# Patient Record
Sex: Female | Born: 1955 | Race: Black or African American | Hispanic: No | Smoking: Former smoker
Health system: Southern US, Community
[De-identification: ages and names within clinical notes are randomized; demographics above are authoritative.]

## PROBLEM LIST (undated history)

## (undated) DIAGNOSIS — I739 Peripheral vascular disease, unspecified: Secondary | ICD-10-CM

## (undated) DIAGNOSIS — E785 Hyperlipidemia, unspecified: Secondary | ICD-10-CM

## (undated) DIAGNOSIS — I471 Supraventricular tachycardia, unspecified: Secondary | ICD-10-CM

## (undated) DIAGNOSIS — I499 Cardiac arrhythmia, unspecified: Secondary | ICD-10-CM

## (undated) DIAGNOSIS — I6523 Occlusion and stenosis of bilateral carotid arteries: Secondary | ICD-10-CM

## (undated) DIAGNOSIS — I70239 Atherosclerosis of native arteries of right leg with ulceration of unspecified site: Secondary | ICD-10-CM

## (undated) DIAGNOSIS — N12 Tubulo-interstitial nephritis, not specified as acute or chronic: Secondary | ICD-10-CM

## (undated) DIAGNOSIS — I998 Other disorder of circulatory system: Secondary | ICD-10-CM

## (undated) DIAGNOSIS — R55 Syncope and collapse: Secondary | ICD-10-CM

## (undated) DIAGNOSIS — I70269 Atherosclerosis of native arteries of extremities with gangrene, unspecified extremity: Secondary | ICD-10-CM

## (undated) DIAGNOSIS — R002 Palpitations: Secondary | ICD-10-CM

## (undated) DIAGNOSIS — I10 Essential (primary) hypertension: Secondary | ICD-10-CM

## (undated) HISTORY — PX: ABDOMINAL HYSTERECTOMY: SHX81

## (undated) HISTORY — PX: PARTIAL HYSTERECTOMY: SHX80

---

## 2005-07-21 ENCOUNTER — Emergency Department: Payer: Self-pay | Admitting: General Practice

## 2016-03-07 DIAGNOSIS — R0789 Other chest pain: Secondary | ICD-10-CM | POA: Insufficient documentation

## 2016-03-07 DIAGNOSIS — I10 Essential (primary) hypertension: Secondary | ICD-10-CM | POA: Insufficient documentation

## 2016-03-07 DIAGNOSIS — R55 Syncope and collapse: Secondary | ICD-10-CM | POA: Insufficient documentation

## 2016-05-30 ENCOUNTER — Other Ambulatory Visit: Payer: Self-pay | Admitting: Family Medicine

## 2016-05-30 DIAGNOSIS — Z1231 Encounter for screening mammogram for malignant neoplasm of breast: Secondary | ICD-10-CM

## 2017-01-09 ENCOUNTER — Encounter: Payer: Self-pay | Admitting: Emergency Medicine

## 2017-01-09 ENCOUNTER — Emergency Department
Admission: EM | Admit: 2017-01-09 | Discharge: 2017-01-09 | Disposition: A | Discharge status: Home / Self Care | Payer: Self-pay | Attending: Emergency Medicine | Admitting: Emergency Medicine

## 2017-01-09 ENCOUNTER — Emergency Department: Payer: Self-pay

## 2017-01-09 DIAGNOSIS — I10 Essential (primary) hypertension: Secondary | ICD-10-CM | POA: Insufficient documentation

## 2017-01-09 DIAGNOSIS — Y939 Activity, unspecified: Secondary | ICD-10-CM | POA: Insufficient documentation

## 2017-01-09 DIAGNOSIS — Y999 Unspecified external cause status: Secondary | ICD-10-CM | POA: Insufficient documentation

## 2017-01-09 DIAGNOSIS — S62307A Unspecified fracture of fifth metacarpal bone, left hand, initial encounter for closed fracture: Secondary | ICD-10-CM | POA: Insufficient documentation

## 2017-01-09 DIAGNOSIS — W51XXXA Accidental striking against or bumped into by another person, initial encounter: Secondary | ICD-10-CM | POA: Insufficient documentation

## 2017-01-09 DIAGNOSIS — F17210 Nicotine dependence, cigarettes, uncomplicated: Secondary | ICD-10-CM | POA: Insufficient documentation

## 2017-01-09 DIAGNOSIS — Y929 Unspecified place or not applicable: Secondary | ICD-10-CM | POA: Insufficient documentation

## 2017-01-09 HISTORY — DX: Essential (primary) hypertension: I10

## 2017-01-09 HISTORY — DX: Syncope and collapse: R55

## 2017-01-09 MED ORDER — IBUPROFEN 600 MG PO TABS: 600.0000 mg | ORAL_TABLET | Freq: Four times a day (QID) | ORAL | 0 refills | Status: DC | PRN
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
## 2017-02-15 DIAGNOSIS — S62338A Displaced fracture of neck of other metacarpal bone, initial encounter for closed fracture: Secondary | ICD-10-CM | POA: Insufficient documentation

## 2018-07-03 IMAGING — CR DG HAND COMPLETE 3+V*L*
1 series · 3 of 3 positions shown · non-contrast
Comparison: None.

CLINICAL DATA: Left hand pain after injury 2 days ago.

EXAM:
LEFT HAND - COMPLETE 3+ VIEW

[Series 1: x hand pa left · 0.14mm/px · 3 of 3 slices shown]
[im 1/3]
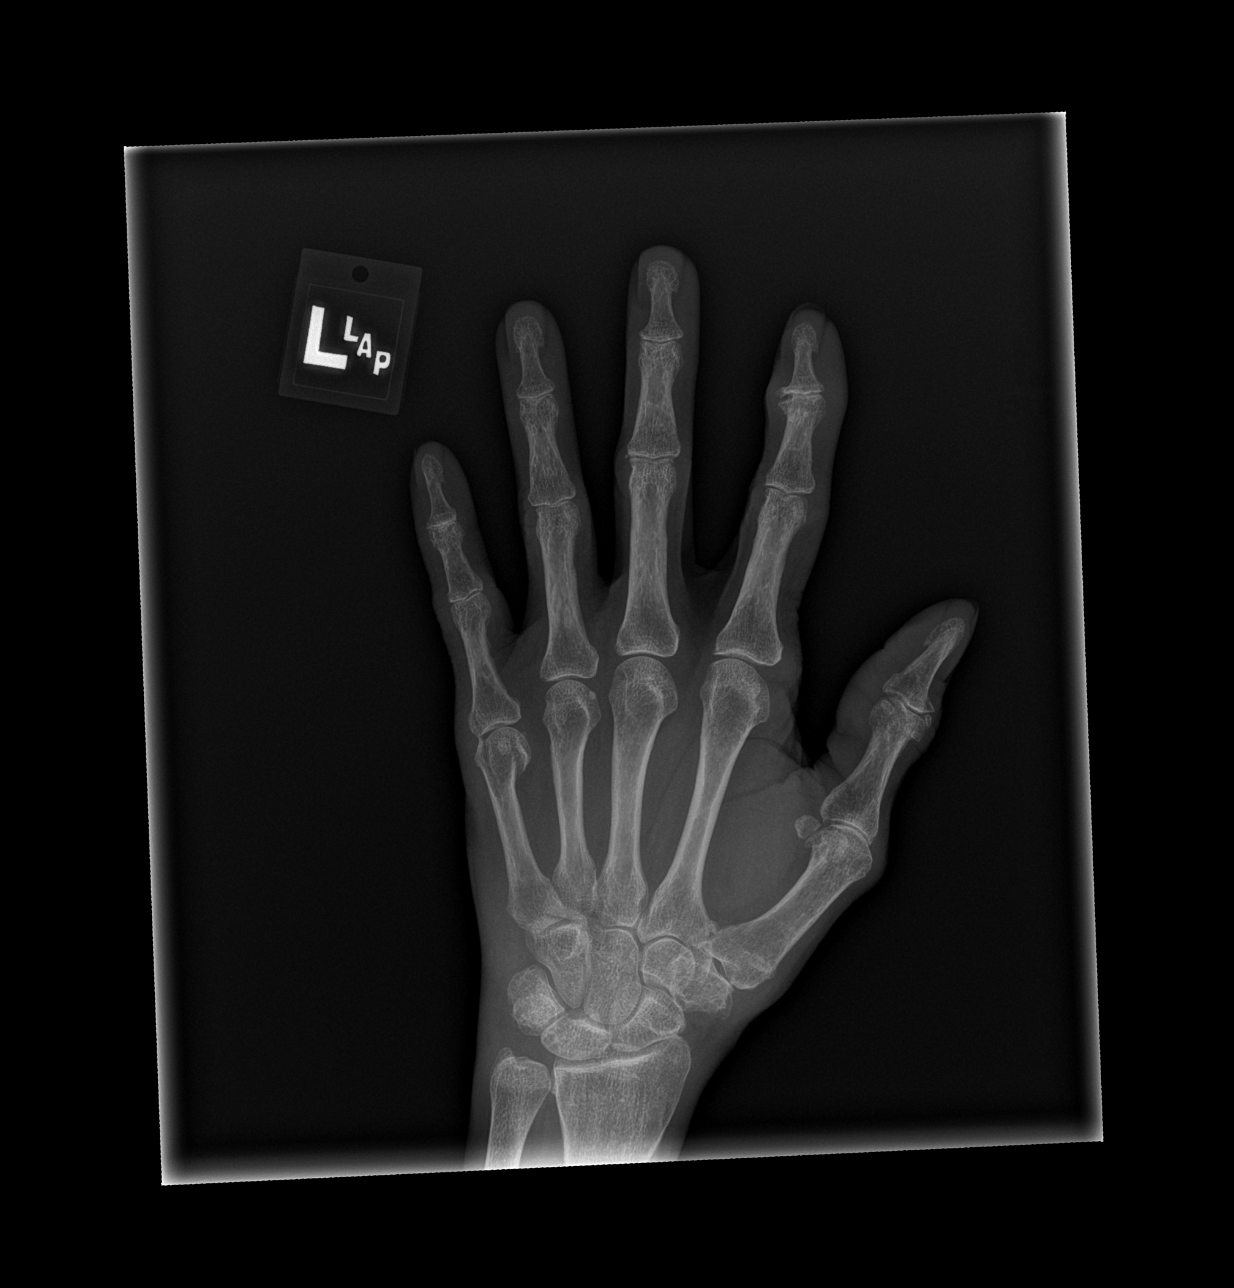
[im 2/3]
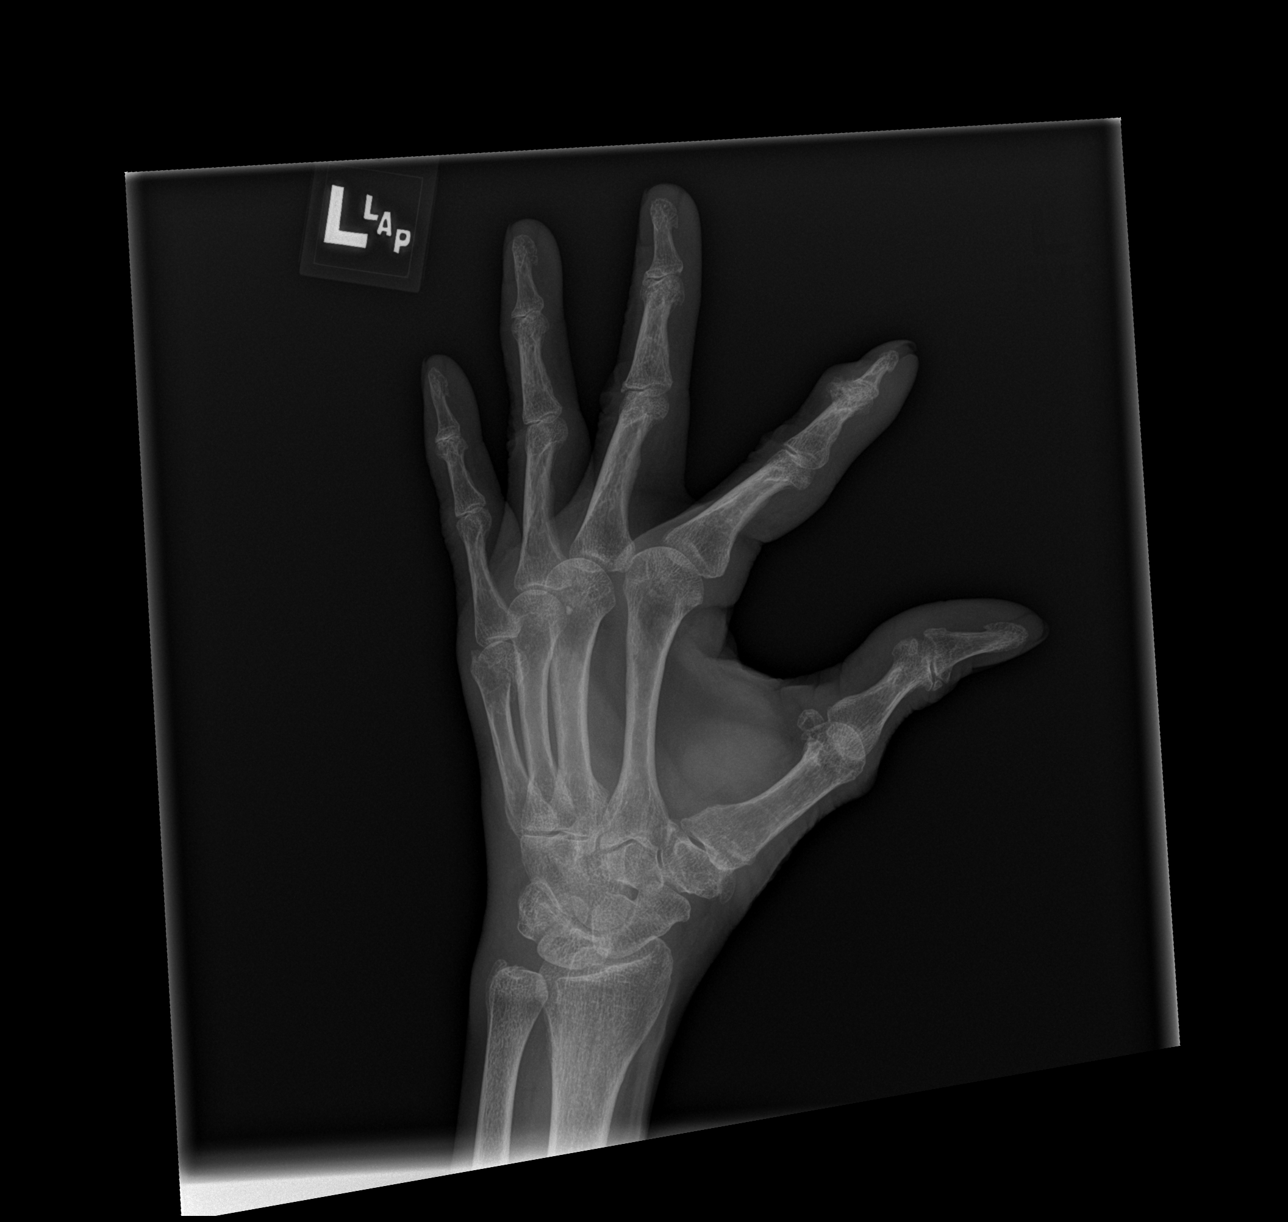
[im 3/3]
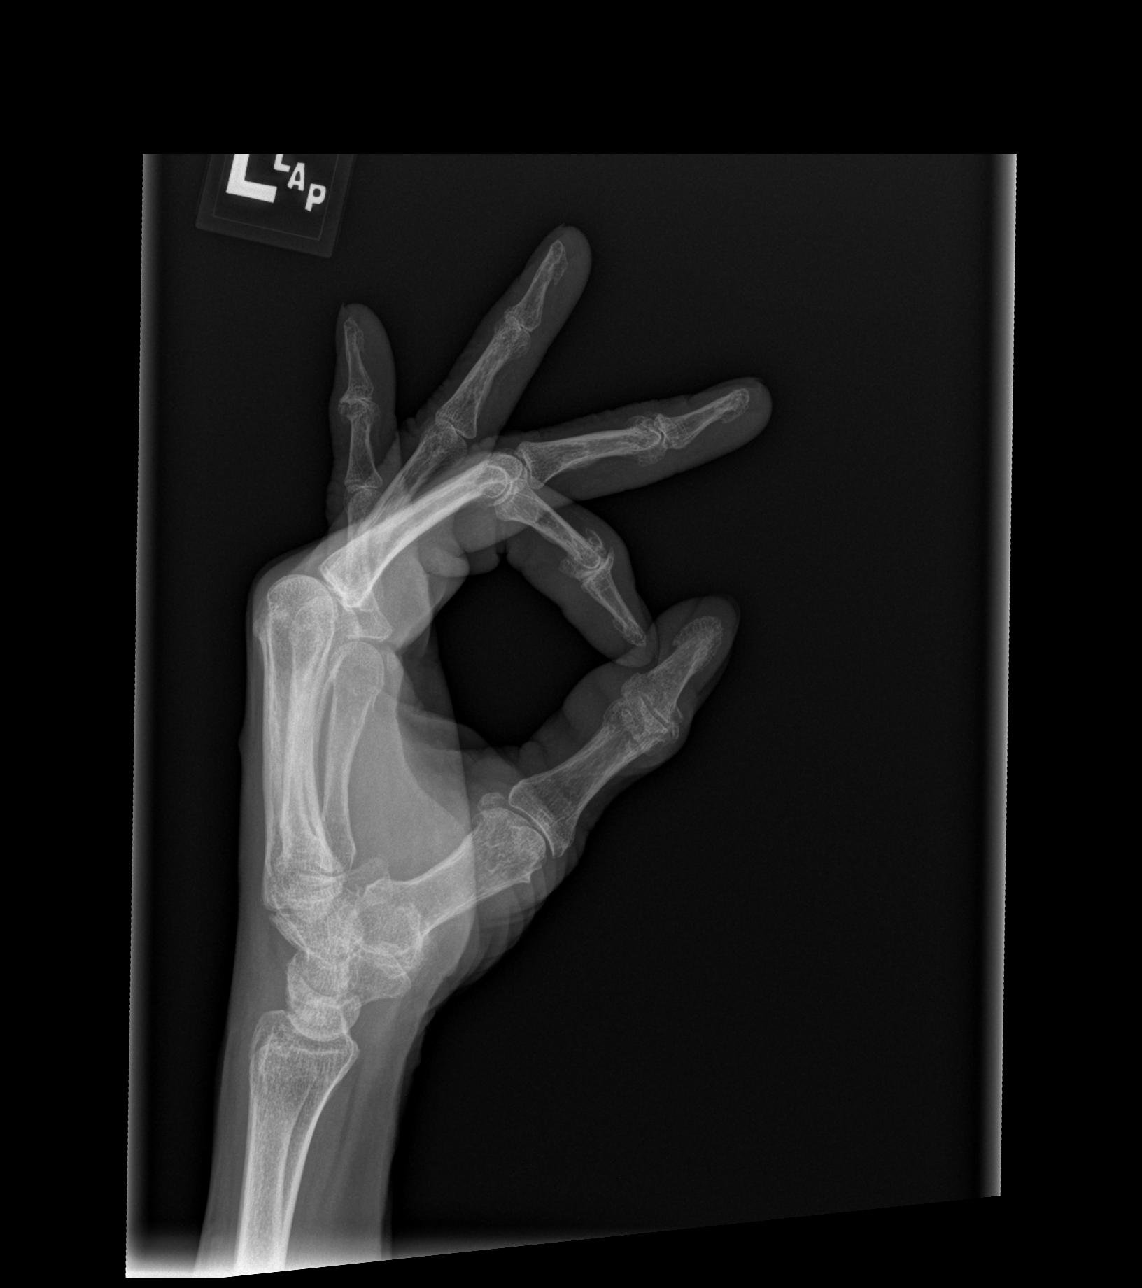

[3 of 3 positions shown; findings below may reference images not displayed]

FINDINGS: Moderately displaced fracture is seen involving the distal portion
of the fifth metacarpal. Narrowing and osteophyte formation is seen
involving the distal interphalangeal joint of the index finger
suggesting osteoarthritis. No soft tissue abnormality is noted.
IMPRESSION: Moderately displaced distal fifth metacarpal fracture.

## 2019-09-05 ENCOUNTER — Other Ambulatory Visit: Payer: Self-pay

## 2019-09-05 ENCOUNTER — Emergency Department
Admission: EM | Admit: 2019-09-05 | Discharge: 2019-09-05 | Disposition: A | Discharge status: Home / Self Care | Payer: Medicare Other | Attending: Emergency Medicine | Admitting: Emergency Medicine

## 2019-09-05 DIAGNOSIS — I10 Essential (primary) hypertension: Secondary | ICD-10-CM | POA: Insufficient documentation

## 2019-09-05 DIAGNOSIS — R55 Syncope and collapse: Secondary | ICD-10-CM | POA: Insufficient documentation

## 2019-09-05 DIAGNOSIS — Z79899 Other long term (current) drug therapy: Secondary | ICD-10-CM | POA: Diagnosis not present

## 2019-09-05 DIAGNOSIS — F17210 Nicotine dependence, cigarettes, uncomplicated: Secondary | ICD-10-CM | POA: Insufficient documentation

## 2019-09-05 LAB — BASIC METABOLIC PANEL
Anion gap: 8 (ref 5–15)
BUN: 10 mg/dL (ref 8–23)
CO2: 27 mmol/L (ref 22–32)
Calcium: 8.8 mg/dL — ABNORMAL LOW (ref 8.9–10.3)
Chloride: 103 mmol/L (ref 98–111)
Creatinine, Ser: 0.52 mg/dL (ref 0.44–1.00)
GFR calc Af Amer: >60 mL/min (ref >60)
GFR calc non Af Amer: >60 mL/min (ref >60)
Glucose, Bld: 156 mg/dL — ABNORMAL HIGH (ref 70–99)
Potassium: 3.2 mmol/L — ABNORMAL LOW (ref 3.5–5.1)
Sodium: 138 mmol/L (ref 135–145)

## 2019-09-05 LAB — CBC
HCT: 42 % (ref 36.0–46.0)
Hemoglobin: 14 g/dL (ref 12.0–15.0)
MCH: 33.4 pg (ref 26.0–34.0)
MCHC: 33.3 g/dL (ref 30.0–36.0)
MCV: 100.2 fL — ABNORMAL HIGH (ref 80.0–100.0)
Platelets: 275 10*3/uL (ref 150–400)
RBC: 4.19 MIL/uL (ref 3.87–5.11)
RDW: 14.5 % (ref 11.5–15.5)
WBC: 6.5 10*3/uL (ref 4.0–10.5)
nRBC: 0 % (ref 0.0–0.2)

## 2019-09-05 MED ORDER — POTASSIUM CHLORIDE 20 MEQ/15ML (10%) PO SOLN
40.0000 meq | Freq: Once | ORAL | Status: AC
  Administered 2019-09-05: 08:00:00 40 meq via ORAL
  Filled 2019-09-05: qty 30

## 2019-09-05 MED ORDER — SODIUM CHLORIDE 0.9 % IV BOLUS
500.0000 mL | Freq: Once | INTRAVENOUS | Status: AC
  Administered 2019-09-05: 500 mL via INTRAVENOUS

## 2019-09-05 NOTE — Discharge Instructions (Addendum)

## 2019-09-05 NOTE — ED Provider Notes (Signed)
[facility] Emergency Department Provider Note   ____________________________________________   First MD Initiated Contact with Patient 09/05/19 [phone]     (approximate)  I have reviewed the triage vital signs and the nursing notes.   HISTORY  Chief Complaint Loss of Consciousness    HPI Yvonne [name] is a 64 y.o. female at about 1 AM, she got up from her chair to use the restroom as she was in the restroom she got lightheaded and passed out briefly.  She reports she did not injure her head.  She  did suffer a small abrasion to her right knee.  She came in today as this is happened a few times in the past and she reports she is like to figure out what might be causing it  She has had a few episodes over the distant past where she is passed out just the same.  She woke up on the floor oriented today.  She reports that she drank about 2-1/2 beers in her chair, then got up to use the bathroom and became lightheaded and passed out  There is no chest pain no recent illness no fevers no chills.  She is seeing her doctor who is ordered an ultrasound that she had done yesterday due to some thickening of lymph nodes or other on the left side of her neck.  No headaches.  No injuries except abrasion on the right knee.  Past Medical History:  Diagnosis Date  . Hypertension   . Syncope     There are no problems to display for this patient.   No past surgical history on file.  Prior to Admission medications   Medication Sig Start Date End Date Taking? Authorizing Provider  hydrochlorothiazide (HYDRODIURIL) 25 MG tablet Take 25 mg by mouth daily.    [provider]  ibuprofen (ADVIL,MOTRIN) 600 MG tablet Take 1 tablet (600 mg total) by mouth every 6 (six) hours as needed. 01/09/17   Enid Derry, PA-C    Allergies Patient has no known allergies.  No family history on file.  Social History Social History   Tobacco Use  . Smoking status: Current  Every Day Smoker    Packs/day: 1.00    Types: Cigarettes  Substance Use Topics  . Alcohol use: Not on file  . Drug use: Not on file  Does use alcohol occasionally, about 2-1/2 beers last night before bed  Review of Systems Constitutional: No fever/chills Eyes: No visual changes. ENT: No sore throat.  No neck pain except over the left side of her neck where she has an area of that her doctors been checking out and they did an ultrasound of it yesterday Cardiovascular: Denies chest pain. Respiratory: Denies shortness of breath. Gastrointestinal: No abdominal pain.   Genitourinary: Negative for dysuria. Musculoskeletal: Negative for back pain. Skin: Negative for rash. Neurological: Negative for headaches, areas of focal weakness or numbness.  She does however occasionally get left-sided headaches that her doctor is aware of not currently having any.    ____________________________________________   PHYSICAL EXAM:  VITAL SIGNS: ED Triage Vitals  Enc Vitals Group     BP 09/05/19 0650 (!) 157/79     Pulse Rate 09/05/19 0650 64     Resp 09/05/19 0650 18     Temp 09/05/19 0650 98.4 F (36.9 C)     Temp Source 09/05/19 0650 Oral     SpO2 09/05/19 0650 100 %     Weight 09/05/19 0652 113 lb (51.3  kg)     Height 09/05/19 0652 5\' 3"  (1.6 m)     Head Circumference --      Peak Flow --      Pain Score 09/05/19 0652 2     Pain Loc --      Pain Edu? --      Excl. in [place]? --     Constitutional: Alert and oriented. Well appearing and in no acute distress. Eyes: Conjunctivae are normal. Head: Atraumatic.  She does have a slightly firm nodular region over the left lateral neck question if it could be a lymph node, but nonetheless the patient reports has been there for some time and her doctor is evaluating it and actually had an ultrasound of the area yesterday for which she supposed to get the results this coming week.  No midline cervical tenderness Nose: No  congestion/rhinnorhea. Mouth/Throat: Mucous membranes are slightly dry. Neck: No stridor.  Cardiovascular: Normal rate, regular rhythm. Grossly normal heart sounds.  Good peripheral circulation. Respiratory: Normal respiratory effort.  No retractions. Lungs CTAB. Gastrointestinal: Soft and nontender. No distention. Musculoskeletal: No lower extremity tenderness nor edema. Neurologic:  Normal speech and language. No gross focal neurologic deficits are appreciated.  Skin:  Skin is warm, dry and intact. No rash noted. Psychiatric: Mood and affect are normal. Speech and behavior are normal.  ____________________________________________   LABS (all labs ordered are listed, but only abnormal results are displayed)  Labs Reviewed  BASIC METABOLIC PANEL - Abnormal; Notable for the following components:      Result Value   Potassium 3.2 (*)    Glucose, Bld 156 (*)    Calcium 8.8 (*)    All other components within normal limits  CBC - Abnormal; Notable for the following components:   MCV 100.2 (*)    All other components within normal limits  URINALYSIS, COMPLETE (UACMP) WITH MICROSCOPIC   ____________________________________________  EKG  Reviewed interprinted by me at 7 AM Heart rate 65 QRs 99 QTc 470 Normal sinus rhythm, possible mild LVH.  No evidence of acute ischemia. ____________________________________________  RADIOLOGY  No results found.   ____________________________________________   PROCEDURES  Procedure(s) performed: 3-lead  .1-3 Lead EKG Interpretation Performed by: Delman Kitten, MD Authorized by: Delman Kitten, MD     Interpretation: normal     ECG rate:  70   ECG rate assessment: normal     Rhythm: sinus rhythm     Ectopy: none     Conduction: normal      Critical Care performed: No  ____________________________________________   INITIAL IMPRESSION / ASSESSMENT AND PLAN / ED COURSE  Pertinent labs & imaging results that were available during  my care of the patient were reviewed by me and considered in my medical decision making (see chart for details).   Patient returns for evaluation after passing out.  Patient's is in the context of getting up to urinate after drinking 2 or 3 beers last night.  I suspect based on the clinical history she gives that likely some element of vasovagal or dehydration is involved.  She does report she is had a few other syncopal episodes always occurring after drinking a couple of beers.  She does not drink daily though  She denies any associated chest pain.  There was no head injury.  No numbness no weakness no associated neurologic symptoms.  Very reassuring clinical examination at this time.  No evidence of ACS by clinical exam or history and I do not believe  a troponin would be beneficial given lack of any cardiac symptoms    ----------------------------------------- 9:54 AM on 09/05/2019 -----------------------------------------  Patient ambulatory without distress.  Feels well no ongoing lightheadedness.  No symptoms at this time.  Return precautions and treatment recommendations and follow-up discussed with the patient who is agreeable with the plan.  Patient agreeable to follow-up with her primary and also my recommendation to follow-up with cardiology for further evaluation including possible heart monitoring ____________________________________________   FINAL CLINICAL IMPRESSION(S) / ED DIAGNOSES  Final diagnoses:  Syncope and collapse        Note:  This document was prepared using Dragon voice recognition software and may include unintentional dictation errors       Sharyn Creamer, MD 09/05/19 [phone]

## 2019-09-05 NOTE — ED Triage Notes (Signed)
Patient reports passed out this morning.  Reports history of this but has never been told what causes it.

## 2019-10-05 DIAGNOSIS — I10 Essential (primary) hypertension: Secondary | ICD-10-CM | POA: Insufficient documentation

## 2019-11-12 DIAGNOSIS — R002 Palpitations: Secondary | ICD-10-CM | POA: Insufficient documentation

## 2019-11-12 DIAGNOSIS — I471 Supraventricular tachycardia: Secondary | ICD-10-CM | POA: Insufficient documentation

## 2019-11-12 DIAGNOSIS — I6523 Occlusion and stenosis of bilateral carotid arteries: Secondary | ICD-10-CM | POA: Insufficient documentation

## 2019-12-02 ENCOUNTER — Encounter: Payer: Self-pay | Admitting: Emergency Medicine

## 2019-12-02 ENCOUNTER — Emergency Department
Admission: EM | Admit: 2019-12-02 | Discharge: 2019-12-02 | Disposition: A | Discharge status: Home / Self Care | Payer: Medicare Other | Attending: Emergency Medicine | Admitting: Emergency Medicine

## 2019-12-02 ENCOUNTER — Emergency Department: Payer: Medicare Other

## 2019-12-02 ENCOUNTER — Other Ambulatory Visit: Payer: Self-pay

## 2019-12-02 DIAGNOSIS — Y999 Unspecified external cause status: Secondary | ICD-10-CM | POA: Insufficient documentation

## 2019-12-02 DIAGNOSIS — Y929 Unspecified place or not applicable: Secondary | ICD-10-CM | POA: Diagnosis not present

## 2019-12-02 DIAGNOSIS — R55 Syncope and collapse: Secondary | ICD-10-CM | POA: Diagnosis not present

## 2019-12-02 DIAGNOSIS — I10 Essential (primary) hypertension: Secondary | ICD-10-CM | POA: Insufficient documentation

## 2019-12-02 DIAGNOSIS — Y939 Activity, unspecified: Secondary | ICD-10-CM | POA: Insufficient documentation

## 2019-12-02 DIAGNOSIS — W19XXXA Unspecified fall, initial encounter: Secondary | ICD-10-CM | POA: Diagnosis not present

## 2019-12-02 DIAGNOSIS — S0990XA Unspecified injury of head, initial encounter: Secondary | ICD-10-CM | POA: Diagnosis present

## 2019-12-02 DIAGNOSIS — S0081XA Abrasion of other part of head, initial encounter: Secondary | ICD-10-CM | POA: Diagnosis not present

## 2019-12-02 DIAGNOSIS — F17210 Nicotine dependence, cigarettes, uncomplicated: Secondary | ICD-10-CM | POA: Diagnosis not present

## 2019-12-02 LAB — BASIC METABOLIC PANEL WITH GFR
Anion gap: 10 (ref 5–15)
BUN: 14 mg/dL (ref 8–23)
CO2: 28 mmol/L (ref 22–32)
Calcium: 9 mg/dL (ref 8.9–10.3)
Chloride: 98 mmol/L (ref 98–111)
Creatinine, Ser: 0.61 mg/dL (ref 0.44–1.00)
GFR calc Af Amer: >60 mL/min
GFR calc non Af Amer: >60 mL/min
Glucose, Bld: 107 mg/dL — ABNORMAL HIGH (ref 70–99)
Potassium: 4.1 mmol/L (ref 3.5–5.1)
Sodium: 136 mmol/L (ref 135–145)

## 2019-12-02 LAB — CBC
HCT: 38.1 % (ref 36.0–46.0)
Hemoglobin: 13.1 g/dL (ref 12.0–15.0)
MCH: 34.2 pg — ABNORMAL HIGH (ref 26.0–34.0)
MCHC: 34.4 g/dL (ref 30.0–36.0)
MCV: 99.5 fL (ref 80.0–100.0)
Platelets: 266 K/uL (ref 150–400)
RBC: 3.83 MIL/uL — ABNORMAL LOW (ref 3.87–5.11)
RDW: 13.2 % (ref 11.5–15.5)
WBC: 6.1 K/uL (ref 4.0–10.5)
nRBC: 0 % (ref 0.0–0.2)

## 2019-12-02 MED ORDER — SODIUM CHLORIDE 0.9% FLUSH: 3.0000 mL | Freq: Once | INTRAVENOUS | Status: DC

## 2019-12-02 NOTE — ED Triage Notes (Signed)
C/O left sided headache since Saturday.  Patient states she had a syncopal episode Saturday and hit head.  C/O headache since fall.  Patient states she has history of syncope.  Patient is AAOx3.  Skin warm and dry.  MAE equally and strong.  Reports history of left sided headaches as well.  Speech clear.Marland Kitchen NAD

## 2019-12-02 NOTE — ED Notes (Signed)
Patient transported to CT 

## 2019-12-02 NOTE — ED Notes (Signed)
Pt states she had a syncopal episode in her gravel driveway Saturday and has abrasions to her nose and lac to inside of lower lip, states she has been having a HA since the fall. Denies any dizziness or other sx. States she has a hx of syncope and it seems to be more frequent, states she see a doctor about the episodes. Pt is a/ox4 on arrival.

## 2019-12-02 NOTE — ED Provider Notes (Signed)
[facility] Emergency Department Provider Note   ____________________________________________    I have reviewed the triage vital signs and the nursing notes.   HISTORY  Chief Complaint Headache     HPI Yvonne [name] is a 64 y.o. female who presents with complaints of headache.  Patient reports she had a syncopal episode several days ago and fell and hit the front of her head and face.  Patient reports she has been having syncopal episodes for years that seem to come out of the blue.  She has been evaluated by cardiology for this in the past with no clear etiology.  She is here for evaluation because of frontal mild headache, she did suffer some bruising and abrasions to her face and nose.  She is not on blood thinners.  No neuro deficits.  Past Medical History:  Diagnosis Date  . Hypertension   . Syncope     There are no problems to display for this patient.   History reviewed. No pertinent surgical history.  Prior to Admission medications   Medication Sig Start Date End Date Taking? Authorizing Provider  hydrochlorothiazide (HYDRODIURIL) 25 MG tablet Take 25 mg by mouth daily.    [provider]  ibuprofen (ADVIL,MOTRIN) 600 MG tablet Take 1 tablet (600 mg total) by mouth every 6 (six) hours as needed. 01/09/17   Enid Derry, PA-C     Allergies Patient has no known allergies.  No family history on file.  Social History Social History   Tobacco Use  . Smoking status: Current Every Day Smoker    Packs/day: 1.00    Types: Cigarettes  . Smokeless tobacco: Never Used  Substance Use Topics  . Alcohol use: Not on file  . Drug use: Not on file    Review of Systems  Constitutional: No fever/chills Eyes: No visual changes.  ENT: No neck pain Cardiovascular: Denies chest pain. Respiratory: Denies shortness of breath. Gastrointestinal: No abdominal pain.  No nausea, no vomiting.   Genitourinary: Negative for  dysuria. Musculoskeletal: Negative for back pain. Skin: Abrasions to the face Neurological: Negative for headaches or weakness   ____________________________________________   PHYSICAL EXAM:  VITAL SIGNS: ED Triage Vitals  Enc Vitals Group     BP 12/02/19 1053 (!) 143/60     Pulse Rate 12/02/19 1053 (!) 54     Resp 12/02/19 1053 16     Temp 12/02/19 1053 98.6 F (37 C)     Temp Source 12/02/19 1053 Oral     SpO2 12/02/19 1053 98 %     Weight --      Height --      Head Circumference --      Peak Flow --      Pain Score 12/02/19 1050 4     Pain Loc --      Pain Edu? --      Excl. in GC? --     Constitutional: Alert and oriented.   Nose: Nasal bones are aligned, abrasion to the left bridge of the nose, no septal hematoma Mouth/Throat: Mucous membranes are moist.   Neck: No vertebral has palpation Cardiovascular: Normal rate, regular rhythm. Peri [place] peripheral circulation. Respiratory: Normal respiratory effort.  No retractions. Gastrointestinal: Soft and nontender. No distention.    Musculoskeletal: No lower extremity tenderness nor edema.  Warm and well perfused Neurologic:  Normal speech and language. No gross focal neurologic deficits are appreciated.  Skin:  Skin is warm, dry  Psychiatric: Mood  and affect are normal. Speech and behavior are normal.  ____________________________________________   LABS (all labs ordered are listed, but only abnormal results are displayed)  Labs Reviewed  BASIC METABOLIC PANEL - Abnormal; Notable for the following components:      Result Value   Glucose, Bld 107 (*)    All other components within normal limits  CBC - Abnormal; Notable for the following components:   RBC 3.83 (*)    MCH 34.2 (*)    All other components within normal limits  URINALYSIS, COMPLETE (UACMP) WITH MICROSCOPIC  CBG MONITORING, ED   ____________________________________________  EKG  ED ECG REPORT I, Jene Every, the attending physician,  personally viewed and interpreted this ECG.  Date: 12/02/2019  Rhythm: normal sinus rhythm QRS Axis: normal Intervals: normal ST/T Wave abnormalities: normal Narrative Interpretation: no evidence of acute ischemia  ____________________________________________  RADIOLOGY  CT head no acute abnormality ____________________________________________   PROCEDURES  Procedure(s) performed: No  Procedures   Critical Care performed: No ____________________________________________   INITIAL IMPRESSION / ASSESSMENT AND PLAN / ED COURSE  Pertinent labs & imaging results that were available during my care of the patient were reviewed by me and considered in my medical decision making (see chart for details).  Patient presents after fall from syncopal episode.  Patient reports long history of the syncopal episodes, encouraged her to have reevaluation with cardiology given that she notes they seem to be becoming more frequent.  Here for continued mild headache status post fall.  CT scanning is reassuring.  No additional symptoms of concussion.  Recommend symptomatic care, outpatient follow-up as needed    ____________________________________________   FINAL CLINICAL IMPRESSION(S) / ED DIAGNOSES  Final diagnoses:  Injury of head, initial encounter  Facial abrasion, initial encounter  Syncope, unspecified syncope type        Note:  This document was prepared using Dragon voice recognition software and may include unintentional dictation errors.   Jene Every, MD 12/02/19 [phone]

## 2020-09-20 DIAGNOSIS — E782 Mixed hyperlipidemia: Secondary | ICD-10-CM | POA: Insufficient documentation

## 2020-10-31 ENCOUNTER — Other Ambulatory Visit: Payer: Self-pay | Admitting: Family Medicine

## 2020-10-31 DIAGNOSIS — Z1231 Encounter for screening mammogram for malignant neoplasm of breast: Secondary | ICD-10-CM

## 2021-03-20 ENCOUNTER — Other Ambulatory Visit: Payer: Self-pay

## 2021-03-20 ENCOUNTER — Observation Stay
Admission: EM | Admit: 2021-03-20 | Discharge: 2021-03-21 | Disposition: A | Discharge status: Home / Self Care | Payer: Medicare Other | Attending: Internal Medicine | Admitting: Internal Medicine

## 2021-03-20 ENCOUNTER — Emergency Department: Payer: Medicare Other

## 2021-03-20 DIAGNOSIS — R1011 Right upper quadrant pain: Secondary | ICD-10-CM | POA: Diagnosis present

## 2021-03-20 DIAGNOSIS — F17210 Nicotine dependence, cigarettes, uncomplicated: Secondary | ICD-10-CM | POA: Diagnosis not present

## 2021-03-20 DIAGNOSIS — R0602 Shortness of breath: Secondary | ICD-10-CM | POA: Diagnosis not present

## 2021-03-20 DIAGNOSIS — N12 Tubulo-interstitial nephritis, not specified as acute or chronic: Secondary | ICD-10-CM

## 2021-03-20 DIAGNOSIS — Z79899 Other long term (current) drug therapy: Secondary | ICD-10-CM | POA: Insufficient documentation

## 2021-03-20 DIAGNOSIS — R6520 Severe sepsis without septic shock: Secondary | ICD-10-CM | POA: Insufficient documentation

## 2021-03-20 DIAGNOSIS — I10 Essential (primary) hypertension: Secondary | ICD-10-CM | POA: Insufficient documentation

## 2021-03-20 DIAGNOSIS — Z20822 Contact with and (suspected) exposure to covid-19: Secondary | ICD-10-CM | POA: Diagnosis not present

## 2021-03-20 DIAGNOSIS — N10 Acute tubulo-interstitial nephritis: Principal | ICD-10-CM | POA: Insufficient documentation

## 2021-03-20 DIAGNOSIS — A419 Sepsis, unspecified organism: Secondary | ICD-10-CM | POA: Insufficient documentation

## 2021-03-20 LAB — COMPREHENSIVE METABOLIC PANEL
ALT: 23 U/L (ref 0–44)
AST: 24 U/L (ref 15–41)
Albumin: 3.7 g/dL (ref 3.5–5.0)
Alkaline Phosphatase: 83 U/L (ref 38–126)
Anion gap: 12 (ref 5–15)
BUN: 8 mg/dL (ref 8–23)
CO2: 27 mmol/L (ref 22–32)
Calcium: 8.8 mg/dL — ABNORMAL LOW (ref 8.9–10.3)
Chloride: 98 mmol/L (ref 98–111)
Creatinine, Ser: 0.63 mg/dL (ref 0.44–1.00)
GFR, Estimated: >60 mL/min (ref >60)
Glucose, Bld: 116 mg/dL — ABNORMAL HIGH (ref 70–99)
Potassium: 3.3 mmol/L — ABNORMAL LOW (ref 3.5–5.1)
Sodium: 137 mmol/L (ref 135–145)
Total Bilirubin: 1 mg/dL (ref 0.3–1.2)
Total Protein: 7.8 g/dL (ref 6.5–8.1)

## 2021-03-20 LAB — CBC WITH DIFFERENTIAL/PLATELET
Abs Immature Granulocytes: 0.1 10*3/uL — ABNORMAL HIGH (ref 0.00–0.07)
Basophils Absolute: 0 10*3/uL (ref 0.0–0.1)
Basophils Relative: 0 %
Eosinophils Absolute: 0 10*3/uL (ref 0.0–0.5)
Eosinophils Relative: 0 %
HCT: 39 % (ref 36.0–46.0)
Hemoglobin: 13.6 g/dL (ref 12.0–15.0)
Immature Granulocytes: 1 %
Lymphocytes Relative: 11 %
Lymphs Abs: 1.6 10*3/uL (ref 0.7–4.0)
MCH: 35.2 pg — ABNORMAL HIGH (ref 26.0–34.0)
MCHC: 34.9 g/dL (ref 30.0–36.0)
MCV: 101 fL — ABNORMAL HIGH (ref 80.0–100.0)
Monocytes Absolute: 1.5 10*3/uL — ABNORMAL HIGH (ref 0.1–1.0)
Monocytes Relative: 10 %
Neutro Abs: 11.8 10*3/uL — ABNORMAL HIGH (ref 1.7–7.7)
Neutrophils Relative %: 78 %
Platelets: 197 10*3/uL (ref 150–400)
RBC: 3.86 MIL/uL — ABNORMAL LOW (ref 3.87–5.11)
RDW: 14.5 % (ref 11.5–15.5)
WBC: 15.1 10*3/uL — ABNORMAL HIGH (ref 4.0–10.5)
nRBC: 0 % (ref 0.0–0.2)

## 2021-03-20 LAB — URINALYSIS, ROUTINE W REFLEX MICROSCOPIC
Bilirubin Urine: NEGATIVE
Glucose, UA: NEGATIVE mg/dL
Ketones, ur: NEGATIVE mg/dL
Nitrite: NEGATIVE
Protein, ur: 100 mg/dL — AB
RBC / HPF: >50 RBC/hpf — ABNORMAL HIGH (ref 0–5)
Specific Gravity, Urine: 1.017 (ref 1.005–1.030)
WBC, UA: >50 WBC/hpf — ABNORMAL HIGH (ref 0–5)
pH: 7 (ref 5.0–8.0)

## 2021-03-20 LAB — LACTIC ACID, PLASMA
Lactic Acid, Venous: 2 mmol/L (ref 0.5–1.9)
Lactic Acid, Venous: 2.3 mmol/L (ref 0.5–1.9)
Lactic Acid, Venous: 2.5 mmol/L (ref 0.5–1.9)
Lactic Acid, Venous: 1.6 mmol/L (ref 0.5–1.9)

## 2021-03-20 LAB — RESP PANEL BY RT-PCR (FLU A&B, COVID) ARPGX2
Influenza A by PCR: NEGATIVE
Influenza B by PCR: NEGATIVE
SARS Coronavirus 2 by RT PCR: NEGATIVE

## 2021-03-20 MED ORDER — HYDRALAZINE HCL 20 MG/ML IJ SOLN: 10.0000 mg | INTRAMUSCULAR | Status: DC | PRN

## 2021-03-20 MED ORDER — MORPHINE SULFATE (PF) 2 MG/ML IV SOLN
2.0000 mg | INTRAVENOUS | Status: DC | PRN
  Administered 2021-03-20 – 2021-03-21 (×2): 2 mg via INTRAVENOUS
  Filled 2021-03-20 (×2): qty 1

## 2021-03-20 MED ORDER — LACTATED RINGERS IV SOLN: INTRAVENOUS | Status: DC

## 2021-03-20 MED ORDER — POTASSIUM CHLORIDE CRYS ER 20 MEQ PO TBCR
40.0000 meq | EXTENDED_RELEASE_TABLET | Freq: Once | ORAL | Status: AC
  Administered 2021-03-20: 40 meq via ORAL
  Filled 2021-03-20: qty 2

## 2021-03-20 MED ORDER — LACTATED RINGERS IV BOLUS
1000.0000 mL | Freq: Once | INTRAVENOUS | Status: AC
  Administered 2021-03-20: 1000 mL via INTRAVENOUS

## 2021-03-20 MED ORDER — OXYCODONE HCL 5 MG PO TABS
5.0000 mg | ORAL_TABLET | ORAL | Status: DC | PRN
  Administered 2021-03-20 – 2021-03-21 (×2): 5 mg via ORAL
  Filled 2021-03-20 (×2): qty 1

## 2021-03-20 MED ORDER — ACETAMINOPHEN 325 MG PO TABS
650.0000 mg | ORAL_TABLET | Freq: Once | ORAL | Status: AC
  Administered 2021-03-20: 650 mg via ORAL
  Filled 2021-03-20: qty 2

## 2021-03-20 MED ORDER — LACTATED RINGERS IV BOLUS (SEPSIS): 1000.0000 mL | Freq: Once | INTRAVENOUS | Status: DC

## 2021-03-20 MED ORDER — HYDROCHLOROTHIAZIDE 25 MG PO TABS: 25.0000 mg | ORAL_TABLET | Freq: Every day | ORAL | Status: DC

## 2021-03-20 MED ORDER — ENOXAPARIN SODIUM 40 MG/0.4ML IJ SOSY
40.0000 mg | PREFILLED_SYRINGE | INTRAMUSCULAR | Status: DC
  Administered 2021-03-20: 40 mg via SUBCUTANEOUS
  Filled 2021-03-20: qty 0.4

## 2021-03-20 MED ORDER — TRAZODONE HCL 50 MG PO TABS: 25.0000 mg | ORAL_TABLET | Freq: Every evening | ORAL | Status: DC | PRN

## 2021-03-20 MED ORDER — SODIUM CHLORIDE 0.9 % IV SOLN
2.0000 g | INTRAVENOUS | Status: DC
  Administered 2021-03-20: 2 g via INTRAVENOUS
  Filled 2021-03-20: qty 20

## 2021-03-20 MED ORDER — ACETAMINOPHEN 325 MG RE SUPP: 650.0000 mg | Freq: Four times a day (QID) | RECTAL | Status: DC | PRN

## 2021-03-20 MED ORDER — SODIUM CHLORIDE 0.9 % IV BOLUS
1000.0000 mL | Freq: Once | INTRAVENOUS | Status: AC
  Administered 2021-03-20: 1000 mL via INTRAVENOUS

## 2021-03-20 MED ORDER — ONDANSETRON HCL 4 MG PO TABS: 4.0000 mg | ORAL_TABLET | Freq: Four times a day (QID) | ORAL | Status: DC | PRN

## 2021-03-20 MED ORDER — ONDANSETRON HCL 4 MG/2ML IJ SOLN
4.0000 mg | Freq: Four times a day (QID) | INTRAMUSCULAR | Status: DC | PRN
  Administered 2021-03-21: 4 mg via INTRAVENOUS
  Filled 2021-03-20: qty 2

## 2021-03-20 MED ORDER — ACETAMINOPHEN 325 MG PO TABS: 650.0000 mg | ORAL_TABLET | Freq: Four times a day (QID) | ORAL | Status: DC | PRN

## 2021-03-20 MED ORDER — SENNOSIDES-DOCUSATE SODIUM 8.6-50 MG PO TABS: 1.0000 | ORAL_TABLET | Freq: Every evening | ORAL | Status: DC | PRN

## 2021-03-20 MED ORDER — SODIUM CHLORIDE 0.9 % IV SOLN: 2.0000 g | INTRAVENOUS | Status: DC

## 2021-03-20 NOTE — H&P (Signed)
History and Physical    Yvonne [name] KDX:[id number] DOB: 03-28-1956 DOA: 03/20/2021  PCP: Ellene Route  Patient coming from: Home  I have personally briefly reviewed patient's old medical records in [place]  Chief Complaint: Dysuria, right upper quadrant pain, shortness of breath, fever  HPI: Yvonne [name] is a 65 y.o. female with medical history significant of hypertension who presents for evaluation of 3-day history of dysuria, right upper quadrant pain, shortness of breath, fever.  Symptoms of been progressive since that time.  No aggravating or alleviating factors at home.  On presentation patient was noted to be febrile T-max 101.4, hypertensive, otherwise hemodynamically stable.  Mentating clearly.  Laboratory investigation significant for urinalysis consistent with infection.  Follow-up renal stone study significant for asymmetric right-sided fat stranding.  Clinical presentation consistent with acute pyelonephritis.  ED Course: On presentation patient was fluid resuscitated with 2 L crystalloid per sepsis bundle.  CT results as above.  Patient started on maintenance fluids and IV Rocephin by ED provider.  Hospitalist contacted for admission.  Review of Systems: As per HPI otherwise 14 point review of systems negative.    Past Medical History:  Diagnosis Date   Hypertension    Syncope     No past surgical history on file.   reports that she has been smoking cigarettes. She has been smoking an average of 1 pack per day. She has never used smokeless tobacco. No history on file for alcohol use and drug use.  No Known Allergies  No family history on file. No family history of pyelonephritis or urinary tract infection.  Prior to Admission medications   Medication Sig Start Date End Date Taking? Authorizing Provider  hydrochlorothiazide (HYDRODIURIL) 25 MG tablet Take 25 mg by mouth daily.    [provider]  ibuprofen (ADVIL,MOTRIN) 600 MG tablet Take 1  tablet (600 mg total) by mouth every 6 (six) hours as needed. 01/09/17   Laban Emperor, PA-C    Physical Exam: Vitals:   03/20/21 1438 03/20/21 1439  BP:  (!) 153/83  Pulse:  84  Resp:  20  Temp:  (!) 101.4 F (38.6 C)  TempSrc:  Oral  SpO2:  95%  Weight: 48.5 kg   Height: 5' 3" (1.6 m)      Vitals:   03/20/21 1438 03/20/21 1439  BP:  (!) 153/83  Pulse:  84  Resp:  20  Temp:  (!) 101.4 F (38.6 C)  TempSrc:  Oral  SpO2:  95%  Weight: 48.5 kg   Height: 5' 3" (1.6 m)    Constitutional: NAD, calm, comfortable Eyes: PERRL, lids and conjunctivae normal ENMT: Mucous membranes are moist. Normal dentition.  Respiratory: Lungs clear.  Normal work of breathing.  Room air Cardiovascular: Tachycardic, regular rhythm, no murmurs, no pedal edema Abdomen: Soft, nondistended, mild RUQ tenderness, positive bowel sounds Musculoskeletal: No clubbing or cyanosis.  No obvious joint deformity.  Normal muscle tone Skin: no rashes, lesions, ulcers. No induration Neurologic: Cranial nerves grossly intact.  Sensation intact.  Strength 5/5 Psychiatric: Normal judgment and insight. Alert and oriented x 3. Normal mood.    Labs on Admission: I have personally reviewed following labs and imaging studies  CBC: Recent Labs  Lab 03/20/21 1428  WBC 15.1*  NEUTROABS 11.8*  HGB 13.6  HCT 39.0  MCV 101.0*  PLT 976   Basic Metabolic Panel: Recent Labs  Lab 03/20/21 1428  NA 137  K 3.3*  CL 98  CO2 27  GLUCOSE 116*  BUN 8  CREATININE 0.63  CALCIUM 8.8*   GFR: Estimated Creatinine Clearance: 52.2 mL/min (by C-G formula based on SCr of 0.63 mg/dL). Liver Function Tests: Recent Labs  Lab 03/20/21 1428  AST 24  ALT 23  ALKPHOS 83  BILITOT 1.0  PROT 7.8  ALBUMIN 3.7   No results for input(s): LIPASE, AMYLASE in the last 168 hours. No results for input(s): AMMONIA in the last 168 hours. Coagulation Profile: No results for input(s): INR, PROTIME in the last 168 hours. Cardiac  Enzymes: No results for input(s): CKTOTAL, CKMB, CKMBINDEX, TROPONINI in the last 168 hours. BNP (last 3 results) No results for input(s): PROBNP in the last 8760 hours. HbA1C: No results for input(s): HGBA1C in the last 72 hours. CBG: No results for input(s): GLUCAP in the last 168 hours. Lipid Profile: No results for input(s): CHOL, HDL, LDLCALC, TRIG, CHOLHDL, LDLDIRECT in the last 72 hours. Thyroid Function Tests: No results for input(s): TSH, T4TOTAL, FREET4, T3FREE, THYROIDAB in the last 72 hours. Anemia Panel: No results for input(s): VITAMINB12, FOLATE, FERRITIN, TIBC, IRON, RETICCTPCT in the last 72 hours. Urine analysis:    Component Value Date/Time   COLORURINE YELLOW (A) 03/20/2021 1428   APPEARANCEUR CLOUDY (A) 03/20/2021 1428   LABSPEC 1.017 03/20/2021 1428   PHURINE 7.0 03/20/2021 1428   GLUCOSEU NEGATIVE 03/20/2021 1428   HGBUR MODERATE (A) 03/20/2021 1428   BILIRUBINUR NEGATIVE 03/20/2021 [place] 03/20/2021 1428   PROTEINUR 100 (A) 03/20/2021 1428   NITRITE NEGATIVE 03/20/2021 1428   LEUKOCYTESUR LARGE (A) 03/20/2021 1428    Radiological Exams on Admission: DG Chest 2 View  Result Date: 03/20/2021 CLINICAL DATA:  Shortness of breath EXAM: CHEST - 2 VIEW COMPARISON:  None. FINDINGS: The heart size and mediastinal contours are within normal limits. Both lungs are clear. Disc degenerative disease of the thoracic spine. IMPRESSION: No acute abnormality of the lungs. Electronically Signed   By: Delanna Ahmadi M.D.   On: 03/20/2021 15:08   CT Renal Stone Study  Result Date: 03/20/2021 CLINICAL DATA:  Flank pain EXAM: CT ABDOMEN AND PELVIS WITHOUT CONTRAST TECHNIQUE: Multidetector CT imaging of the abdomen and pelvis was performed following the standard protocol without IV contrast. COMPARISON:  None. FINDINGS: Lower chest: No acute abnormality. Hepatobiliary: No focal liver abnormality is seen. No gallstones, gallbladder wall thickening, or biliary  dilatation. Pancreas: Unremarkable. No pancreatic ductal dilatation or surrounding inflammatory changes. Spleen: Normal in size without focal abnormality. Adrenals/Urinary Tract: Bilateral adrenal glands unremarkable. Punctate nonobstructing stone of the left kidney. Mild right greater than left hydronephrosis and hydroureter with asymmetric fat stranding about the right kidney. Ill-defined peripherally calcified low-attenuation areas of the interpolar right kidney. Mildly thick-walled urinary bladder. Stomach/Bowel: Stomach is within normal limits. Appendix appears normal. Diverticulosis. No evidence of bowel wall thickening, distention, or inflammatory changes. Vascular/Lymphatic: Aortic atherosclerosis. No enlarged abdominal or pelvic lymph nodes. Reproductive: Status post hysterectomy. No adnexal masses. Other: No abdominal wall hernia or abnormality. No abdominopelvic ascites. Musculoskeletal: Mild grade 1 anterolisthesis of L4 and L5, likely degenerative. No acute osseous abnormality. IMPRESSION: Asymmetric fat stranding about the right kidney with mild right greater than left hydronephrosis and hydroureter. No obstructing stone visualized. Findings are concerning for pyelonephritis. Ill-defined peripherally calcified low-attenuation area of the interpolar right kidney, possibly a simple renal cyst or calyceal diverticulum, although superinfection cannot be excluded. Contrast-enhanced renal protocol CT or MRI could be performed for further evaluation. Mildly thick-walled urinary bladder, findings can be seen in the  setting of cystitis. Aortic Atherosclerosis (ICD10-I70.0). Electronically Signed   By: Yetta Glassman M.D.   On: 03/20/2021 16:44    EKG: Ordered but not yet completed at time of this note  Assessment/Plan Active Problems:   Pyelonephritis  Acute right-sided pyelonephritis Severe sepsis secondary to above Sepsis criteria met with leukocytosis, fever Severe sepsis criteria met with  elevated lactic acid CT imaging consistent with right-sided pyelonephritis Imaging negative for stones Plan: Placed in observation Rocephin 2 g every 24 hours IV fluid resuscitation Follow blood and urine cultures As needed pain control As needed antiemetics Monitor vitals and fever curve  Essential hypertension Resume home hydrochlorothiazide As needed IV hydralazine  Hypokalemia Potassium 3.3 on admission Replace and follow electrolytes daily  DVT prophylaxis: SQ Lovenox Code Status: Full Family Communication: Relative at bedside 10/24 Disposition Plan: Anticipate return to previous home environment Consults called: None Admission status: Observation, MedSurg   Sidney Ace MD [facility]   If 7PM-7AM, please contact night-coverage   03/20/2021, 5:20 PM

## 2021-03-20 NOTE — Consult Note (Signed)
CODE SEPSIS - PHARMACY COMMUNICATION  **Broad Spectrum Antibiotics should be administered within 1 hour of Sepsis diagnosis**  Time Code Sepsis Called/Page Received: 1504  Antibiotics Ordered: 1504  Time of 1st antibiotic administration: 1515      Sharen Hones ,PharmD, BCPS Clinical Pharmacist  03/20/2021  3:19 PM

## 2021-03-20 NOTE — Sepsis Progress Note (Signed)
Secure chat with bedside nurse regarding need for third lactic acid per sepsis protocol.

## 2021-03-20 NOTE — Sepsis Progress Note (Signed)
Sepsis protocol is being followed by [facility]. 

## 2021-03-20 NOTE — ED Provider Notes (Signed)
[facility] Emergency Department Provider Note    Event Date/Time   First MD Initiated Contact with Patient 03/20/21 1450     (approximate)  I have reviewed the triage vital signs and the nursing notes.   HISTORY  Chief Complaint Abdominal Pain, Flank Pain, Fever, Dysuria, and Shortness of Breath    HPI Yvonne [name] is a 65 y.o. female below listed past medical history presents to the ER for evaluation of generalized malaise chills some dysuria and right-sided abdominal pain for the past 2 days.  Through that she developed a bladder infection.  Denies any history of kidney stones.  Has had some nausea but no vomiting.  Denies any chest pain cough or congestion.  States that she was having chills and rigors this morning.  No recent antibiotics.  No recent urological procedures.  Past Medical History:  Diagnosis Date   Hypertension    Syncope    No family history on file. No past surgical history on file. Patient Active Problem List   Diagnosis Date Noted   Pyelonephritis 03/20/2021       Prior to Admission medications   Medication Sig Start Date End Date Taking? Authorizing Provider  hydrochlorothiazide (HYDRODIURIL) 25 MG tablet Take 25 mg by mouth daily.    [provider]  ibuprofen (ADVIL,MOTRIN) 600 MG tablet Take 1 tablet (600 mg total) by mouth every 6 (six) hours as needed. 01/09/17   Enid Derry, PA-C    Allergies Patient has no known allergies.    Social History Social History   Tobacco Use   Smoking status: Every Day    Packs/day: 1.00    Types: Cigarettes   Smokeless tobacco: Never    Review of Systems Patient denies headaches, rhinorrhea, blurry vision, numbness, shortness of breath, chest pain, edema, cough, abdominal pain, nausea, vomiting, diarrhea, dysuria, fevers, rashes or hallucinations unless otherwise stated above in HPI. ____________________________________________   PHYSICAL EXAM:  VITAL  SIGNS: Vitals:   03/20/21 1439  BP: (!) 153/83  Pulse: 84  Resp: 20  Temp: (!) 101.4 F (38.6 C)  SpO2: 95%    Constitutional: Alert and oriented.  Eyes: Conjunctivae are normal.  Head: Atraumatic. Nose: No congestion/rhinnorhea. Mouth/Throat: Mucous membranes are moist.   Neck: No stridor. Painless ROM.  Cardiovascular: Normal rate, regular rhythm. Grossly normal heart sounds.  Good peripheral circulation. Respiratory: Normal respiratory effort.  No retractions. Lungs CTAB. Gastrointestinal: Soft and nontender. No distention. No abdominal bruits. No CVA tenderness. Genitourinary:  Musculoskeletal: No lower extremity tenderness nor edema.  No joint effusions. Neurologic:  Normal speech and language. No gross focal neurologic deficits are appreciated. No facial droop Skin:  Skin is warm, dry and intact. No rash noted. Psychiatric: Mood and affect are normal. Speech and behavior are normal.  ____________________________________________   LABS (all labs ordered are listed, but only abnormal results are displayed)  Results for orders placed or performed during the hospital encounter of 03/20/21 (from the past 24 hour(s))  Lactic acid, plasma     Status: Abnormal   Collection Time: 03/20/21  2:28 PM  Result Value Ref Range   Lactic Acid, Venous 2.0 (HH) 0.5 - 1.9 mmol/L  Comprehensive metabolic panel     Status: Abnormal   Collection Time: 03/20/21  2:28 PM  Result Value Ref Range   Sodium 137 135 - 145 mmol/L   Potassium 3.3 (L) 3.5 - 5.1 mmol/L   Chloride 98 98 - 111 mmol/L   CO2 27 22 -  32 mmol/L   Glucose, Bld 116 (H) 70 - 99 mg/dL   BUN 8 8 - 23 mg/dL   Creatinine, Ser 6.75 0.44 - 1.00 mg/dL   Calcium 8.8 (L) 8.9 - 10.3 mg/dL   Total Protein 7.8 6.5 - 8.1 g/dL   Albumin 3.7 3.5 - 5.0 g/dL   AST 24 15 - 41 U/L   ALT 23 0 - 44 U/L   Alkaline Phosphatase 83 38 - 126 U/L   Total Bilirubin 1.0 0.3 - 1.2 mg/dL   GFR, Estimated >91 >63 mL/min   Anion gap 12 5 - 15  CBC  with Differential     Status: Abnormal   Collection Time: 03/20/21  2:28 PM  Result Value Ref Range   WBC 15.1 (H) 4.0 - 10.5 K/uL   RBC 3.86 (L) 3.87 - 5.11 MIL/uL   Hemoglobin 13.6 12.0 - 15.0 g/dL   HCT 84.6 65.9 - 93.5 %   MCV 101.0 (H) 80.0 - 100.0 fL   MCH 35.2 (H) 26.0 - 34.0 pg   MCHC 34.9 30.0 - 36.0 g/dL   RDW 70.1 77.9 - 39.0 %   Platelets 197 150 - 400 K/uL   nRBC 0.0 0.0 - 0.2 %   Neutrophils Relative % 78 %   Neutro Abs 11.8 (H) 1.7 - 7.7 K/uL   Lymphocytes Relative 11 %   Lymphs Abs 1.6 0.7 - 4.0 K/uL   Monocytes Relative 10 %   Monocytes Absolute 1.5 (H) 0.1 - 1.0 K/uL   Eosinophils Relative 0 %   Eosinophils Absolute 0.0 0.0 - 0.5 K/uL   Basophils Relative 0 %   Basophils Absolute 0.0 0.0 - 0.1 K/uL   Immature Granulocytes 1 %   Abs Immature Granulocytes 0.10 (H) 0.00 - 0.07 K/uL  Urinalysis, Routine w reflex microscopic     Status: Abnormal   Collection Time: 03/20/21  2:28 PM  Result Value Ref Range   Color, Urine YELLOW (A) YELLOW   APPearance CLOUDY (A) CLEAR   Specific Gravity, Urine 1.017 1.005 - 1.030   pH 7.0 5.0 - 8.0   Glucose, UA NEGATIVE NEGATIVE mg/dL   Hgb urine dipstick MODERATE (A) NEGATIVE   Bilirubin Urine NEGATIVE NEGATIVE   Ketones, ur NEGATIVE NEGATIVE mg/dL   Protein, ur 300 (A) NEGATIVE mg/dL   Nitrite NEGATIVE NEGATIVE   Leukocytes,Ua LARGE (A) NEGATIVE   RBC / HPF >50 (H) 0 - 5 RBC/hpf   WBC, UA >50 (H) 0 - 5 WBC/hpf   Bacteria, UA MANY (A) NONE SEEN   Squamous Epithelial / LPF 0-5 0 - 5   WBC Clumps PRESENT    Mucus PRESENT   Lactic acid, plasma     Status: Abnormal   Collection Time: 03/20/21  4:07 PM  Result Value Ref Range   Lactic Acid, Venous 2.3 (HH) 0.5 - 1.9 mmol/L   ____________________________________________ ____________________________________________  RADIOLOGY  I personally reviewed all radiographic images ordered to evaluate for the above acute complaints and reviewed radiology reports and findings.   These findings were personally discussed with the patient.  Please see medical record for radiology report.  ____________________________________________   PROCEDURES  Procedure(s) performed:  Procedures    Critical Care performed: no ____________________________________________   INITIAL IMPRESSION / ASSESSMENT AND PLAN / ED COURSE  Pertinent labs & imaging results that were available during my care of the patient were reviewed by me and considered in my medical decision making (see chart for details).   DDX: sepisis,  pyelo, stone, cystitis, colitis, biliary pathology  Yvonne [name] is a 65 y.o. who presents to the ED with symptoms as described above.  Patient presenting with sepsis therefore blood cultures as well as IV fluids initiated will order IV Rocephin after urinalysis does appear consistent with infection suspect pyelonephritis based on her exam.  Clinical Course as of 03/20/21 1736  Mon Mar 20, 2021  1706 This patient does have mildly rising lactate and given her findings consistent with pyelonephritis given her age and risk factors will discuss with hospitalist for admission for IV antibiotics and additional IV hydration resuscitation.  Patient agreeable to plan. [PR]    Clinical Course User Index [PR] Willy Eddy, MD    The patient was evaluated in Emergency Department today for the symptoms described in the history of present illness. He/she was evaluated in the context of the global COVID-19 pandemic, which necessitated consideration that the patient might be at risk for infection with the SARS-CoV-2 virus that causes COVID-19. Institutional protocols and algorithms that pertain to the evaluation of patients at risk for COVID-19 are in a state of rapid change based on information released by regulatory bodies including the CDC and federal and state organizations. These policies and algorithms were followed during the patient's care in the ED.  As part of my  medical decision making, I reviewed the following data within the electronic MEDICAL RECORD NUMBER Nursing notes reviewed and incorporated, Labs reviewed, notes from prior ED visits and [place] Controlled Substance Database   ____________________________________________   FINAL CLINICAL IMPRESSION(S) / ED DIAGNOSES  Final diagnoses:  Pyelonephritis      NEW MEDICATIONS STARTED DURING THIS VISIT:  New Prescriptions   No medications on file     Note:  This document was prepared using Dragon voice recognition software and may include unintentional dictation errors.    Willy Eddy, MD 03/20/21 1736

## 2021-03-20 NOTE — ED Triage Notes (Signed)
Pt states that her symptoms started on Friday with dysuria, pt state she is now having ruq pain, sob, fever and feeling worse

## 2021-03-21 DIAGNOSIS — N12 Tubulo-interstitial nephritis, not specified as acute or chronic: Secondary | ICD-10-CM | POA: Diagnosis not present

## 2021-03-21 LAB — BASIC METABOLIC PANEL
Anion gap: 10 (ref 5–15)
BUN: <5 mg/dL — ABNORMAL LOW (ref 8–23)
CO2: 25 mmol/L (ref 22–32)
Calcium: 9.2 mg/dL (ref 8.9–10.3)
Chloride: 102 mmol/L (ref 98–111)
Creatinine, Ser: 0.49 mg/dL (ref 0.44–1.00)
GFR, Estimated: >60 mL/min (ref >60)
Glucose, Bld: 113 mg/dL — ABNORMAL HIGH (ref 70–99)
Potassium: 3.9 mmol/L (ref 3.5–5.1)
Sodium: 137 mmol/L (ref 135–145)

## 2021-03-21 LAB — CBC
HCT: 38.7 % (ref 36.0–46.0)
Hemoglobin: 12.9 g/dL (ref 12.0–15.0)
MCH: 33.6 pg (ref 26.0–34.0)
MCHC: 33.3 g/dL (ref 30.0–36.0)
MCV: 100.8 fL — ABNORMAL HIGH (ref 80.0–100.0)
Platelets: 189 10*3/uL (ref 150–400)
RBC: 3.84 MIL/uL — ABNORMAL LOW (ref 3.87–5.11)
RDW: 14.5 % (ref 11.5–15.5)
WBC: 12.4 10*3/uL — ABNORMAL HIGH (ref 4.0–10.5)
nRBC: 0 % (ref 0.0–0.2)

## 2021-03-21 LAB — HIV ANTIBODY (ROUTINE TESTING W REFLEX): HIV Screen 4th Generation wRfx: NONREACTIVE

## 2021-03-21 MED ORDER — OXYCODONE HCL 5 MG PO TABS: 5.0000 mg | ORAL_TABLET | Freq: Four times a day (QID) | ORAL | 0 refills | Status: DC | PRN

## 2021-03-21 MED ORDER — CIPROFLOXACIN HCL 500 MG PO TABS: 500.0000 mg | ORAL_TABLET | Freq: Two times a day (BID) | ORAL | 0 refills | Status: AC

## 2021-03-21 MED ORDER — ONDANSETRON HCL 4 MG PO TABS: 4.0000 mg | ORAL_TABLET | Freq: Every day | ORAL | 0 refills | Status: DC | PRN

## 2021-03-21 NOTE — Discharge Summary (Signed)
Physician Discharge Summary  Yvonne [name] TJQ:[id number] DOB: 05-06-1956 DOA: 03/20/2021  PCP: Ellene Route  Admit date: 03/20/2021 Discharge date: 03/21/2021  Admitted From: Home Disposition: Home  Recommendations for Outpatient Follow-up:  Follow up with PCP in 1-2 weeks   Home Health: No Equipment/Devices: None  Discharge Condition: Stable CODE STATUS: Full Diet recommendation: Regular  Brief/Interim Summary:  Yvonne [name] is a 65 y.o. female with medical history significant of hypertension who presents for evaluation of 3-day history of dysuria, right upper quadrant pain, shortness of breath, fever.  Symptoms of been progressive since that time.  No aggravating or alleviating factors at home.  On presentation patient was noted to be febrile T-max 101.4, hypertensive, otherwise hemodynamically stable.  Mentating clearly.  Laboratory investigation significant for urinalysis consistent with infection.  Follow-up renal stone study significant for asymmetric right-sided fat stranding.  Clinical presentation consistent with acute pyelonephritis.  Was monitored in hospital overnight on IV fluids and IV antibiotics.  Lactic acid cleared.  Leukocytosis improving.  Patient remained afebrile.  Symptoms improved in the following morning.  Urinating without dysuria.  Hemodynamically stable.  Blood cultures no growth to date at this time.  Urine culture unfortunately was not sent from the initial sample.  Urine add-on culture was ordered but patient stable for discharge home.  Will treat pyelonephritis empirically with ciprofloxacin.  Small amount of oxycodone and Zofran also prescribed at time of discharge.  Patient will be discharged home.  She is in stable condition.  Clear return to ED instructions been provided.    Discharge Diagnoses:  Active Problems:   Pyelonephritis  Acute right-sided pyelonephritis Severe sepsis secondary to above Sepsis criteria met with leukocytosis,  fever Severe sepsis criteria met with elevated lactic acid CT imaging consistent with right-sided pyelonephritis Imaging negative for stones Symptoms improved with IV fluids and IV antibiotics Plan: Patient stable for discharge home.  DC IV Rocephin.  Start ciprofloxacin 400 mg p.o. twice daily.  Complete course prescribed on discharge.  Also prescribe small amount of Zofran and oxycodone for symptomatic relief.  Patient educated to ensure adequate p.o. hydration.  Follow-up PCP 1 to 2 weeks   Essential hypertension Can resume home medications   Discharge Instructions  Discharge Instructions     Diet - low sodium heart healthy   Complete by: As directed    Increase activity slowly   Complete by: As directed       Allergies as of 03/21/2021   No Known Allergies      Medication List     TAKE these medications    amLODipine 10 MG tablet Commonly known as: NORVASC Take 10 mg by mouth daily.   aspirin EC 81 MG tablet Take 81 mg by mouth daily. Swallow whole.   atorvastatin 10 MG tablet Commonly known as: LIPITOR Take 10 mg by mouth daily.   cholecalciferol 25 MCG (1000 UNIT) tablet Commonly known as: VITAMIN D3 Take 1,000 Units by mouth daily.   ciprofloxacin 500 MG tablet Commonly known as: Cipro Take 1 tablet (500 mg total) by mouth 2 (two) times daily for 8 days.   metoprolol tartrate 25 MG tablet Commonly known as: LOPRESSOR Take 12.5 mg by mouth 2 (two) times daily.   ondansetron 4 MG tablet Commonly known as: Zofran Take 1 tablet (4 mg total) by mouth daily as needed for nausea or vomiting.   oxyCODONE 5 MG immediate release tablet Commonly known as: Oxy IR/ROXICODONE Take 1 tablet (5 mg total) by mouth every 6 (six)  hours as needed for moderate pain.        Follow-up Information     [facility], [facility]. Schedule an appointment as soon as possible for a visit in 1 week(s).   Contact information: [place]  [phone]                No Known Allergies  Consultations: None  Procedures/Studies: DG Chest 2 View  Result Date: 03/20/2021 CLINICAL DATA:  Shortness of breath EXAM: CHEST - 2 VIEW COMPARISON:  None. FINDINGS: The heart size and mediastinal contours are within normal limits. Both lungs are clear. Disc degenerative disease of the thoracic spine. IMPRESSION: No acute abnormality of the lungs. Electronically Signed   By: Delanna Ahmadi M.D.   On: 03/20/2021 15:08   CT Renal Stone Study  Result Date: 03/20/2021 CLINICAL DATA:  Flank pain EXAM: CT ABDOMEN AND PELVIS WITHOUT CONTRAST TECHNIQUE: Multidetector CT imaging of the abdomen and pelvis was performed following the standard protocol without IV contrast. COMPARISON:  None. FINDINGS: Lower chest: No acute abnormality. Hepatobiliary: No focal liver abnormality is seen. No gallstones, gallbladder wall thickening, or biliary dilatation. Pancreas: Unremarkable. No pancreatic ductal dilatation or surrounding inflammatory changes. Spleen: Normal in size without focal abnormality. Adrenals/Urinary Tract: Bilateral adrenal glands unremarkable. Punctate nonobstructing stone of the left kidney. Mild right greater than left hydronephrosis and hydroureter with asymmetric fat stranding about the right kidney. Ill-defined peripherally calcified low-attenuation areas of the interpolar right kidney. Mildly thick-walled urinary bladder. Stomach/Bowel: Stomach is within normal limits. Appendix appears normal. Diverticulosis. No evidence of bowel wall thickening, distention, or inflammatory changes. Vascular/Lymphatic: Aortic atherosclerosis. No enlarged abdominal or pelvic lymph nodes. Reproductive: Status post hysterectomy. No adnexal masses. Other: No abdominal wall hernia or abnormality. No abdominopelvic ascites. Musculoskeletal: Mild grade 1 anterolisthesis of L4 and L5, likely degenerative. No acute osseous abnormality. IMPRESSION: Asymmetric  fat stranding about the right kidney with mild right greater than left hydronephrosis and hydroureter. No obstructing stone visualized. Findings are concerning for pyelonephritis. Ill-defined peripherally calcified low-attenuation area of the interpolar right kidney, possibly a simple renal cyst or calyceal diverticulum, although superinfection cannot be excluded. Contrast-enhanced renal protocol CT or MRI could be performed for further evaluation. Mildly thick-walled urinary bladder, findings can be seen in the setting of cystitis. Aortic Atherosclerosis (ICD10-I70.0). Electronically Signed   By: Yetta Glassman M.D.   On: 03/20/2021 16:44      Subjective: Seen and examined on the day of discharge.  Stable no distress.  Pain-free.  Urinating without difficulty.  No fevers.  Stable for discharge home.  Discharge Exam: Vitals:   03/21/21 0332 03/21/21 0759  BP: 137/63 131/66  Pulse: 89 74  Resp: 16 18  Temp:  98.6 F (37 C)  SpO2: 97% 96%   Vitals:   03/21/21 0008 03/21/21 0100 03/21/21 0332 03/21/21 0759  BP: (!) 170/83 134/62 137/63 131/66  Pulse: 80  89 74  Resp: 18  16 18   Temp:    98.6 F (37 C)  TempSrc:    Oral  SpO2: 98%  97% 96%  Weight:      Height:        General: Pt is alert, awake, not in acute distress Cardiovascular: RRR, S1/S2 +, no rubs, no gallops Respiratory: CTA bilaterally, no wheezing, no rhonchi Abdominal: Soft, NT, ND, bowel sounds + Extremities: no edema, no cyanosis    The results of significant diagnostics from this hospitalization (including imaging, microbiology, ancillary and laboratory)  are listed below for reference.     Microbiology: Recent Results (from the past 240 hour(s))  Blood culture (routine x 2)     Status: None (Preliminary result)   Collection Time: 03/20/21  2:28 PM   Specimen: BLOOD  Result Value Ref Range Status   Specimen Description BLOOD RIGHT ANTECUBITAL  Final   Special Requests   Final    BOTTLES DRAWN AEROBIC AND  ANAEROBIC Blood Culture adequate volume   Culture   Final    NO GROWTH < 24 HOURS Performed at [facility], [address], [place], [place] [address]    Report Status PENDING  Incomplete  Resp Panel by RT-PCR (Flu A&B, Covid) Nasopharyngeal Swab     Status: None   Collection Time: 03/20/21  4:58 PM   Specimen: Nasopharyngeal Swab; Nasopharyngeal(NP) swabs in vial transport medium  Result Value Ref Range Status   SARS Coronavirus 2 by RT PCR NEGATIVE NEGATIVE Final    Comment: (NOTE) SARS-CoV-2 target nucleic acids are NOT DETECTED.  The SARS-CoV-2 RNA is generally detectable in upper respiratory specimens during the acute phase of infection. The lowest concentration of SARS-CoV-2 viral copies this assay can detect is 138 copies/mL. A negative result does not preclude SARS-Cov-2 infection and should not be used as the sole basis for treatment or other patient management decisions. A negative result may occur with  improper specimen collection/handling, submission of specimen other than nasopharyngeal swab, presence of viral mutation(s) within the areas targeted by this assay, and inadequate number of viral copies(<138 copies/mL). A negative result must be combined with clinical observations, patient history, and epidemiological information. The expected result is Negative.  Fact Sheet for Patients:  EntrepreneurPulse.com.au  Fact Sheet for Healthcare Providers:  IncredibleEmployment.be  This test is no t yet approved or cleared by the [place] FDA and  has been authorized for detection and/or diagnosis of SARS-CoV-2 by FDA under an Emergency Use Authorization (EUA). This EUA will remain  in effect (meaning this test can be used) for the duration of the COVID-19 declaration under Section 564(b)(1) of the Act, 21 U.S.C.section 360bbb-3(b)(1), unless the authorization is terminated  or revoked sooner.       Influenza A by PCR  NEGATIVE NEGATIVE Final   Influenza B by PCR NEGATIVE NEGATIVE Final    Comment: (NOTE) The Xpert Xpress SARS-CoV-2/FLU/RSV plus assay is intended as an aid in the diagnosis of influenza from Nasopharyngeal swab specimens and should not be used as a sole basis for treatment. Nasal washings and aspirates are unacceptable for Xpert Xpress SARS-CoV-2/FLU/RSV testing.  Fact Sheet for Patients: EntrepreneurPulse.com.au  Fact Sheet for Healthcare Providers: IncredibleEmployment.be  This test is not yet approved or cleared by the [place] FDA and has been authorized for detection and/or diagnosis of SARS-CoV-2 by FDA under an Emergency Use Authorization (EUA). This EUA will remain in effect (meaning this test can be used) for the duration of the COVID-19 declaration under Section 564(b)(1) of the Act, 21 U.S.C. section 360bbb-3(b)(1), unless the authorization is terminated or revoked.  Performed at [facility], [address] [place], [place] [address]   Blood culture (routine x 2)     Status: None (Preliminary result)   Collection Time: 03/20/21  5:08 PM   Specimen: BLOOD  Result Value Ref Range Status   Specimen Description BLOOD BLOOD RIGHT ARM  Final   Special Requests   Final    BOTTLES DRAWN AEROBIC AND ANAEROBIC Blood Culture adequate volume  Culture   Final    NO GROWTH < 24 HOURS Performed at [facility], [address] [place], [place] [address]    Report Status PENDING  Incomplete     Labs: BNP (last 3 results) No results for input(s): BNP in the last 8760 hours. Basic Metabolic Panel: Recent Labs  Lab 03/20/21 1428 03/21/21 0424  NA 137 137  K 3.3* 3.9  CL 98 102  CO2 27 25  GLUCOSE 116* 113*  BUN 8 <5*  CREATININE 0.63 0.49  CALCIUM 8.8* 9.2   Liver Function Tests: Recent Labs  Lab 03/20/21 1428  AST 24  ALT 23  ALKPHOS 83  BILITOT 1.0  PROT 7.8  ALBUMIN 3.7   No results for  input(s): LIPASE, AMYLASE in the last 168 hours. No results for input(s): AMMONIA in the last 168 hours. CBC: Recent Labs  Lab 03/20/21 1428 03/21/21 0424  WBC 15.1* 12.4*  NEUTROABS 11.8*  --   HGB 13.6 12.9  HCT 39.0 38.7  MCV 101.0* 100.8*  PLT 197 189   Cardiac Enzymes: No results for input(s): CKTOTAL, CKMB, CKMBINDEX, TROPONINI in the last 168 hours. BNP: Invalid input(s): POCBNP CBG: No results for input(s): GLUCAP in the last 168 hours. D-Dimer No results for input(s): DDIMER in the last 72 hours. Hgb A1c No results for input(s): HGBA1C in the last 72 hours. Lipid Profile No results for input(s): CHOL, HDL, LDLCALC, TRIG, CHOLHDL, LDLDIRECT in the last 72 hours. Thyroid function studies No results for input(s): TSH, T4TOTAL, T3FREE, THYROIDAB in the last 72 hours.  Invalid input(s): FREET3 Anemia work up No results for input(s): VITAMINB12, FOLATE, FERRITIN, TIBC, IRON, RETICCTPCT in the last 72 hours. Urinalysis    Component Value Date/Time   COLORURINE YELLOW (A) 03/20/2021 1428   APPEARANCEUR CLOUDY (A) 03/20/2021 1428   LABSPEC 1.017 03/20/2021 1428   PHURINE 7.0 03/20/2021 1428   GLUCOSEU NEGATIVE 03/20/2021 1428   HGBUR MODERATE (A) 03/20/2021 1428   BILIRUBINUR NEGATIVE 03/20/2021 [place] 03/20/2021 1428   PROTEINUR 100 (A) 03/20/2021 1428   NITRITE NEGATIVE 03/20/2021 1428   LEUKOCYTESUR LARGE (A) 03/20/2021 1428   Sepsis Labs Invalid input(s): PROCALCITONIN,  WBC,  LACTICIDVEN Microbiology Recent Results (from the past 240 hour(s))  Blood culture (routine x 2)     Status: None (Preliminary result)   Collection Time: 03/20/21  2:28 PM   Specimen: BLOOD  Result Value Ref Range Status   Specimen Description BLOOD RIGHT ANTECUBITAL  Final   Special Requests   Final    BOTTLES DRAWN AEROBIC AND ANAEROBIC Blood Culture adequate volume   Culture   Final    NO GROWTH < 24 HOURS Performed at [facility], [address], [place], [place] [address]    Report Status PENDING  Incomplete  Resp Panel by RT-PCR (Flu A&B, Covid) Nasopharyngeal Swab     Status: None   Collection Time: 03/20/21  4:58 PM   Specimen: Nasopharyngeal Swab; Nasopharyngeal(NP) swabs in vial transport medium  Result Value Ref Range Status   SARS Coronavirus 2 by RT PCR NEGATIVE NEGATIVE Final    Comment: (NOTE) SARS-CoV-2 target nucleic acids are NOT DETECTED.  The SARS-CoV-2 RNA is generally detectable in upper respiratory specimens during the acute phase of infection. The lowest concentration of SARS-CoV-2 viral copies this assay can detect is 138 copies/mL. A negative result does not preclude SARS-Cov-2 infection and should not be used as the sole basis for treatment or other  patient management decisions. A negative result may occur with  improper specimen collection/handling, submission of specimen other than nasopharyngeal swab, presence of viral mutation(s) within the areas targeted by this assay, and inadequate number of viral copies(<138 copies/mL). A negative result must be combined with clinical observations, patient history, and epidemiological information. The expected result is Negative.  Fact Sheet for Patients:  EntrepreneurPulse.com.au  Fact Sheet for Healthcare Providers:  IncredibleEmployment.be  This test is no t yet approved or cleared by the [place] FDA and  has been authorized for detection and/or diagnosis of SARS-CoV-2 by FDA under an Emergency Use Authorization (EUA). This EUA will remain  in effect (meaning this test can be used) for the duration of the COVID-19 declaration under Section 564(b)(1) of the Act, 21 U.S.C.section 360bbb-3(b)(1), unless the authorization is terminated  or revoked sooner.       Influenza A by PCR NEGATIVE NEGATIVE Final   Influenza B by PCR NEGATIVE NEGATIVE Final    Comment: (NOTE) The Xpert Xpress SARS-CoV-2/FLU/RSV plus assay  is intended as an aid in the diagnosis of influenza from Nasopharyngeal swab specimens and should not be used as a sole basis for treatment. Nasal washings and aspirates are unacceptable for Xpert Xpress SARS-CoV-2/FLU/RSV testing.  Fact Sheet for Patients: EntrepreneurPulse.com.au  Fact Sheet for Healthcare Providers: IncredibleEmployment.be  This test is not yet approved or cleared by the [place] FDA and has been authorized for detection and/or diagnosis of SARS-CoV-2 by FDA under an Emergency Use Authorization (EUA). This EUA will remain in effect (meaning this test can be used) for the duration of the COVID-19 declaration under Section 564(b)(1) of the Act, 21 U.S.C. section 360bbb-3(b)(1), unless the authorization is terminated or revoked.  Performed at [facility], [address] [place], [place] [address]   Blood culture (routine x 2)     Status: None (Preliminary result)   Collection Time: 03/20/21  5:08 PM   Specimen: BLOOD  Result Value Ref Range Status   Specimen Description BLOOD BLOOD RIGHT ARM  Final   Special Requests   Final    BOTTLES DRAWN AEROBIC AND ANAEROBIC Blood Culture adequate volume   Culture   Final    NO GROWTH < 24 HOURS Performed at [facility], [address] [place], [place] [address]    Report Status PENDING  Incomplete     Time coordinating discharge: Over 30 minutes  SIGNED:   Sidney Ace, MD  [facility] 03/21/2021, 2:20 PM Pager   If 7PM-7AM, please contact night-coverage

## 2021-03-23 LAB — URINE CULTURE: Culture: >=100000 — AB

## 2021-03-25 LAB — CULTURE, BLOOD (ROUTINE X 2)
Culture: NO GROWTH
Culture: NO GROWTH
Special Requests: ADEQUATE
Special Requests: ADEQUATE

## 2021-04-21 ENCOUNTER — Encounter (HOSPITAL_COMMUNITY): Payer: Self-pay | Admitting: Radiology

## 2021-09-01 ENCOUNTER — Encounter (INDEPENDENT_AMBULATORY_CARE_PROVIDER_SITE_OTHER): Payer: Self-pay

## 2021-09-01 ENCOUNTER — Encounter (INDEPENDENT_AMBULATORY_CARE_PROVIDER_SITE_OTHER): Payer: Self-pay | Admitting: Nurse Practitioner

## 2021-09-13 ENCOUNTER — Other Ambulatory Visit (INDEPENDENT_AMBULATORY_CARE_PROVIDER_SITE_OTHER): Payer: Self-pay | Admitting: Nurse Practitioner

## 2021-09-13 DIAGNOSIS — R6889 Other general symptoms and signs: Secondary | ICD-10-CM

## 2021-09-13 DIAGNOSIS — I739 Peripheral vascular disease, unspecified: Secondary | ICD-10-CM

## 2021-09-15 ENCOUNTER — Encounter (INDEPENDENT_AMBULATORY_CARE_PROVIDER_SITE_OTHER): Payer: Self-pay | Admitting: Nurse Practitioner

## 2021-09-15 ENCOUNTER — Ambulatory Visit (INDEPENDENT_AMBULATORY_CARE_PROVIDER_SITE_OTHER): Payer: Medicare Other

## 2021-09-15 DIAGNOSIS — I739 Peripheral vascular disease, unspecified: Secondary | ICD-10-CM | POA: Diagnosis not present

## 2021-09-15 DIAGNOSIS — R6889 Other general symptoms and signs: Secondary | ICD-10-CM

## 2021-09-29 ENCOUNTER — Encounter (INDEPENDENT_AMBULATORY_CARE_PROVIDER_SITE_OTHER): Payer: Self-pay | Admitting: Nurse Practitioner

## 2021-09-29 ENCOUNTER — Telehealth (INDEPENDENT_AMBULATORY_CARE_PROVIDER_SITE_OTHER): Payer: Self-pay

## 2021-09-29 ENCOUNTER — Ambulatory Visit (INDEPENDENT_AMBULATORY_CARE_PROVIDER_SITE_OTHER): Payer: Medicare Other | Admitting: Nurse Practitioner

## 2021-09-29 VITALS — BP 177/78 | HR 75 | Resp 17 | Ht 63.0 in | Wt 105.0 lb

## 2021-09-29 DIAGNOSIS — I739 Peripheral vascular disease, unspecified: Secondary | ICD-10-CM | POA: Diagnosis not present

## 2021-09-29 DIAGNOSIS — I10 Essential (primary) hypertension: Secondary | ICD-10-CM

## 2021-09-29 DIAGNOSIS — E782 Mixed hyperlipidemia: Secondary | ICD-10-CM

## 2021-09-29 DIAGNOSIS — F17200 Nicotine dependence, unspecified, uncomplicated: Secondary | ICD-10-CM | POA: Diagnosis not present

## 2021-09-29 NOTE — Telephone Encounter (Signed)
Spoke with the patient and she is scheduled with Dr. Wyn Quaker for a right and left leg angio on 10/02/21 (12:00 pm) and 10/09/21 (8:00 am) at [facility]. Pre-procedure instructions were discussed and were handed to the patient. ?

## 2021-10-02 ENCOUNTER — Other Ambulatory Visit: Payer: Self-pay

## 2021-10-02 ENCOUNTER — Encounter (INDEPENDENT_AMBULATORY_CARE_PROVIDER_SITE_OTHER): Payer: Self-pay | Admitting: Nurse Practitioner

## 2021-10-02 ENCOUNTER — Encounter: Payer: Self-pay | Admitting: Vascular Surgery

## 2021-10-02 ENCOUNTER — Encounter
Admission: RE | Disposition: A | Discharge status: Home / Self Care | Payer: Self-pay | Source: Home / Self Care | Attending: Vascular Surgery

## 2021-10-02 ENCOUNTER — Ambulatory Visit
Admission: RE | Admit: 2021-10-02 | Discharge: 2021-10-02 | Disposition: A | Discharge status: Home / Self Care | Payer: Medicare Other | Attending: Vascular Surgery | Admitting: Vascular Surgery

## 2021-10-02 DIAGNOSIS — L97519 Non-pressure chronic ulcer of other part of right foot with unspecified severity: Secondary | ICD-10-CM | POA: Insufficient documentation

## 2021-10-02 DIAGNOSIS — E782 Mixed hyperlipidemia: Secondary | ICD-10-CM | POA: Diagnosis not present

## 2021-10-02 DIAGNOSIS — I70235 Atherosclerosis of native arteries of right leg with ulceration of other part of foot: Secondary | ICD-10-CM | POA: Insufficient documentation

## 2021-10-02 DIAGNOSIS — I701 Atherosclerosis of renal artery: Secondary | ICD-10-CM

## 2021-10-02 DIAGNOSIS — F17210 Nicotine dependence, cigarettes, uncomplicated: Secondary | ICD-10-CM | POA: Diagnosis not present

## 2021-10-02 DIAGNOSIS — I10 Essential (primary) hypertension: Secondary | ICD-10-CM | POA: Insufficient documentation

## 2021-10-02 DIAGNOSIS — I739 Peripheral vascular disease, unspecified: Secondary | ICD-10-CM

## 2021-10-02 HISTORY — DX: Peripheral vascular disease, unspecified: I73.9

## 2021-10-02 HISTORY — PX: LOWER EXTREMITY ANGIOGRAPHY: CATH118251

## 2021-10-02 HISTORY — DX: Hyperlipidemia, unspecified: E78.5

## 2021-10-02 LAB — CREATININE, SERUM
Creatinine, Ser: 0.5 mg/dL (ref 0.44–1.00)
GFR, Estimated: >60 mL/min (ref >60)

## 2021-10-02 LAB — BUN: BUN: 13 mg/dL (ref 8–23)

## 2021-10-02 SURGERY — LOWER EXTREMITY ANGIOGRAPHY
Anesthesia: Moderate Sedation | Site: Leg Lower | Laterality: Right

## 2021-10-02 MED ORDER — ONDANSETRON HCL 4 MG/2ML IJ SOLN: 4.0000 mg | Freq: Four times a day (QID) | INTRAMUSCULAR | Status: DC | PRN

## 2021-10-02 MED ORDER — SODIUM CHLORIDE 0.9 % IV SOLN: INTRAVENOUS | Status: DC

## 2021-10-02 MED ORDER — CLOPIDOGREL BISULFATE 75 MG PO TABS: 75.0000 mg | ORAL_TABLET | Freq: Every day | ORAL | Status: DC

## 2021-10-02 MED ORDER — HEPARIN SODIUM (PORCINE) 1000 UNIT/ML IJ SOLN
INTRAMUSCULAR | Status: DC | PRN
  Administered 2021-10-02: 4000 [IU] via INTRAVENOUS

## 2021-10-02 MED ORDER — CEFAZOLIN SODIUM-DEXTROSE 2-4 GM/100ML-% IV SOLN
INTRAVENOUS | Status: AC
  Administered 2021-10-02: 2 g via INTRAVENOUS
  Filled 2021-10-02: qty 100

## 2021-10-02 MED ORDER — METHYLPREDNISOLONE SODIUM SUCC 125 MG IJ SOLR: 125.0000 mg | Freq: Once | INTRAMUSCULAR | Status: DC | PRN

## 2021-10-02 MED ORDER — HYDROMORPHONE HCL 1 MG/ML IJ SOLN: 1.0000 mg | Freq: Once | INTRAMUSCULAR | Status: DC | PRN

## 2021-10-02 MED ORDER — SODIUM CHLORIDE 0.9 % IV SOLN
INTRAVENOUS | Status: DC
  Administered 2021-10-02: 1000 mL via INTRAVENOUS

## 2021-10-02 MED ORDER — FENTANYL CITRATE (PF) 100 MCG/2ML IJ SOLN
INTRAMUSCULAR | Status: DC | PRN
  Administered 2021-10-02: 50 ug via INTRAVENOUS
  Administered 2021-10-02: 25 ug via INTRAVENOUS

## 2021-10-02 MED ORDER — FENTANYL CITRATE PF 50 MCG/ML IJ SOSY
PREFILLED_SYRINGE | INTRAMUSCULAR | Status: AC
  Filled 2021-10-02: qty 1

## 2021-10-02 MED ORDER — MIDAZOLAM HCL 2 MG/ML PO SYRP
8.0000 mg | ORAL_SOLUTION | Freq: Once | ORAL | Status: DC | PRN
Start: 2021-10-02 — End: 2021-10-02

## 2021-10-02 MED ORDER — MIDAZOLAM HCL 2 MG/2ML IJ SOLN
INTRAMUSCULAR | Status: DC | PRN
  Administered 2021-10-02 (×2): 1 mg via INTRAVENOUS

## 2021-10-02 MED ORDER — SODIUM CHLORIDE 0.9% FLUSH: 3.0000 mL | Freq: Two times a day (BID) | INTRAVENOUS | Status: DC

## 2021-10-02 MED ORDER — LABETALOL HCL 5 MG/ML IV SOLN: 10.0000 mg | INTRAVENOUS | Status: DC | PRN

## 2021-10-02 MED ORDER — SODIUM CHLORIDE 0.9 % IV SOLN: 250.0000 mL | INTRAVENOUS | Status: DC | PRN

## 2021-10-02 MED ORDER — CLOPIDOGREL BISULFATE 75 MG PO TABS: 75.0000 mg | ORAL_TABLET | Freq: Every day | ORAL | 11 refills | Status: DC

## 2021-10-02 MED ORDER — CEFAZOLIN SODIUM-DEXTROSE 2-4 GM/100ML-% IV SOLN: 2.0000 g | INTRAVENOUS | Status: AC

## 2021-10-02 MED ORDER — SODIUM CHLORIDE 0.9% FLUSH: 3.0000 mL | INTRAVENOUS | Status: DC | PRN

## 2021-10-02 MED ORDER — FAMOTIDINE 20 MG PO TABS
40.0000 mg | ORAL_TABLET | Freq: Once | ORAL | Status: DC | PRN
Start: 2021-10-02 — End: 2021-10-02

## 2021-10-02 MED ORDER — ACETAMINOPHEN 325 MG PO TABS: 650.0000 mg | ORAL_TABLET | ORAL | Status: DC | PRN

## 2021-10-02 MED ORDER — MIDAZOLAM HCL 5 MG/5ML IJ SOLN
INTRAMUSCULAR | Status: AC
  Filled 2021-10-02: qty 5

## 2021-10-02 MED ORDER — DIPHENHYDRAMINE HCL 50 MG/ML IJ SOLN: 50.0000 mg | Freq: Once | INTRAMUSCULAR | Status: DC | PRN

## 2021-10-02 MED ORDER — HYDRALAZINE HCL 20 MG/ML IJ SOLN: 5.0000 mg | INTRAMUSCULAR | Status: DC | PRN

## 2021-10-02 MED ORDER — IODIXANOL 320 MG/ML IV SOLN
INTRAVENOUS | Status: DC | PRN
  Administered 2021-10-02: 60 mL via INTRA_ARTERIAL

## 2021-10-02 SURGICAL SUPPLY — 22 items
BALLN LUTONIX 018 4X150X130 (BALLOONS) ×2
BALLN LUTONIX 018 4X300X130 (BALLOONS) ×4
BALLN ULTRVRSE 2.5X300X150 (BALLOONS) ×2
BALLOON LUTONIX 018 4X150X130 (BALLOONS) IMPLANT
BALLOON LUTONIX 018 4X300X130 (BALLOONS) IMPLANT
BALLOON ULTRVRSE 2.5X300X150 (BALLOONS) IMPLANT
CATH ANGIO 5F PIGTAIL 65CM (CATHETERS) ×1 IMPLANT
CATH BEACON 5 .038 100 VERT TP (CATHETERS) ×1 IMPLANT
CATH TEMPO 5F RIM 65CM (CATHETERS) ×1 IMPLANT
COVER PROBE U/S 5X48 (MISCELLANEOUS) ×1 IMPLANT
DEVICE STARCLOSE SE CLOSURE (Vascular Products) ×1 IMPLANT
GLIDEWIRE ADV .035X260CM (WIRE) ×1 IMPLANT
KIT ENCORE 26 ADVANTAGE (KITS) ×1 IMPLANT
PACK ANGIOGRAPHY (CUSTOM PROCEDURE TRAY) ×2 IMPLANT
SHEATH ANL2 6FRX45 HC (SHEATH) ×1 IMPLANT
SHEATH BRITE TIP 5FRX11 (SHEATH) ×1 IMPLANT
STENT VIABAHN 5X250X120 (Permanent Stent) ×1 IMPLANT
STENT VIABAHN 5X7.5X120 (Permanent Stent) ×1 IMPLANT
SYR MEDRAD MARK 7 150ML (SYRINGE) ×1 IMPLANT
TUBING CONTRAST HIGH PRESS 72 (TUBING) ×1 IMPLANT
WIRE G V18X300CM (WIRE) ×1 IMPLANT
WIRE GUIDERIGHT .035X150 (WIRE) ×1 IMPLANT

## 2021-10-02 NOTE — Interval H&P Note (Signed)
History and Physical Interval Note: ? ?10/02/2021 ?11:54 AM ? ?Yvonne [name]  has presented today for surgery, with the diagnosis of RLE Angio   BARD    PVD.  The various methods of treatment have been discussed with the patient and family. After consideration of risks, benefits and other options for treatment, the patient has consented to  Procedure(s): ?Lower Extremity Angiography (Right) as a surgical intervention.  The patient's history has been reviewed, patient examined, no change in status, stable for surgery.  I have reviewed the patient's chart and labs.  Questions were answered to the patient's satisfaction.   ? ? ?[name] ? ? ?

## 2021-10-02 NOTE — Op Note (Signed)
[facility] ? Percutaneous Study/Intervention Procedural Note ? ? ?Date of Surgery: 10/02/2021 ? ?Surgeon(s):Kaeden Mester   ? ?Assistants:none ? ?Pre-operative Diagnosis: PAD with ulceration right lower extremity ? ?Post-operative diagnosis:  Same ? ?Procedure(s) Performed: ?            1.  Ultrasound guidance for vascular access left femoral artery ?            2.  Catheter placement into right common femoral artery from left femoral approach ?            3.  Aortogram and selective right lower extremity angiogram ?            4.  Percutaneous transluminal angioplasty of right peroneal artery and tibioperoneal trunk with 2.5 mm diameter by 30 cm length angioplasty balloon ?            5.  Percutaneous transluminal angioplasty of the right SFA and popliteal arteries with a pair of 4 mm diameter Lutonix drug-coated angioplasty balloons ? 6.  Viabahn stent placement x2 to the right SFA and most proximal popliteal artery with a 5 mm diameter by 25 cm length and 5 mm diameter by 7.5 cm length stent ?            7.  StarClose closure device left femoral artery ? ?EBL: 10 cc ? ?Contrast: 60 cc ? ?Fluoro Time: 8.6 minutes ? ?Moderate Conscious Sedation Time: approximately 51 minutes using 2 mg of Versed and 75 mcg of Fentanyl ?             ?Indications:  Patient is a 66 y.o.female with early ulcerations of the right foot with clear rest pain. The patient has noninvasive study showing markedly reduced perfusion bilaterally particularly on the right. The patient is brought in for angiography for further evaluation and potential treatment.  Due to the limb threatening nature of the situation, angiogram was performed for attempted limb salvage. The patient is aware that if the procedure fails, amputation would be expected.  The patient also understands that even with successful revascularization, amputation may still be required due to the severity of the situation.  Risks and benefits are discussed and  informed consent is obtained.  ? ?Procedure:  The patient was identified and appropriate procedural time out was performed.  The patient was then placed supine on the table and prepped and draped in the usual sterile fashion. Moderate conscious sedation was administered during a face to face encounter with the patient throughout the procedure with my supervision of the RN administering medicines and monitoring the patient's vital signs, pulse oximetry, telemetry and mental status throughout from the start of the procedure until the patient was taken to the recovery room. Ultrasound was used to evaluate the left common femoral artery.  It was patent .  A digital ultrasound image was acquired.  A Seldinger needle was used to access the left common femoral artery under direct ultrasound guidance and a permanent image was performed.  A 0.035 J wire was advanced without resistance and a 5Fr sheath was placed.  Pigtail catheter was placed into the aorta and an AP aortogram was performed. This demonstrated that the right renal artery appeared to have at least moderate stenosis, left renal artery to be patent.  The aorta was patent.  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  The left appeared to have 50 to  60% stenosis but she was scheduled to come back and have her left leg treated next week so it can be addressed at that time.  Both external iliac arteries appear to be patent. I then crossed the aortic bifurcation and advanced to the right femoral head. Selective right lower extremity angiogram was then performed. This demonstrated fairly normal common femoral artery and profunda femoris artery, but the SFA and popliteal arteries were diffusely diseased with multiple areas of significant stenosis throughout and a moderate length occlusion in the mid and distal SFA down to the proximal popliteal artery.  The below-knee popliteal artery was  fairly normal.  There was severe tibial disease with the peroneal artery being the only vessel significant runoff distally and it was diseased with multiple areas of greater than 75% stenosis.  The anterior tibial artery occluded in the midsegment did not reconstitute distally.  The posterior tibial artery occluded proximally and faintly reconstituted distally through peroneal collaterals. It was felt that it was in the patient's best interest to proceed with intervention after these images to avoid a second procedure and a larger amount of contrast and fluoroscopy based off of the findings from the initial angiogram. The patient was systemically heparinized and a 6 [place] Ansell sheath was then placed over the [employer] wire. I then used a Kumpe catheter and the advantage wire to cross the long segment SFA and popliteal lesions and confirm intraluminal flow in the mid popliteal artery.  I then exchanged for a V18 wire and cross the stenosis in the peroneal artery and tibioperoneal trunk.  I then proceeded with treatment.  A 2.5 mm diameter by 30 cm length angioplasty balloon was inflated from the mid to distal peroneal artery up to the tibioperoneal trunk and taken up to 12 atm for 1 minute.  A 4 mm diameter by 30 cm length and a 4 mm diameter by 15 cm length Lutonix drug-coated angioplasty balloon were inflated from the below-knee popliteal artery up to the common femoral artery encompassing the entire diseased segment of the SFA and above-knee popliteal artery.  These inflations were 6 to 8 atm for 1 minute.  Completion imaging showed the SFA to be fairly diffusely diseased with multiple areas of greater than 50% residual stenosis after angioplasty, but after an initial stenosis in the proximal popliteal artery the remainder of the popliteal artery appear to be patent after angioplasty and not requiring stent placement.  The peroneal artery and tibioperoneal trunk were also improved with less than 30%  residual stenosis.  I then used a 5 mm diameter by 25 cm length and a 5 mm diameter by 7.5 cm length Viabahn stent from the origin of the SFA down to Hunter's canal and postdilated this with 4 mm balloon with excellent angiographic completion result and less than 10% residual stenosis. I elected to terminate the procedure. The sheath was removed and StarClose closure device was deployed in the left femoral artery with excellent hemostatic result. The patient was taken to the recovery room in stable condition having tolerated the procedure well. ? ?Findings:   ?            Aortogram: Right renal artery appeared to have at least moderate stenosis, left renal artery to be patent.  The aorta was patent.  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  The left appeared to have 50 to 60% stenosis but she was  scheduled to come back and have her left leg treated next week so it can be addressed at that time.  Both external iliac arteries appear to be patent. ?            Right lower Extremity:  This demonstrated fairly normal common femoral artery and profunda femoris artery, but the SFA and popliteal arteries were diffusely diseased with multiple areas of significant stenosis throughout and a moderate length occlusion in the mid and distal SFA down to the proximal popliteal artery.  The below-knee popliteal artery was fairly normal.  There was severe tibial disease with the peroneal artery being the only vessel significant runoff distally and it was diseased with multiple areas of greater than 75% stenosis.  The anterior tibial artery occluded in the midsegment did not reconstitute distally.  The posterior tibial artery occluded proximally and faintly reconstituted distally through peroneal collaterals. ? ? ?Disposition: Patient was taken to the recovery room in stable condition having tolerated the procedure well. ? ?Complications:  None ? ?[name] ?10/02/2021 ?2:36 PM ? ? ?This note was created with Dragon Medical transcription system. Any errors in dictation are purely unintentional.  ?

## 2021-10-02 NOTE — H&P (View-Only) (Signed)
? ?Subjective:  ? ? Patient ID: Yvonne [name], female    DOB: 23-Feb-1956, 66 y.o.   MRN: [id number] ?Chief Complaint  ?Patient presents with  ? Establish Care  ?  Referred by Dr Gwen Pounds  ? ? ?[name] is a 66 year old female that presents today as a referral from her cardiologist, Dr. Liborio Nixon in regards to worsening claudication-like symptoms.  The patient notes that she has had claudication-like symptoms ongoing for the last year or so.  Recently over the last 6 months she has began develop rest pain like symptoms and she has not been able to get an adequate night sleep in that timeframe.  She notes that she has to get up and walk for the aching and throbbing in her legs during an active stop.  She also endorses having her leg off the side of the bed as well.  Recently the patient has had numbness and tingling of the lower extremities.  On evaluation of the lower extremities there are some darkened areas with areas concerning for beginning necrotic changes on her fifth toe as well as an area on her first toe of the right lower extremity. ? ?Today the patient has a right ABI of 0.52 with a left of 0.61 with monophasic tibial artery waveforms bilaterally.  Additional lower extremity arterial duplex show a more substantial narrowing in the right mid SFA with a 50 to 74% stenosis.  There is also contrast and velocities in the left mid SFA transition from triphasic to monophasic waveforms. ? ? ?Review of Systems  ?Skin:  Positive for wound.  ?All other systems reviewed and are negative. ? ?   ?Objective:  ? Physical Exam ?Vitals reviewed.  ?HENT:  ?   Head: Normocephalic.  ?Cardiovascular:  ?   Rate and Rhythm: Normal rate.  ?Pulmonary:  ?   Effort: Pulmonary effort is normal.  ?Skin: ?   General: Skin is warm and dry.  ?Neurological:  ?   Mental Status: She is alert and oriented to person, place, and time.  ?Psychiatric:     ?   Mood and Affect: Mood normal.     ?   Behavior: Behavior normal.     ?   Thought Content:  Thought content normal.     ?   Judgment: Judgment normal.  ? ? ?BP (!) 177/78 (BP Location: Right Arm)   Pulse 75   Resp 17   Ht 5\' 3"  (1.6 m)   Wt 105 lb (47.6 kg)   BMI 18.60 kg/m?  ? ?Past Medical History:  ?Diagnosis Date  ? Hypertension   ? Syncope   ? ? ?Social History  ? ?Socioeconomic History  ? Marital status: Divorced  ?  Spouse name: Not on file  ? Number of children: Not on file  ? Years of education: Not on file  ? Highest education level: Not on file  ?Occupational History  ? Not on file  ?Tobacco Use  ? Smoking status: Every Day  ?  Packs/day: 1.00  ?  Types: Cigarettes  ? Smokeless tobacco: Never  ?Substance and Sexual Activity  ? Alcohol use: Not on file  ? Drug use: Not on file  ? Sexual activity: Not on file  ?Other Topics Concern  ? Not on file  ?Social History Narrative  ? Not on file  ? ?Social Determinants of Health  ? ?Financial Resource Strain: Not on file  ?Food Insecurity: Not on file  ?Transportation Needs: Not on file  ?Physical Activity: Not  on file  ?Stress: Not on file  ?Social Connections: Not on file  ?Intimate Partner Violence: Not on file  ? ? ?History reviewed. No pertinent surgical history. ? ?History reviewed. No pertinent family history. ? ?No Known Allergies ? ? ?  Latest Ref Rng & Units 03/21/2021  ?  4:24 AM 03/20/2021  ?  2:28 PM 12/02/2019  ? 10:55 AM  ?CBC  ?WBC 4.0 - 10.5 K/uL 12.4   15.1   6.1    ?Hemoglobin 12.0 - 15.0 g/dL 12.9   13.6   13.1    ?Hematocrit 36.0 - 46.0 % 38.7   39.0   38.1    ?Platelets 150 - 400 K/uL 189   197   266    ? ? ? ? ?CMP  ?   ?Component Value Date/Time  ? NA 137 03/21/2021 0424  ? K 3.9 03/21/2021 0424  ? CL 102 03/21/2021 0424  ? CO2 25 03/21/2021 0424  ? GLUCOSE 113 (H) 03/21/2021 0424  ? BUN <5 (L) 03/21/2021 0424  ? CREATININE 0.49 03/21/2021 0424  ? CALCIUM 9.2 03/21/2021 0424  ? PROT 7.8 03/20/2021 1428  ? ALBUMIN 3.7 03/20/2021 1428  ? AST 24 03/20/2021 1428  ? ALT 23 03/20/2021 1428  ? ALKPHOS 83 03/20/2021 1428  ? BILITOT 1.0  03/20/2021 1428  ? GFRNONAA >60 03/21/2021 0424  ? GFRAA >60 12/02/2019 1055  ? ? ? ?VAS US ABI WITH/WO TBI ? ?Result Date: 09/18/2021 ? LOWER EXTREMITY DOPPLER STUDY Patient Name:  Yvonne [name]  Date of Exam:   09/15/2021 Medical Rec #: [id number]    Accession #:    [id number] Date of Birth: 09/26/1955   Patient Gender: F Patient Age:   65 years Exam Location:  [facility] Procedure:      VAS US ABI WITH/WO TBI Referring Phys: Jason Dew --------------------------------------------------------------------------------  Indications: Claudication, and rest pain.  Performing Technologist: Solomon Mcclary RVS  Examination Guidelines: A complete evaluation includes at minimum, Doppler waveform signals and systolic blood pressure reading at the level of bilateral brachial, anterior tibial, and posterior tibial arteries, when vessel segments are accessible. Bilateral testing is considered an integral part of a complete examination. Photoelectric Plethysmograph (PPG) waveforms and toe systolic pressure readings are included as required and additional duplex testing as needed. Limited examinations for reoccurring indications may be performed as noted.  ABI Findings: +---------+------------------+-----+----------+--------+ Right    Rt Pressure (mmHg)IndexWaveform  Comment  +---------+------------------+-----+----------+--------+ Brachial 146                                       +---------+------------------+-----+----------+--------+ ATA      63                0.42 monophasic         +---------+------------------+-----+----------+--------+ PTA      79                0.52 monophasic         +---------+------------------+-----+----------+--------+ Great Toe36                0.24 Abnormal           +---------+------------------+-----+----------+--------+ +---------+------------------+-----+----------+-------+ Left     Lt Pressure (mmHg)IndexWaveform  Comment  +---------+------------------+-----+----------+-------+ Brachial 151                                      +---------+------------------+-----+----------+-------+   ATA      92                0.61 monophasic        +---------+------------------+-----+----------+-------+ PTA      88                0.58 monophasic        +---------+------------------+-----+----------+-------+ Great Toe95                0.63 Abnormal          +---------+------------------+-----+----------+-------+  Summary: Right: Resting right ankle-brachial index indicates moderate right lower extremity arterial disease. The right toe-brachial index is abnormal. Left: Resting left ankle-brachial index indicates moderate left lower extremity arterial disease. The left toe-brachial index is normal.  *See table(s) above for measurements and observations.  Electronically signed by [name] MD on 09/18/2021 at 3:20:26 PM.    Final   ? ? ?   ?Assessment & Plan:  ? ?1. Peripheral arterial disease (HCC) ? Recommend: ? ?The patient has evidence of severe atherosclerotic changes of both lower extremities associated with ulceration and tissue loss of the right foot.  This represents a limb threatening ischemia and places the patient at the risk for right lower extremity limb loss. ? ?Patient should undergo angiography of the right lower extremity, followed by the left lower extremity with the hope for intervention for limb salvage.  The risks and benefits as well as the alternative therapies was discussed in detail with the patient.  All questions were answered.  Patient agrees to proceed with bilateral lower extremity angiography. ? ?The patient will follow up with me in the office after the procedure.   ? ?2. Benign essential HTN ?Continue antihypertensive medications as already ordered, these medications have been reviewed and there are no changes at this time.  ? ?3. Hyperlipidemia, mixed ?Continue statin as ordered and reviewed, no  changes at this time  ? ?4. Smoker ?Smoking cessation was discussed, 3-10 minutes spent on this topic specifically  ? ? ?Current Outpatient Medications on File Prior to Visit  ?Medication Sig Dispense Refill  ? amLODipine (NORVASC) 10 MG tablet Take 10 mg by mouth daily.    ? aspirin EC 81 MG t

## 2021-10-02 NOTE — H&P (View-Only) (Signed)
? ?Subjective:  ? ? Patient ID: Yvonne [name], female    DOB: 23-Feb-1956, 66 y.o.   MRN: [id number] ?Chief Complaint  ?Patient presents with  ? Establish Care  ?  Referred by Dr Gwen Pounds  ? ? ?[name] is a 66 year old female that presents today as a referral from her cardiologist, Dr. Liborio Nixon in regards to worsening claudication-like symptoms.  The patient notes that she has had claudication-like symptoms ongoing for the last year or so.  Recently over the last 6 months she has began develop rest pain like symptoms and she has not been able to get an adequate night sleep in that timeframe.  She notes that she has to get up and walk for the aching and throbbing in her legs during an active stop.  She also endorses having her leg off the side of the bed as well.  Recently the patient has had numbness and tingling of the lower extremities.  On evaluation of the lower extremities there are some darkened areas with areas concerning for beginning necrotic changes on her fifth toe as well as an area on her first toe of the right lower extremity. ? ?Today the patient has a right ABI of 0.52 with a left of 0.61 with monophasic tibial artery waveforms bilaterally.  Additional lower extremity arterial duplex show a more substantial narrowing in the right mid SFA with a 50 to 74% stenosis.  There is also contrast and velocities in the left mid SFA transition from triphasic to monophasic waveforms. ? ? ?Review of Systems  ?Skin:  Positive for wound.  ?All other systems reviewed and are negative. ? ?   ?Objective:  ? Physical Exam ?Vitals reviewed.  ?HENT:  ?   Head: Normocephalic.  ?Cardiovascular:  ?   Rate and Rhythm: Normal rate.  ?Pulmonary:  ?   Effort: Pulmonary effort is normal.  ?Skin: ?   General: Skin is warm and dry.  ?Neurological:  ?   Mental Status: She is alert and oriented to person, place, and time.  ?Psychiatric:     ?   Mood and Affect: Mood normal.     ?   Behavior: Behavior normal.     ?   Thought Content:  Thought content normal.     ?   Judgment: Judgment normal.  ? ? ?BP (!) 177/78 (BP Location: Right Arm)   Pulse 75   Resp 17   Ht 5\' 3"  (1.6 m)   Wt 105 lb (47.6 kg)   BMI 18.60 kg/m?  ? ?Past Medical History:  ?Diagnosis Date  ? Hypertension   ? Syncope   ? ? ?Social History  ? ?Socioeconomic History  ? Marital status: Divorced  ?  Spouse name: Not on file  ? Number of children: Not on file  ? Years of education: Not on file  ? Highest education level: Not on file  ?Occupational History  ? Not on file  ?Tobacco Use  ? Smoking status: Every Day  ?  Packs/day: 1.00  ?  Types: Cigarettes  ? Smokeless tobacco: Never  ?Substance and Sexual Activity  ? Alcohol use: Not on file  ? Drug use: Not on file  ? Sexual activity: Not on file  ?Other Topics Concern  ? Not on file  ?Social History Narrative  ? Not on file  ? ?Social Determinants of Health  ? ?Financial Resource Strain: Not on file  ?Food Insecurity: Not on file  ?Transportation Needs: Not on file  ?Physical Activity: Not  on file  ?Stress: Not on file  ?Social Connections: Not on file  ?Intimate Partner Violence: Not on file  ? ? ?History reviewed. No pertinent surgical history. ? ?History reviewed. No pertinent family history. ? ?No Known Allergies ? ? ?  Latest Ref Rng & Units 03/21/2021  ?  4:24 AM 03/20/2021  ?  2:28 PM 12/02/2019  ? 10:55 AM  ?CBC  ?WBC 4.0 - 10.5 K/uL 12.4   15.1   6.1    ?Hemoglobin 12.0 - 15.0 g/dL 12.9   13.6   13.1    ?Hematocrit 36.0 - 46.0 % 38.7   39.0   38.1    ?Platelets 150 - 400 K/uL 189   197   266    ? ? ? ? ?CMP  ?   ?Component Value Date/Time  ? NA 137 03/21/2021 0424  ? K 3.9 03/21/2021 0424  ? CL 102 03/21/2021 0424  ? CO2 25 03/21/2021 0424  ? GLUCOSE 113 (H) 03/21/2021 0424  ? BUN <5 (L) 03/21/2021 0424  ? CREATININE 0.49 03/21/2021 0424  ? CALCIUM 9.2 03/21/2021 0424  ? PROT 7.8 03/20/2021 1428  ? ALBUMIN 3.7 03/20/2021 1428  ? AST 24 03/20/2021 1428  ? ALT 23 03/20/2021 1428  ? ALKPHOS 83 03/20/2021 1428  ? BILITOT 1.0  03/20/2021 1428  ? GFRNONAA >60 03/21/2021 0424  ? GFRAA >60 12/02/2019 1055  ? ? ? ?VAS US ABI WITH/WO TBI ? ?Result Date: 09/18/2021 ? LOWER EXTREMITY DOPPLER STUDY Patient Name:  Yvonne [name]  Date of Exam:   09/15/2021 Medical Rec #: [id number]    Accession #:    [id number] Date of Birth: 12/25/1955   Patient Gender: F Patient Age:   65 years Exam Location:  [facility] Procedure:      VAS US ABI WITH/WO TBI Referring Phys: Jason Dew --------------------------------------------------------------------------------  Indications: Claudication, and rest pain.  Performing Technologist: Solomon Mcclary RVS  Examination Guidelines: A complete evaluation includes at minimum, Doppler waveform signals and systolic blood pressure reading at the level of bilateral brachial, anterior tibial, and posterior tibial arteries, when vessel segments are accessible. Bilateral testing is considered an integral part of a complete examination. Photoelectric Plethysmograph (PPG) waveforms and toe systolic pressure readings are included as required and additional duplex testing as needed. Limited examinations for reoccurring indications may be performed as noted.  ABI Findings: +---------+------------------+-----+----------+--------+ Right    Rt Pressure (mmHg)IndexWaveform  Comment  +---------+------------------+-----+----------+--------+ Brachial 146                                       +---------+------------------+-----+----------+--------+ ATA      63                0.42 monophasic         +---------+------------------+-----+----------+--------+ PTA      79                0.52 monophasic         +---------+------------------+-----+----------+--------+ Great Toe36                0.24 Abnormal           +---------+------------------+-----+----------+--------+ +---------+------------------+-----+----------+-------+ Left     Lt Pressure (mmHg)IndexWaveform  Comment  +---------+------------------+-----+----------+-------+ Brachial 151                                      +---------+------------------+-----+----------+-------+   ATA      92                0.61 monophasic        +---------+------------------+-----+----------+-------+ PTA      88                0.58 monophasic        +---------+------------------+-----+----------+-------+ Great Toe95                0.63 Abnormal          +---------+------------------+-----+----------+-------+  Summary: Right: Resting right ankle-brachial index indicates moderate right lower extremity arterial disease. The right toe-brachial index is abnormal. Left: Resting left ankle-brachial index indicates moderate left lower extremity arterial disease. The left toe-brachial index is normal.  *See table(s) above for measurements and observations.  Electronically signed by [name] MD on 09/18/2021 at 3:20:26 PM.    Final   ? ? ?   ?Assessment & Plan:  ? ?1. Peripheral arterial disease (HCC) ? Recommend: ? ?The patient has evidence of severe atherosclerotic changes of both lower extremities associated with ulceration and tissue loss of the right foot.  This represents a limb threatening ischemia and places the patient at the risk for right lower extremity limb loss. ? ?Patient should undergo angiography of the right lower extremity, followed by the left lower extremity with the hope for intervention for limb salvage.  The risks and benefits as well as the alternative therapies was discussed in detail with the patient.  All questions were answered.  Patient agrees to proceed with bilateral lower extremity angiography. ? ?The patient will follow up with me in the office after the procedure.   ? ?2. Benign essential HTN ?Continue antihypertensive medications as already ordered, these medications have been reviewed and there are no changes at this time.  ? ?3. Hyperlipidemia, mixed ?Continue statin as ordered and reviewed, no  changes at this time  ? ?4. Smoker ?Smoking cessation was discussed, 3-10 minutes spent on this topic specifically  ? ? ?Current Outpatient Medications on File Prior to Visit  ?Medication Sig Dispense Refill  ? amLODipine (NORVASC) 10 MG tablet Take 10 mg by mouth daily.    ? aspirin EC 81 MG t

## 2021-10-02 NOTE — Progress Notes (Signed)
? ?Subjective:  ? ? Patient ID: Yvonne [name], female    DOB: 23-Feb-1956, 66 y.o.   MRN: [id number] ?Chief Complaint  ?Patient presents with  ? Establish Care  ?  Referred by Dr Gwen Pounds  ? ? ?[name] is a 66 year old female that presents today as a referral from her cardiologist, Dr. Liborio Nixon in regards to worsening claudication-like symptoms.  The patient notes that she has had claudication-like symptoms ongoing for the last year or so.  Recently over the last 6 months she has began develop rest pain like symptoms and she has not been able to get an adequate night sleep in that timeframe.  She notes that she has to get up and walk for the aching and throbbing in her legs during an active stop.  She also endorses having her leg off the side of the bed as well.  Recently the patient has had numbness and tingling of the lower extremities.  On evaluation of the lower extremities there are some darkened areas with areas concerning for beginning necrotic changes on her fifth toe as well as an area on her first toe of the right lower extremity. ? ?Today the patient has a right ABI of 0.52 with a left of 0.61 with monophasic tibial artery waveforms bilaterally.  Additional lower extremity arterial duplex show a more substantial narrowing in the right mid SFA with a 50 to 74% stenosis.  There is also contrast and velocities in the left mid SFA transition from triphasic to monophasic waveforms. ? ? ?Review of Systems  ?Skin:  Positive for wound.  ?All other systems reviewed and are negative. ? ?   ?Objective:  ? Physical Exam ?Vitals reviewed.  ?HENT:  ?   Head: Normocephalic.  ?Cardiovascular:  ?   Rate and Rhythm: Normal rate.  ?Pulmonary:  ?   Effort: Pulmonary effort is normal.  ?Skin: ?   General: Skin is warm and dry.  ?Neurological:  ?   Mental Status: She is alert and oriented to person, place, and time.  ?Psychiatric:     ?   Mood and Affect: Mood normal.     ?   Behavior: Behavior normal.     ?   Thought Content:  Thought content normal.     ?   Judgment: Judgment normal.  ? ? ?BP (!) 177/78 (BP Location: Right Arm)   Pulse 75   Resp 17   Ht 5\' 3"  (1.6 m)   Wt 105 lb (47.6 kg)   BMI 18.60 kg/m?  ? ?Past Medical History:  ?Diagnosis Date  ? Hypertension   ? Syncope   ? ? ?Social History  ? ?Socioeconomic History  ? Marital status: Divorced  ?  Spouse name: Not on file  ? Number of children: Not on file  ? Years of education: Not on file  ? Highest education level: Not on file  ?Occupational History  ? Not on file  ?Tobacco Use  ? Smoking status: Every Day  ?  Packs/day: 1.00  ?  Types: Cigarettes  ? Smokeless tobacco: Never  ?Substance and Sexual Activity  ? Alcohol use: Not on file  ? Drug use: Not on file  ? Sexual activity: Not on file  ?Other Topics Concern  ? Not on file  ?Social History Narrative  ? Not on file  ? ?Social Determinants of Health  ? ?Financial Resource Strain: Not on file  ?Food Insecurity: Not on file  ?Transportation Needs: Not on file  ?Physical Activity: Not  on file  ?Stress: Not on file  ?Social Connections: Not on file  ?Intimate Partner Violence: Not on file  ? ? ?History reviewed. No pertinent surgical history. ? ?History reviewed. No pertinent family history. ? ?No Known Allergies ? ? ?  Latest Ref Rng & Units 03/21/2021  ?  4:24 AM 03/20/2021  ?  2:28 PM 12/02/2019  ? 10:55 AM  ?CBC  ?WBC 4.0 - 10.5 K/uL 12.4   15.1   6.1    ?Hemoglobin 12.0 - 15.0 g/dL 84.112.9   32.413.6   40.113.1    ?Hematocrit 36.0 - 46.0 % 38.7   39.0   38.1    ?Platelets 150 - 400 K/uL 189   197   266    ? ? ? ? ?CMP  ?   ?Component Value Date/Time  ? NA 137 03/21/2021 0424  ? K 3.9 03/21/2021 0424  ? CL 102 03/21/2021 0424  ? CO2 25 03/21/2021 0424  ? GLUCOSE 113 (H) 03/21/2021 0424  ? BUN <5 (L) 03/21/2021 0424  ? CREATININE 0.49 03/21/2021 0424  ? CALCIUM 9.2 03/21/2021 0424  ? PROT 7.8 03/20/2021 1428  ? ALBUMIN 3.7 03/20/2021 1428  ? AST 24 03/20/2021 1428  ? ALT 23 03/20/2021 1428  ? ALKPHOS 83 03/20/2021 1428  ? BILITOT 1.0  03/20/2021 1428  ? GFRNONAA >60 03/21/2021 0424  ? GFRAA >60 12/02/2019 1055  ? ? ? ?VAS US ABI WITH/WO TBI ? ?Result Date: 09/18/2021 ? LOWER EXTREMITY DOPPLER STUDY Patient Name:  Orvan SeenKaren [name]  Date of Exam:   09/15/2021 Medical Rec #: [id number]    Accession #:    [id number] Date of Birth: Apr 08, 1956   Patient Gender: F Patient Age:   [age] years Exam Location:  [facility] Procedure:      VAS US ABI WITH/WO TBI Referring Phys: Festus BarrenJason Dew --------------------------------------------------------------------------------  Indications: Claudication, and rest pain.  Performing Technologist: Debbe BalesSolomon Mcclary RVS  Examination Guidelines: A complete evaluation includes at minimum, Doppler waveform signals and systolic blood pressure reading at the level of bilateral brachial, anterior tibial, and posterior tibial arteries, when vessel segments are accessible. Bilateral testing is considered an integral part of a complete examination. Photoelectric Plethysmograph (PPG) waveforms and toe systolic pressure readings are included as required and additional duplex testing as needed. Limited examinations for reoccurring indications may be performed as noted.  ABI Findings: +---------+------------------+-----+----------+--------+ Right    Rt Pressure (mmHg)IndexWaveform  Comment  +---------+------------------+-----+----------+--------+ Brachial 146                                       +---------+------------------+-----+----------+--------+ ATA      63                0.42 monophasic         +---------+------------------+-----+----------+--------+ PTA      79                0.52 monophasic         +---------+------------------+-----+----------+--------+ Great Toe36                0.24 Abnormal           +---------+------------------+-----+----------+--------+ +---------+------------------+-----+----------+-------+ Left     Lt Pressure (mmHg)IndexWaveform  Comment  +---------+------------------+-----+----------+-------+ Brachial 151                                      +---------+------------------+-----+----------+-------+  ATA      92                0.61 monophasic        +---------+------------------+-----+----------+-------+ PTA      88                0.58 monophasic        +---------+------------------+-----+----------+-------+ Great Toe95                0.63 Abnormal          +---------+------------------+-----+----------+-------+  Summary: Right: Resting right ankle-brachial index indicates moderate right lower extremity arterial disease. The right toe-brachial index is abnormal. Left: Resting left ankle-brachial index indicates moderate left lower extremity arterial disease. The left toe-brachial index is normal.  *See table(s) above for measurements and observations.  Electronically signed by [name] MD on 09/18/2021 at 3:20:26 PM.    Final   ? ? ?   ?Assessment & Plan:  ? ?1. Peripheral arterial disease (HCC) ? Recommend: ? ?The patient has evidence of severe atherosclerotic changes of both lower extremities associated with ulceration and tissue loss of the right foot.  This represents a limb threatening ischemia and places the patient at the risk for right lower extremity limb loss. ? ?Patient should undergo angiography of the right lower extremity, followed by the left lower extremity with the hope for intervention for limb salvage.  The risks and benefits as well as the alternative therapies was discussed in detail with the patient.  All questions were answered.  Patient agrees to proceed with bilateral lower extremity angiography. ? ?The patient will follow up with me in the office after the procedure.   ? ?2. Benign essential HTN ?Continue antihypertensive medications as already ordered, these medications have been reviewed and there are no changes at this time.  ? ?3. Hyperlipidemia, mixed ?Continue statin as ordered and reviewed, no  changes at this time  ? ?4. Smoker ?Smoking cessation was discussed, 3-10 minutes spent on this topic specifically  ? ? ?Current Outpatient Medications on File Prior to Visit  ?Medication Sig Dispense Refill  ? amLODipine (NORVASC) 10 MG tablet Take 10 mg by mouth daily.    ? aspirin EC 81 MG t

## 2021-10-03 ENCOUNTER — Encounter: Payer: Self-pay | Admitting: Vascular Surgery

## 2021-10-04 ENCOUNTER — Telehealth (INDEPENDENT_AMBULATORY_CARE_PROVIDER_SITE_OTHER): Payer: Self-pay

## 2021-10-04 ENCOUNTER — Other Ambulatory Visit (INDEPENDENT_AMBULATORY_CARE_PROVIDER_SITE_OTHER): Payer: Self-pay | Admitting: Nurse Practitioner

## 2021-10-04 MED ORDER — HYDROCODONE-ACETAMINOPHEN 5-325 MG PO TABS: 1.0000 | ORAL_TABLET | Freq: Four times a day (QID) | ORAL | 0 refills | Status: DC | PRN

## 2021-10-04 NOTE — Telephone Encounter (Signed)
Patient called in stating she would like something for pain following her RLE angio with Dr. Wyn Quaker. Patient stated she didn't sleep all night. Please advise. Thank you ?

## 2021-10-04 NOTE — Telephone Encounter (Signed)
Sent in hydrocodone to her [facility] pharmacy

## 2021-10-04 NOTE — Telephone Encounter (Signed)
Spoke with the patient and let her know that pain medication was sent to her pharmacy at [facility]. See notes below. ?

## 2021-10-09 ENCOUNTER — Encounter
Admission: RE | Disposition: A | Discharge status: Home / Self Care | Payer: Self-pay | Source: Home / Self Care | Attending: Vascular Surgery

## 2021-10-09 ENCOUNTER — Other Ambulatory Visit: Payer: Self-pay

## 2021-10-09 ENCOUNTER — Ambulatory Visit
Admission: RE | Admit: 2021-10-09 | Discharge: 2021-10-09 | Disposition: A | Discharge status: Home / Self Care | Payer: Medicare Other | Attending: Vascular Surgery | Admitting: Vascular Surgery

## 2021-10-09 ENCOUNTER — Encounter: Payer: Self-pay | Admitting: Vascular Surgery

## 2021-10-09 DIAGNOSIS — I701 Atherosclerosis of renal artery: Secondary | ICD-10-CM | POA: Diagnosis not present

## 2021-10-09 DIAGNOSIS — I7092 Chronic total occlusion of artery of the extremities: Secondary | ICD-10-CM

## 2021-10-09 DIAGNOSIS — I70235 Atherosclerosis of native arteries of right leg with ulceration of other part of foot: Secondary | ICD-10-CM | POA: Insufficient documentation

## 2021-10-09 DIAGNOSIS — F17210 Nicotine dependence, cigarettes, uncomplicated: Secondary | ICD-10-CM | POA: Insufficient documentation

## 2021-10-09 DIAGNOSIS — E782 Mixed hyperlipidemia: Secondary | ICD-10-CM | POA: Diagnosis not present

## 2021-10-09 DIAGNOSIS — I10 Essential (primary) hypertension: Secondary | ICD-10-CM | POA: Diagnosis not present

## 2021-10-09 DIAGNOSIS — L97519 Non-pressure chronic ulcer of other part of right foot with unspecified severity: Secondary | ICD-10-CM | POA: Insufficient documentation

## 2021-10-09 DIAGNOSIS — I739 Peripheral vascular disease, unspecified: Secondary | ICD-10-CM

## 2021-10-09 DIAGNOSIS — I70222 Atherosclerosis of native arteries of extremities with rest pain, left leg: Secondary | ICD-10-CM | POA: Diagnosis not present

## 2021-10-09 HISTORY — PX: LOWER EXTREMITY ANGIOGRAPHY: CATH118251

## 2021-10-09 LAB — BUN: BUN: 6 mg/dL — ABNORMAL LOW (ref 8–23)

## 2021-10-09 LAB — CREATININE, SERUM
Creatinine, Ser: 0.47 mg/dL (ref 0.44–1.00)
GFR, Estimated: >60 mL/min (ref >60)

## 2021-10-09 SURGERY — LOWER EXTREMITY ANGIOGRAPHY
Anesthesia: Moderate Sedation | Site: Leg Lower | Laterality: Left

## 2021-10-09 MED ORDER — FENTANYL CITRATE (PF) 100 MCG/2ML IJ SOLN
INTRAMUSCULAR | Status: DC | PRN
  Administered 2021-10-09: 50 ug via INTRAVENOUS

## 2021-10-09 MED ORDER — MIDAZOLAM HCL 2 MG/ML PO SYRP: 8.0000 mg | ORAL_SOLUTION | Freq: Once | ORAL | Status: DC | PRN

## 2021-10-09 MED ORDER — CEFAZOLIN SODIUM-DEXTROSE 2-4 GM/100ML-% IV SOLN
INTRAVENOUS | Status: AC
  Filled 2021-10-09: qty 100

## 2021-10-09 MED ORDER — ONDANSETRON HCL 4 MG/2ML IJ SOLN: 4.0000 mg | Freq: Four times a day (QID) | INTRAMUSCULAR | Status: DC | PRN

## 2021-10-09 MED ORDER — FENTANYL CITRATE PF 50 MCG/ML IJ SOSY
PREFILLED_SYRINGE | INTRAMUSCULAR | Status: AC
  Filled 2021-10-09: qty 2

## 2021-10-09 MED ORDER — IODIXANOL 320 MG/ML IV SOLN
INTRAVENOUS | Status: DC | PRN
  Administered 2021-10-09: 65 mL via INTRA_ARTERIAL

## 2021-10-09 MED ORDER — MIDAZOLAM HCL 2 MG/2ML IJ SOLN
INTRAMUSCULAR | Status: DC | PRN
  Administered 2021-10-09: 2 mg via INTRAVENOUS

## 2021-10-09 MED ORDER — MIDAZOLAM HCL 5 MG/5ML IJ SOLN
INTRAMUSCULAR | Status: AC
  Filled 2021-10-09: qty 5

## 2021-10-09 MED ORDER — DIPHENHYDRAMINE HCL 50 MG/ML IJ SOLN
50.0000 mg | Freq: Once | INTRAMUSCULAR | Status: DC | PRN
Start: 2021-10-09 — End: 2021-10-09

## 2021-10-09 MED ORDER — SODIUM CHLORIDE 0.9 % IV SOLN: INTRAVENOUS | Status: DC

## 2021-10-09 MED ORDER — CEFAZOLIN SODIUM-DEXTROSE 2-4 GM/100ML-% IV SOLN: 2.0000 g | INTRAVENOUS | Status: DC

## 2021-10-09 MED ORDER — HEPARIN SODIUM (PORCINE) 1000 UNIT/ML IJ SOLN
INTRAMUSCULAR | Status: DC | PRN
  Administered 2021-10-09: 4000 [IU] via INTRAVENOUS

## 2021-10-09 MED ORDER — FAMOTIDINE 20 MG PO TABS: 40.0000 mg | ORAL_TABLET | Freq: Once | ORAL | Status: DC | PRN

## 2021-10-09 MED ORDER — HYDROMORPHONE HCL 1 MG/ML IJ SOLN: 1.0000 mg | Freq: Once | INTRAMUSCULAR | Status: DC | PRN

## 2021-10-09 MED ORDER — METHYLPREDNISOLONE SODIUM SUCC 125 MG IJ SOLR: 125.0000 mg | Freq: Once | INTRAMUSCULAR | Status: DC | PRN

## 2021-10-09 MED ORDER — NITROGLYCERIN 1 MG/10 ML FOR IR/CATH LAB
INTRA_ARTERIAL | Status: DC | PRN
  Administered 2021-10-09: 300 ug

## 2021-10-09 MED ORDER — NITROGLYCERIN 1 MG/10 ML FOR IR/CATH LAB
INTRA_ARTERIAL | Status: AC
  Filled 2021-10-09: qty 10

## 2021-10-09 MED ORDER — CEFAZOLIN SODIUM-DEXTROSE 1-4 GM/50ML-% IV SOLN
INTRAVENOUS | Status: AC | PRN
  Administered 2021-10-09: 2 g via INTRAVENOUS

## 2021-10-09 MED ORDER — HEPARIN SODIUM (PORCINE) 1000 UNIT/ML IJ SOLN
INTRAMUSCULAR | Status: AC
  Filled 2021-10-09: qty 10

## 2021-10-09 SURGICAL SUPPLY — 29 items
BALLN LUTONIX  018 4X60X130 (BALLOONS) ×1
BALLN LUTONIX 018 4X60X130 (BALLOONS) ×1
BALLN LUTONIX 018 4X80X130 (BALLOONS) ×2
BALLN LUTONIX 018 5X40X130 (BALLOONS) ×2
BALLN ULTRVRSE 2.5X220X150 (BALLOONS) ×2
BALLN ULTRVRSE 2.5X300X150 (BALLOONS) ×2
BALLN ULTRVRSE 2X100X150 (BALLOONS) ×2
BALLOON LUTONIX 018 4X60X130 (BALLOONS) IMPLANT
BALLOON LUTONIX 018 4X80X130 (BALLOONS) IMPLANT
BALLOON LUTONIX 018 5X40X130 (BALLOONS) IMPLANT
BALLOON ULTRVRSE 2.5X220X150 (BALLOONS) IMPLANT
BALLOON ULTRVRSE 2.5X300X150 (BALLOONS) IMPLANT
BALLOON ULTRVRSE 2X100X150 (BALLOONS) IMPLANT
CATH ANGIO 5F PIGTAIL 65CM (CATHETERS) ×1 IMPLANT
CATH NAVICROSS ANGLED 135CM (MICROCATHETER) ×1 IMPLANT
CATH VERT 5X100 (CATHETERS) ×1 IMPLANT
COVER PROBE U/S 5X48 (MISCELLANEOUS) ×1 IMPLANT
DEVICE STARCLOSE SE CLOSURE (Vascular Products) ×1 IMPLANT
GLIDEWIRE ADV .035X260CM (WIRE) ×1 IMPLANT
GUIDEWIRE PFTE-COATED .018X300 (WIRE) ×1 IMPLANT
KIT ENCORE 26 ADVANTAGE (KITS) ×1 IMPLANT
PACK ANGIOGRAPHY (CUSTOM PROCEDURE TRAY) ×2 IMPLANT
SHEATH ANL2 6FRX45 HC (SHEATH) ×1 IMPLANT
SHEATH BRITE TIP 5FRX11 (SHEATH) ×1 IMPLANT
STENT VIABAHN 6X25X120 (Permanent Stent) ×1 IMPLANT
SYR MEDRAD MARK 7 150ML (SYRINGE) ×1 IMPLANT
TUBING CONTRAST HIGH PRESS 72 (TUBING) ×1 IMPLANT
WIRE G V18X300CM (WIRE) ×1 IMPLANT
WIRE GUIDERIGHT .035X150 (WIRE) ×1 IMPLANT

## 2021-10-09 NOTE — Op Note (Signed)
[facility] ? Percutaneous Study/Intervention Procedural Note ? ? ?Date of Surgery: 10/09/2021 ? ?Surgeon(s):DEW,JASON   ? ?Assistants:none ? ?Pre-operative Diagnosis: PAD with rest [name] left lower extremity, ulceration right lower extremity status post right lower extremity revascularization ? ?Post-operative diagnosis:  Same ? ?Procedure(s) Performed: ?            1.  Ultrasound guidance for vascular access right femoral artery ?            2.  Catheter placement into left common femoral artery from right femoral approach ?            3.  Aortogram and selective left lower extremity angiogram ?            4.  Percutaneous transluminal angioplasty of left anterior tibial artery with 2 and 2-1/2 mm diameter angioplasty balloon ?            5.  Percutaneous transluminal angioplasty of the left peroneal artery and tibioperoneal trunk with 2 and 2-1/2 mm diameter angioplasty balloon ? 6.  Percutaneous transluminal angioplasty of the left popliteal artery with 4 mm diameter Lutonix drug-coated angioplasty balloon ? 7.  Percutaneous transluminal angioplasty of left proximal SFA with 4 mm diameter Lutonix drug-coated angioplasty balloon ? 8.  Viabahn stent placement to the proximal left SFA with 6 mm diameter by 2.5 cm length stent for residual stenosis after angioplasty ?            9.  StarClose closure device right femoral artery ? ?EBL: 5 cc ? ?Contrast: 65 cc ? ?Fluoro Time: 10.3 minutes ? ?Moderate Conscious Sedation Time: approximately 75 minutes using 2 mg of Versed and 50 mcg of Fentanyl ?             ?Indications:  Patient is a 66 y.o.female with significant peripheral arterial disease status post right lower extremity revascularization for ulceration. The patient has noninvasive study showing reduced perfusion bilaterally and she has symptoms worrisome for rest [name] on the left. The patient is brought in for angiography for further evaluation and potential treatment.  Due to the limb  threatening nature of the situation, angiogram was performed for attempted limb salvage. The patient is aware that if the procedure fails, amputation would be expected.  The patient also understands that even with successful revascularization, amputation may still be required due to the severity of the situation.  Risks and benefits are discussed and informed consent is obtained.  ? ?Procedure:  The patient was identified and appropriate procedural time out was performed.  The patient was then placed supine on the table and prepped and draped in the usual sterile fashion. Moderate conscious sedation was administered during a face to face encounter with the patient throughout the procedure with my supervision of the RN administering medicines and monitoring the patient's vital signs, pulse oximetry, telemetry and mental status throughout from the start of the procedure until the patient was taken to the recovery room. Ultrasound was used to evaluate the right common femoral artery.  It was patent .  A digital ultrasound image was acquired.  A Seldinger needle was used to access the right common femoral artery under direct ultrasound guidance and a permanent image was performed.  A 0.035 J wire was advanced without resistance and a 5Fr sheath was placed.  Pigtail catheter was placed into the aorta and an AP aortogram was performed. This demonstrated better the right renal artery stenosis that appeared to be greater than 60%  and was seen better than it was on the aortogram last week.  The left renal artery did not have significant stenosis and normal aorta and iliac segments without significant stenosis. I then crossed the aortic bifurcation and advanced to the left femoral head. Selective left lower extremity angiogram was then performed. This demonstrated This demonstrated better the right renal artery stenosis that appeared to be greater than 60% and was seen better than it was on the aortogram last week.  The left  renal artery did not have significant stenosis and normal aorta and iliac segments without significant stenosis. It was felt that it was in the patient's best interest to proceed with intervention after these images to avoid a second procedure and a larger amount of contrast and fluoroscopy based off of the findings from the initial angiogram. The patient was systemically heparinized and a 6 [place] Ansell sheath was then placed over the [employer] wire. I then used a Kumpe catheter and the advantage wire to navigate down through the SFA and popliteal lesions and get into the anterior tibial artery.  We then exchanged for a Ferd Hibbs cross and then a seeker catheter and a V18 wire and cross the stenosis in the anterior tibial artery parking the wire in the foot.  This was then treated with a 2 and a 2-1/2 mm diameter angioplasty balloon inflated to 12 atm with a 2-1/2 mm balloon and held for greater than 1 minute.  Completion imaging showed residual stenosis approaching 50% but there was also some wall irregularity and mild extravasation initially and so upsizing for small vessels was not felt to be appropriate.  Treated with a 2 mm balloon with inflation held for 2-minute with some improvement good flow seen distally with about a 40% residual stenosis.  I then used a Ferd Hibbs cross catheter and a 0.018 advantage wire and cross the occlusion of the tibioperoneal trunk and peroneal artery and parked the wire distally.  I then proceeded with treatment first with a 2-1/2 mm diameter by 22 cm length angioplasty balloon inflated to 10 atm for 1 minute.  Once again, there was some irregularity in the proximal peroneal artery and distal PT trunk and we tapped this down with a 2 mm diameter by 10 cm length angioplasty balloon with inflation held for 2 minutes.  Completion imaging showed significant spasm distally in the peroneal artery but the areas treated had less than 25% residual stenosis.  I then turned my attention to the  more proximal disease.  The above-knee popliteal artery was treated with a 4 mm diameter by 8 cm length Lutonix drug-coated angioplasty balloon inflated to 8 atm for 1 minute.  Completion imaging showed about a 20% residual stenosis.  The proximal SFA was then treated with a 4 mm diameter by 6 cm length Lutonix drug-coated angioplasty balloon inflated to 10 atm for 1 minute but there was suboptimal result with greater than 50% residual stenosis after this angioplasty.  I then placed a 6 mm diameter by 2.5 cm length Viabahn stent in the proximal SFA and postdilated with a 5 mm balloon with excellent angiographic ablation result and less than 10% residual stenosis. I elected to terminate the procedure. The sheath was removed and StarClose closure device was deployed in the right femoral artery with excellent hemostatic result. The patient was taken to the recovery room in stable condition having tolerated the procedure well. ? ?Findings:   ?  Aortogram:  This demonstrated better the right renal artery stenosis that appeared to be greater than 60% and was seen better than it was on the aortogram last week.  The left renal artery did not have significant stenosis and normal aorta and iliac segments without significant stenosis. ?            Left lower Extremity: Disease in the distal common femoral artery and proximal SFA with greater than 80% stenosis in the proximal left SFA.  The profunda femoris was small but not terribly stenotic.  The remainder of the SFA was fairly normal but the above-knee popliteal artery had greater than 70% stenosis.  There was then severe tibial disease.  The anterior tibial artery was occluded in the proximal to proximal to mid segment but reconstituted and was continuous distally.  The tibioperoneal trunk and proximal peroneal artery were occluded but this reconstituted in the mid segment as well and provided additional runoff distally.  The posterior tibial artery was  chronically occluded without distal reconstitution. ? ? ?Disposition: Patient was taken to the recovery room in stable condition having tolerated the procedure well. ? ?Complications: None ? ?Yvonne [name] ?10/09/2021

## 2021-10-09 NOTE — Interval H&P Note (Signed)
History and Physical Interval Note: ? ?10/09/2021 ?8:24 AM ? ?Yvonne [name]  has presented today for surgery, with the diagnosis of LLE Angio   BARD   PVD.  The various methods of treatment have been discussed with the patient and family. After consideration of risks, benefits and other options for treatment, the patient has consented to  Procedure(s): ?Lower Extremity Angiography (Left) as a surgical intervention.  The patient's history has been reviewed, patient examined, no change in status, stable for surgery.  I have reviewed the patient's chart and labs.  Questions were answered to the patient's satisfaction.   ? ? ?[name] ? ? ?

## 2021-10-25 ENCOUNTER — Other Ambulatory Visit (INDEPENDENT_AMBULATORY_CARE_PROVIDER_SITE_OTHER): Payer: Self-pay | Admitting: Vascular Surgery

## 2021-10-25 DIAGNOSIS — I70239 Atherosclerosis of native arteries of right leg with ulceration of unspecified site: Secondary | ICD-10-CM

## 2021-10-25 DIAGNOSIS — Z95820 Peripheral vascular angioplasty status with implants and grafts: Secondary | ICD-10-CM

## 2021-10-26 ENCOUNTER — Ambulatory Visit (INDEPENDENT_AMBULATORY_CARE_PROVIDER_SITE_OTHER): Payer: Medicare Other

## 2021-10-26 ENCOUNTER — Other Ambulatory Visit (INDEPENDENT_AMBULATORY_CARE_PROVIDER_SITE_OTHER): Payer: Self-pay | Admitting: Vascular Surgery

## 2021-10-26 ENCOUNTER — Encounter (INDEPENDENT_AMBULATORY_CARE_PROVIDER_SITE_OTHER): Payer: Self-pay | Admitting: Nurse Practitioner

## 2021-10-26 ENCOUNTER — Ambulatory Visit (INDEPENDENT_AMBULATORY_CARE_PROVIDER_SITE_OTHER): Payer: Medicare Other | Admitting: Nurse Practitioner

## 2021-10-26 VITALS — BP 133/77 | HR 87 | Resp 16 | Ht 63.0 in | Wt 102.0 lb

## 2021-10-26 DIAGNOSIS — Z95820 Peripheral vascular angioplasty status with implants and grafts: Secondary | ICD-10-CM | POA: Diagnosis not present

## 2021-10-26 DIAGNOSIS — M79604 Pain in right leg: Secondary | ICD-10-CM

## 2021-10-26 DIAGNOSIS — I70239 Atherosclerosis of native arteries of right leg with ulceration of unspecified site: Secondary | ICD-10-CM

## 2021-10-26 DIAGNOSIS — I10 Essential (primary) hypertension: Secondary | ICD-10-CM

## 2021-10-26 DIAGNOSIS — I70249 Atherosclerosis of native arteries of left leg with ulceration of unspecified site: Secondary | ICD-10-CM

## 2021-10-26 DIAGNOSIS — E782 Mixed hyperlipidemia: Secondary | ICD-10-CM

## 2021-10-26 NOTE — H&P (View-Only) (Signed)
 Subjective:    Patient ID: Yvonne [name], female    DOB: 08/31/1955, 66 y.o.   MRN: [id number] Chief Complaint  Patient presents with   Follow-up    ultrasound    The patient returns to the office for followup and review of the noninvasive studies.   The patient notes that there has been a significant deterioration in the lower extremity symptoms.  The patient notes interval shortening of their claudication distance and development of severe rest pain symptoms. No new ulcers or wounds have occurred since the last visit.  There have been no significant changes to the patient's overall health care.  The patient denies amaurosis fugax or recent TIA symptoms. There are no recent neurological changes noted. There is no history of DVT, PE or superficial thrombophlebitis. The patient denies recent episodes of angina or shortness of breath.   The patient has right ABI 0.30 with a TBI 0.14.  This is distinctly decreased from the previous ABI 0.52 on the right.  It is noted that on ultrasound today there is occlusion of the SFA with dampened monophasic flow in the tibial arteries and severely dampened flow to the tendons.   Review of Systems     Objective:   Physical Exam  BP 133/77 (BP Location: Right Arm)   Pulse 87   Resp 16   Ht 5' 3" (1.6 m)   Wt 102 lb (46.3 kg)   BMI 18.07 kg/m   Past Medical History:  Diagnosis Date   Hyperlipidemia    Hypertension    Peripheral vascular disease (HCC)    Syncope     Social History   Socioeconomic History   Marital status: Divorced    Spouse name: Not on file   Number of children: Not on file   Years of education: Not on file   Highest education level: Not on file  Occupational History   Not on file  Tobacco Use   Smoking status: Every Day    Packs/day: 1.00    Types: Cigarettes   Smokeless tobacco: Never  Substance and Sexual Activity   Alcohol use: Yes    Alcohol/week: 6.0 standard drinks    Types: 6 Glasses of wine per  week   Drug use: Not on file   Sexual activity: Not on file  Other Topics Concern   Not on file  Social History Narrative   Not on file   Social Determinants of Health   Financial Resource Strain: Not on file  Food Insecurity: Not on file  Transportation Needs: Not on file  Physical Activity: Not on file  Stress: Not on file  Social Connections: Not on file  Intimate Partner Violence: Not on file    Past Surgical History:  Procedure Laterality Date   LOWER EXTREMITY ANGIOGRAPHY Right 10/02/2021   Procedure: Lower Extremity Angiography;  Surgeon: Dew, Jason S, MD;  Location: [facility];  Service: Cardiovascular;  Laterality: Right;   LOWER EXTREMITY ANGIOGRAPHY Left 10/09/2021   Procedure: Lower Extremity Angiography;  Surgeon: Dew, Jason S, MD;  Location: [facility];  Service: Cardiovascular;  Laterality: Left;   PARTIAL HYSTERECTOMY      No family history on file.  No Known Allergies     Latest Ref Rng & Units 03/21/2021    4:24 AM 03/20/2021    2:28 PM 12/02/2019   10:55 AM  CBC  WBC 4.0 - 10.5 K/uL 12.4   15.1   6.1    Hemoglobin 12.0 -   15.0 g/dL 12.9   13.6   13.1    Hematocrit 36.0 - 46.0 % 38.7   39.0   38.1    Platelets 150 - 400 K/uL 189   197   266        CMP     Component Value Date/Time   NA 137 03/21/2021 0424   K 3.9 03/21/2021 0424   CL 102 03/21/2021 0424   CO2 25 03/21/2021 0424   GLUCOSE 113 (H) 03/21/2021 0424   BUN 6 (L) 10/09/2021 0828   CREATININE 0.47 10/09/2021 0828   CALCIUM 9.2 03/21/2021 0424   PROT 7.8 03/20/2021 1428   ALBUMIN 3.7 03/20/2021 1428   AST 24 03/20/2021 1428   ALT 23 03/20/2021 1428   ALKPHOS 83 03/20/2021 1428   BILITOT 1.0 03/20/2021 1428   GFRNONAA >60 10/09/2021 0828   GFRAA >60 12/02/2019 1055     VAS US ABI WITH/WO TBI  Result Date: 09/18/2021  LOWER EXTREMITY DOPPLER STUDY Patient Name:  Yvonne [name]  Date of Exam:   09/15/2021 Medical Rec #: [id number]    Accession #:    [id number] Date  of Birth: 12/21/1955   Patient Gender: F Patient Age:   66 years Exam Location:  [facility] Procedure:      VAS US ABI WITH/WO TBI Referring Phys: Jason Dew --------------------------------------------------------------------------------  Indications: Claudication, and rest pain.  Performing Technologist: Solomon Mcclary RVS  Examination Guidelines: A complete evaluation includes at minimum, Doppler waveform signals and systolic blood pressure reading at the level of bilateral brachial, anterior tibial, and posterior tibial arteries, when vessel segments are accessible. Bilateral testing is considered an integral part of a complete examination. Photoelectric Plethysmograph (PPG) waveforms and toe systolic pressure readings are included as required and additional duplex testing as needed. Limited examinations for reoccurring indications may be performed as noted.  ABI Findings: +---------+------------------+-----+----------+--------+ Right    Rt Pressure (mmHg)IndexWaveform  Comment  +---------+------------------+-----+----------+--------+ Brachial 146                                       +---------+------------------+-----+----------+--------+ ATA      63                0.42 monophasic         +---------+------------------+-----+----------+--------+ PTA      79                0.52 monophasic         +---------+------------------+-----+----------+--------+ Great Toe36                0.24 Abnormal           +---------+------------------+-----+----------+--------+ +---------+------------------+-----+----------+-------+ Left     Lt Pressure (mmHg)IndexWaveform  Comment +---------+------------------+-----+----------+-------+ Brachial 151                                      +---------+------------------+-----+----------+-------+ ATA      92                0.61 monophasic        +---------+------------------+-----+----------+-------+ PTA      88                 0.58 monophasic        +---------+------------------+-----+----------+-------+ Great Toe95                  0.63 Abnormal          +---------+------------------+-----+----------+-------+  Summary: Right: Resting right ankle-brachial index indicates moderate right lower extremity arterial disease. The right toe-brachial index is abnormal. Left: Resting left ankle-brachial index indicates moderate left lower extremity arterial disease. The left toe-brachial index is normal.  *See table(s) above for measurements and observations.  Electronically signed by Jason Dew MD on 09/18/2021 at 3:20:26 PM.    Final        Assessment & Plan:   1. 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  This represents a limb threatening ischemia and places the patient at the risk for right lower extremity limb loss.  Patient should undergo angiography of the right lower extremity with the hope for intervention for limb salvage.  The risks and benefits as well as the alternative therapies was discussed in detail with the patient.  All questions were answered.  Patient agrees to proceed with right lower extremity angiography.  The patient will follow up with me in the office after the procedure.   Persistent smoking cessation is also discussed intervention with the patient.     - VAS US ABI WITH/WO TBI - Procedural/ Surgical Case Request: Lower Extremity Angiography - BUN - Creatinine  2. Hyperlipidemia, mixed Continue statin as ordered and reviewed, no changes at this time   3. Benign essential HTN Continue antihypertensive medications as already ordered, these medications have been reviewed and there are no changes at this time.      Current Outpatient Medications on File Prior to Visit  Medication Sig Dispense Refill   amLODipine (NORVASC) 10 MG tablet Take 10 mg by mouth daily.     aspirin EC 81 MG tablet Take 81 mg by mouth daily. Swallow whole.     atorvastatin (LIPITOR) 10 MG tablet Take 10 mg by mouth daily.     cholecalciferol (VITAMIN D3) 25 MCG (1000 UNIT) tablet Take 1,000 Units by mouth daily.     clopidogrel (PLAVIX) 75 MG tablet Take 1 tablet (75 mg total) by mouth daily. 30 tablet 11   HYDROcodone-acetaminophen (NORCO/VICODIN) 5-325 MG tablet Take 1 tablet by mouth every 6 (six) hours as needed for moderate pain. 20 tablet 0   metoprolol tartrate (LOPRESSOR) 25 MG tablet Take 12.5 mg by mouth 2 (two) times daily.     ondansetron (ZOFRAN) 4 MG tablet Take 1 tablet (4 mg total) by mouth daily as needed for nausea or vomiting. 15 tablet 0   No current facility-administered medications on file prior to visit.    There are no Patient Instructions on file for this visit. No follow-ups on file.   Lothar Prehn E Tamiah Dysart, NP   

## 2021-10-26 NOTE — Progress Notes (Unsigned)
Subjective:    Patient ID: Yvonne [name], female    DOB: 1955/06/10, 66 y.o.   MRN: [id number] Chief Complaint  Patient presents with   Follow-up    ultrasound    The patient returns to the office for followup and review of the noninvasive studies.   The patient notes that there has been a significant deterioration in the lower extremity symptoms.  The patient notes interval shortening of their claudication distance and development of severe rest [name] symptoms. No new ulcers or wounds have occurred since the last visit.  There have been no significant changes to the patient's overall health care.  The patient denies amaurosis fugax or recent TIA symptoms. There are no recent neurological changes noted. There is no history of DVT, PE or superficial thrombophlebitis. The patient denies recent episodes of angina or shortness of breath.   The patient has right ABI 0.30 with a TBI 0.14.  This is distinctly decreased from the previous ABI 0.52 on the right.  It is noted that on ultrasound today there is occlusion of the SFA with dampened monophasic flow in the tibial arteries and severely dampened flow to the tendons.   Review of Systems     Objective:   Physical Exam  BP 133/77 (BP Location: Right Arm)   Pulse 87   Resp 16   Ht 5\' 3"  (1.6 m)   Wt 102 lb (46.3 kg)   BMI 18.07 kg/m   Past Medical History:  Diagnosis Date   Hyperlipidemia    Hypertension    Peripheral vascular disease ([place])    Syncope     Social History   Socioeconomic History   Marital status: Divorced    Spouse name: Not on file   Number of children: Not on file   Years of education: Not on file   Highest education level: Not on file  Occupational History   Not on file  Tobacco Use   Smoking status: Every Day    Packs/day: 1.00    Types: Cigarettes   Smokeless tobacco: Never  Substance and Sexual Activity   Alcohol use: Yes    Alcohol/week: 6.0 standard drinks    Types: 6 Glasses of wine per  week   Drug use: Not on file   Sexual activity: Not on file  Other Topics Concern   Not on file  Social History Narrative   Not on file   Social Determinants of Health   Financial Resource Strain: Not on file  Food Insecurity: Not on file  Transportation Needs: Not on file  Physical Activity: Not on file  Stress: Not on file  Social Connections: Not on file  Intimate Partner Violence: Not on file    Past Surgical History:  Procedure Laterality Date   LOWER EXTREMITY ANGIOGRAPHY Right 10/02/2021   Procedure: Lower Extremity Angiography;  Surgeon: Algernon Huxley, MD;  Location: [facility];  Service: Cardiovascular;  Laterality: Right;   LOWER EXTREMITY ANGIOGRAPHY Left 10/09/2021   Procedure: Lower Extremity Angiography;  Surgeon: Algernon Huxley, MD;  Location: [facility];  Service: Cardiovascular;  Laterality: Left;   PARTIAL HYSTERECTOMY      No family history on file.  No Known Allergies     Latest Ref Rng & Units 03/21/2021    4:24 AM 03/20/2021    2:28 PM 12/02/2019   10:55 AM  CBC  WBC 4.0 - 10.5 K/uL 12.4   15.1   6.1    Hemoglobin 12.0 -  15.0 g/dL 12.9   13.6   13.1    Hematocrit 36.0 - 46.0 % 38.7   39.0   38.1    Platelets 150 - 400 K/uL 189   197   266        CMP     Component Value Date/Time   NA 137 03/21/2021 0424   K 3.9 03/21/2021 0424   CL 102 03/21/2021 0424   CO2 25 03/21/2021 0424   GLUCOSE 113 (H) 03/21/2021 0424   BUN 6 (L) 10/09/2021 0828   CREATININE 0.47 10/09/2021 0828   CALCIUM 9.2 03/21/2021 0424   PROT 7.8 03/20/2021 1428   ALBUMIN 3.7 03/20/2021 1428   AST 24 03/20/2021 1428   ALT 23 03/20/2021 1428   ALKPHOS 83 03/20/2021 1428   BILITOT 1.0 03/20/2021 1428   GFRNONAA >60 10/09/2021 0828   GFRAA >60 12/02/2019 1055     VAS [place] ABI WITH/WO TBI  Result Date: 09/18/2021  LOWER EXTREMITY DOPPLER STUDY Patient Name:  Yvonne [name]  Date of Exam:   09/15/2021 Medical Rec #: [id number]    Accession #:    [id number] Date  of Birth: 05-11-1956   Patient Gender: F Patient Age:   39 years Exam Location:  [facility] Procedure:      VAS [place] ABI WITH/WO TBI Referring Phys: Yvonne [name] --------------------------------------------------------------------------------  Indications: Claudication, and rest [name].  Performing Technologist: Yvonne [name] RVS  Examination Guidelines: A complete evaluation includes at minimum, Doppler waveform signals and systolic blood pressure reading at the level of bilateral brachial, anterior tibial, and posterior tibial arteries, when vessel segments are accessible. Bilateral testing is considered an integral part of a complete examination. Photoelectric Plethysmograph (PPG) waveforms and toe systolic pressure readings are included as required and additional duplex testing as needed. Limited examinations for reoccurring indications may be performed as noted.  ABI Findings: +---------+------------------+-----+----------+--------+ Right    Rt Pressure (mmHg)IndexWaveform  Comment  +---------+------------------+-----+----------+--------+ Brachial 146                                       +---------+------------------+-----+----------+--------+ ATA      63                0.42 monophasic         +---------+------------------+-----+----------+--------+ PTA      79                0.52 monophasic         +---------+------------------+-----+----------+--------+ Great Toe36                0.24 Abnormal           +---------+------------------+-----+----------+--------+ +---------+------------------+-----+----------+-------+ Left     Lt Pressure (mmHg)IndexWaveform  Comment +---------+------------------+-----+----------+-------+ Brachial 151                                      +---------+------------------+-----+----------+-------+ ATA      92                0.61 monophasic        +---------+------------------+-----+----------+-------+ PTA      88                 0.58 monophasic        +---------+------------------+-----+----------+-------+ Yvonne [name]  0.63 Abnormal          +---------+------------------+-----+----------+-------+  Summary: Right: Resting right ankle-brachial index indicates moderate right lower extremity arterial disease. The right toe-brachial index is abnormal. Left: Resting left ankle-brachial index indicates moderate left lower extremity arterial disease. The left toe-brachial index is normal.  *See table(s) above for measurements and observations.  Electronically signed by Yvonne Pain MD on 09/18/2021 at 3:20:26 PM.    Final        Assessment & Plan:   1. 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  This represents a limb threatening ischemia and places the patient at the risk for right lower extremity limb loss.  Patient should undergo angiography of the right lower extremity with the hope for intervention for limb salvage.  The risks and benefits as well as the alternative therapies was discussed in detail with the patient.  All questions were answered.  Patient agrees to proceed with right lower extremity angiography.  The patient will follow up with me in the office after the procedure.   Persistent smoking cessation is also discussed intervention with the patient.     - VAS [place] ABI WITH/WO TBI - Procedural/ Surgical Case Request: Lower Extremity Angiography - BUN - Creatinine  2. Hyperlipidemia, mixed Continue statin as ordered and reviewed, no changes at this time   3. Benign essential HTN Continue antihypertensive medications as already ordered, these medications have been reviewed and there are no changes at this time.      Current Outpatient Medications on File Prior to Visit  Medication Sig Dispense Refill   amLODipine (NORVASC) 10 MG tablet Take 10 mg by mouth daily.     aspirin EC 81 MG tablet Take 81 mg by mouth daily. Swallow whole.     atorvastatin (LIPITOR) 10 MG tablet Take 10 mg by mouth daily.     cholecalciferol (VITAMIN D3) 25 MCG (1000 UNIT) tablet Take 1,000 Units by mouth daily.     clopidogrel (PLAVIX) 75 MG tablet Take 1 tablet (75 mg total) by mouth daily. 30 tablet 11   HYDROcodone-acetaminophen (NORCO/VICODIN) 5-325 MG tablet Take 1 tablet by mouth every 6 (six) hours as needed for moderate [name]. 20 tablet 0   metoprolol tartrate (LOPRESSOR) 25 MG tablet Take 12.5 mg by mouth 2 (two) times daily.     ondansetron (ZOFRAN) 4 MG tablet Take 1 tablet (4 mg total) by mouth daily as needed for nausea or vomiting. 15 tablet 0   No current facility-administered medications on file prior to visit.    There are no Patient Instructions on file for this visit. No follow-ups on file.   Kris Hartmann, NP

## 2021-10-27 ENCOUNTER — Other Ambulatory Visit: Payer: Self-pay

## 2021-10-27 ENCOUNTER — Encounter: Payer: Self-pay | Admitting: Vascular Surgery

## 2021-10-27 ENCOUNTER — Observation Stay
Admission: RE | Admit: 2021-10-27 | Discharge: 2021-10-28 | Disposition: A | Discharge status: Home / Self Care | Payer: Medicare Other | Attending: Vascular Surgery | Admitting: Vascular Surgery

## 2021-10-27 ENCOUNTER — Encounter
Admission: RE | Disposition: A | Discharge status: Home / Self Care | Payer: Self-pay | Source: Home / Self Care | Attending: Vascular Surgery

## 2021-10-27 DIAGNOSIS — F17210 Nicotine dependence, cigarettes, uncomplicated: Secondary | ICD-10-CM | POA: Diagnosis not present

## 2021-10-27 DIAGNOSIS — I70249 Atherosclerosis of native arteries of left leg with ulceration of unspecified site: Secondary | ICD-10-CM | POA: Insufficient documentation

## 2021-10-27 DIAGNOSIS — I743 Embolism and thrombosis of arteries of the lower extremities: Secondary | ICD-10-CM

## 2021-10-27 DIAGNOSIS — I7092 Chronic total occlusion of artery of the extremities: Secondary | ICD-10-CM | POA: Diagnosis not present

## 2021-10-27 DIAGNOSIS — Z7982 Long term (current) use of aspirin: Secondary | ICD-10-CM | POA: Diagnosis not present

## 2021-10-27 DIAGNOSIS — L97929 Non-pressure chronic ulcer of unspecified part of left lower leg with unspecified severity: Secondary | ICD-10-CM | POA: Diagnosis not present

## 2021-10-27 DIAGNOSIS — I10 Essential (primary) hypertension: Secondary | ICD-10-CM | POA: Diagnosis not present

## 2021-10-27 DIAGNOSIS — I70239 Atherosclerosis of native arteries of right leg with ulceration of unspecified site: Secondary | ICD-10-CM | POA: Insufficient documentation

## 2021-10-27 DIAGNOSIS — Z79899 Other long term (current) drug therapy: Secondary | ICD-10-CM | POA: Insufficient documentation

## 2021-10-27 DIAGNOSIS — I70221 Atherosclerosis of native arteries of extremities with rest pain, right leg: Principal | ICD-10-CM | POA: Insufficient documentation

## 2021-10-27 DIAGNOSIS — L97919 Non-pressure chronic ulcer of unspecified part of right lower leg with unspecified severity: Secondary | ICD-10-CM | POA: Diagnosis not present

## 2021-10-27 DIAGNOSIS — I998 Other disorder of circulatory system: Secondary | ICD-10-CM | POA: Diagnosis present

## 2021-10-27 HISTORY — PX: LOWER EXTREMITY INTERVENTION: CATH118252

## 2021-10-27 HISTORY — PX: LOWER EXTREMITY ANGIOGRAPHY: CATH118251

## 2021-10-27 LAB — GLUCOSE, CAPILLARY
Glucose-Capillary: 98 mg/dL (ref 70–99)
Glucose-Capillary: 167 mg/dL — ABNORMAL HIGH (ref 70–99)

## 2021-10-27 LAB — MRSA NEXT GEN BY PCR, NASAL: MRSA by PCR Next Gen: NOT DETECTED

## 2021-10-27 LAB — CBC
HCT: 36.9 % (ref 36.0–46.0)
Hemoglobin: 11.8 g/dL — ABNORMAL LOW (ref 12.0–15.0)
MCH: 33 pg (ref 26.0–34.0)
MCHC: 32 g/dL (ref 30.0–36.0)
MCV: 103.1 fL — ABNORMAL HIGH (ref 80.0–100.0)
Platelets: 301 10*3/uL (ref 150–400)
RBC: 3.58 MIL/uL — ABNORMAL LOW (ref 3.87–5.11)
RDW: 13.4 % (ref 11.5–15.5)
WBC: 11.2 10*3/uL — ABNORMAL HIGH (ref 4.0–10.5)
nRBC: 0 % (ref 0.0–0.2)

## 2021-10-27 LAB — COMPREHENSIVE METABOLIC PANEL
ALT: 19 U/L (ref 0–44)
AST: 21 U/L (ref 15–41)
Albumin: 3.4 g/dL — ABNORMAL LOW (ref 3.5–5.0)
Alkaline Phosphatase: 92 U/L (ref 38–126)
Anion gap: 7 (ref 5–15)
BUN: 10 mg/dL (ref 8–23)
CO2: 26 mmol/L (ref 22–32)
Calcium: 8.9 mg/dL (ref 8.9–10.3)
Chloride: 108 mmol/L (ref 98–111)
Creatinine, Ser: 0.49 mg/dL (ref 0.44–1.00)
GFR, Estimated: >60 mL/min (ref >60)
Glucose, Bld: 107 mg/dL — ABNORMAL HIGH (ref 70–99)
Potassium: 4 mmol/L (ref 3.5–5.1)
Sodium: 141 mmol/L (ref 135–145)
Total Bilirubin: 0.5 mg/dL (ref 0.3–1.2)
Total Protein: 7.6 g/dL (ref 6.5–8.1)

## 2021-10-27 LAB — PROTIME-INR
INR: 1.2 (ref 0.8–1.2)
Prothrombin Time: 15.1 seconds (ref 11.4–15.2)

## 2021-10-27 LAB — CREATININE, SERUM
Creatinine, Ser: 0.37 mg/dL — ABNORMAL LOW (ref 0.44–1.00)
GFR, Estimated: >60 mL/min (ref >60)

## 2021-10-27 LAB — BUN: BUN: 14 mg/dL (ref 8–23)

## 2021-10-27 SURGERY — LOWER EXTREMITY ANGIOGRAPHY
Anesthesia: Moderate Sedation | Laterality: Right

## 2021-10-27 SURGERY — LOWER EXTREMITY INTERVENTION
Anesthesia: Moderate Sedation | Laterality: Right

## 2021-10-27 MED ORDER — AMLODIPINE BESYLATE 5 MG PO TABS
10.0000 mg | ORAL_TABLET | Freq: Every day | ORAL | Status: DC
  Administered 2021-10-28: 10 mg via ORAL
  Filled 2021-10-27: qty 2

## 2021-10-27 MED ORDER — CEFAZOLIN SODIUM-DEXTROSE 1-4 GM/50ML-% IV SOLN
INTRAVENOUS | Status: AC
  Filled 2021-10-27: qty 50

## 2021-10-27 MED ORDER — HEPARIN (PORCINE) 25000 UT/250ML-% IV SOLN: 600.0000 [IU]/h | INTRAVENOUS | Status: DC

## 2021-10-27 MED ORDER — HYDRALAZINE HCL 20 MG/ML IJ SOLN: 5.0000 mg | INTRAMUSCULAR | Status: DC | PRN

## 2021-10-27 MED ORDER — ONDANSETRON HCL 4 MG/2ML IJ SOLN: 4.0000 mg | Freq: Four times a day (QID) | INTRAMUSCULAR | Status: DC | PRN

## 2021-10-27 MED ORDER — DIPHENHYDRAMINE HCL 50 MG/ML IJ SOLN: 50.0000 mg | Freq: Once | INTRAMUSCULAR | Status: DC | PRN

## 2021-10-27 MED ORDER — ALTEPLASE 1 MG/ML SYRINGE FOR VASCULAR PROCEDURE
INTRAMUSCULAR | Status: DC | PRN
  Administered 2021-10-27: 10 mg via INTRA_ARTERIAL

## 2021-10-27 MED ORDER — FAMOTIDINE 20 MG PO TABS: 40.0000 mg | ORAL_TABLET | Freq: Once | ORAL | Status: DC | PRN

## 2021-10-27 MED ORDER — HYDROCODONE-ACETAMINOPHEN 5-325 MG PO TABS
1.0000 | ORAL_TABLET | Freq: Four times a day (QID) | ORAL | Status: DC | PRN
  Administered 2021-10-27: 1 via ORAL
  Filled 2021-10-27: qty 1

## 2021-10-27 MED ORDER — MIDAZOLAM HCL 2 MG/ML PO SYRP: 8.0000 mg | ORAL_SOLUTION | Freq: Once | ORAL | Status: DC | PRN

## 2021-10-27 MED ORDER — NITROGLYCERIN 1 MG/10 ML FOR IR/CATH LAB
INTRA_ARTERIAL | Status: DC | PRN
  Administered 2021-10-27: 200 ug via INTRA_ARTERIAL
  Administered 2021-10-27: 300 ug via INTRA_ARTERIAL

## 2021-10-27 MED ORDER — CEFAZOLIN SODIUM-DEXTROSE 2-4 GM/100ML-% IV SOLN
INTRAVENOUS | Status: AC
  Filled 2021-10-27: qty 100

## 2021-10-27 MED ORDER — SODIUM CHLORIDE 0.9 % IV SOLN: 250.0000 mL | INTRAVENOUS | Status: DC | PRN

## 2021-10-27 MED ORDER — HYDROMORPHONE HCL 1 MG/ML IJ SOLN
INTRAMUSCULAR | Status: AC
  Administered 2021-10-27: 0.5 mg via INTRAVENOUS
  Filled 2021-10-27: qty 0.5

## 2021-10-27 MED ORDER — SODIUM CHLORIDE 0.9% FLUSH: 3.0000 mL | INTRAVENOUS | Status: DC | PRN

## 2021-10-27 MED ORDER — MIDAZOLAM HCL 2 MG/2ML IJ SOLN
INTRAMUSCULAR | Status: AC
  Filled 2021-10-27: qty 2

## 2021-10-27 MED ORDER — CEFAZOLIN SODIUM-DEXTROSE 1-4 GM/50ML-% IV SOLN
INTRAVENOUS | Status: DC | PRN
  Administered 2021-10-27: 2 g via INTRAVENOUS

## 2021-10-27 MED ORDER — FENTANYL CITRATE (PF) 100 MCG/2ML IJ SOLN
INTRAMUSCULAR | Status: DC | PRN
  Administered 2021-10-27 (×2): 50 ug via INTRAVENOUS

## 2021-10-27 MED ORDER — ALTEPLASE 2 MG IJ SOLR
INTRAMUSCULAR | Status: AC
  Filled 2021-10-27: qty 10

## 2021-10-27 MED ORDER — MIDAZOLAM HCL 2 MG/2ML IJ SOLN
INTRAMUSCULAR | Status: DC | PRN
  Administered 2021-10-27 (×3): 1 mg via INTRAVENOUS

## 2021-10-27 MED ORDER — HEPARIN (PORCINE) 25000 UT/250ML-% IV SOLN
INTRAVENOUS | Status: AC
  Administered 2021-10-27: 600 [IU]/h via INTRAVENOUS
  Filled 2021-10-27: qty 250

## 2021-10-27 MED ORDER — IODIXANOL 320 MG/ML IV SOLN
INTRAVENOUS | Status: DC | PRN
  Administered 2021-10-27: 25 mL via INTRA_ARTERIAL

## 2021-10-27 MED ORDER — TIROFIBAN HCL IN NACL 5-0.9 MG/100ML-% IV SOLN
0.1500 ug/kg/min | INTRAVENOUS | Status: DC
  Filled 2021-10-27 (×2): qty 100

## 2021-10-27 MED ORDER — VITAMIN D 25 MCG (1000 UNIT) PO TABS
1000.0000 [IU] | ORAL_TABLET | Freq: Every day | ORAL | Status: DC
  Administered 2021-10-28: 1000 [IU] via ORAL
  Filled 2021-10-27: qty 1

## 2021-10-27 MED ORDER — FENTANYL CITRATE (PF) 100 MCG/2ML IJ SOLN
INTRAMUSCULAR | Status: AC
  Filled 2021-10-27: qty 2

## 2021-10-27 MED ORDER — HEPARIN SODIUM (PORCINE) 1000 UNIT/ML IJ SOLN
INTRAMUSCULAR | Status: AC
  Filled 2021-10-27: qty 10

## 2021-10-27 MED ORDER — HEPARIN SODIUM (PORCINE) 1000 UNIT/ML IJ SOLN
INTRAMUSCULAR | Status: DC | PRN
  Administered 2021-10-27: 3000 [IU] via INTRAVENOUS

## 2021-10-27 MED ORDER — HYDROMORPHONE HCL 1 MG/ML IJ SOLN
INTRAMUSCULAR | Status: AC
  Filled 2021-10-27: qty 0.5

## 2021-10-27 MED ORDER — HYDROCODONE-ACETAMINOPHEN 5-325 MG PO TABS
ORAL_TABLET | ORAL | Status: AC
  Filled 2021-10-27: qty 2

## 2021-10-27 MED ORDER — POTASSIUM CHLORIDE CRYS ER 20 MEQ PO TBCR: 20.0000 meq | EXTENDED_RELEASE_TABLET | Freq: Once | ORAL | Status: DC

## 2021-10-27 MED ORDER — SODIUM CHLORIDE 0.9 % IV SOLN: INTRAVENOUS | Status: DC

## 2021-10-27 MED ORDER — ALUM & MAG HYDROXIDE-SIMETH 200-200-20 MG/5ML PO SUSP: 15.0000 mL | ORAL | Status: DC | PRN

## 2021-10-27 MED ORDER — CEFAZOLIN SODIUM-DEXTROSE 1-4 GM/50ML-% IV SOLN
INTRAVENOUS | Status: DC | PRN
  Administered 2021-10-27: 1 g via INTRAVENOUS

## 2021-10-27 MED ORDER — METOPROLOL TARTRATE 25 MG PO TABS
12.5000 mg | ORAL_TABLET | Freq: Two times a day (BID) | ORAL | Status: DC
  Administered 2021-10-27 – 2021-10-28 (×2): 12.5 mg via ORAL
  Filled 2021-10-27 (×2): qty 1

## 2021-10-27 MED ORDER — TIROFIBAN (AGGRASTAT) BOLUS VIA INFUSION
25.0000 ug/kg | Freq: Once | INTRAVENOUS | Status: AC
  Administered 2021-10-27: 1157.5 ug via INTRAVENOUS
  Filled 2021-10-27: qty 24

## 2021-10-27 MED ORDER — CEFAZOLIN SODIUM-DEXTROSE 2-4 GM/100ML-% IV SOLN: 2.0000 g | INTRAVENOUS | Status: DC

## 2021-10-27 MED ORDER — ASPIRIN 81 MG PO TBEC
81.0000 mg | DELAYED_RELEASE_TABLET | Freq: Every day | ORAL | Status: DC
  Administered 2021-10-27 – 2021-10-28 (×2): 81 mg via ORAL
  Filled 2021-10-27 (×2): qty 1

## 2021-10-27 MED ORDER — METHYLPREDNISOLONE SODIUM SUCC 125 MG IJ SOLR: 125.0000 mg | Freq: Once | INTRAMUSCULAR | Status: DC | PRN

## 2021-10-27 MED ORDER — METOPROLOL TARTRATE 5 MG/5ML IV SOLN: 2.0000 mg | INTRAVENOUS | Status: DC | PRN

## 2021-10-27 MED ORDER — HYDROMORPHONE HCL 1 MG/ML IJ SOLN
1.0000 mg | INTRAMUSCULAR | Status: DC | PRN
  Administered 2021-10-27: 0.5 mg via INTRAVENOUS
  Administered 2021-10-28: 1 mg via INTRAVENOUS
  Filled 2021-10-27: qty 1

## 2021-10-27 MED ORDER — IODIXANOL 320 MG/ML IV SOLN
INTRAVENOUS | Status: DC | PRN
  Administered 2021-10-27: 65 mL via INTRA_ARTERIAL

## 2021-10-27 MED ORDER — PHENOL 1.4 % MT LIQD: 1.0000 | OROMUCOSAL | Status: DC | PRN

## 2021-10-27 MED ORDER — LABETALOL HCL 5 MG/ML IV SOLN: 10.0000 mg | INTRAVENOUS | Status: DC | PRN

## 2021-10-27 MED ORDER — SODIUM CHLORIDE 0.9% FLUSH
3.0000 mL | Freq: Two times a day (BID) | INTRAVENOUS | Status: DC
  Administered 2021-10-27 – 2021-10-28 (×2): 3 mL via INTRAVENOUS

## 2021-10-27 MED ORDER — PANTOPRAZOLE SODIUM 40 MG PO TBEC
40.0000 mg | DELAYED_RELEASE_TABLET | Freq: Every day | ORAL | Status: DC
  Administered 2021-10-27 – 2021-10-28 (×2): 40 mg via ORAL
  Filled 2021-10-27 (×2): qty 1

## 2021-10-27 MED ORDER — FENTANYL CITRATE PF 50 MCG/ML IJ SOSY
PREFILLED_SYRINGE | INTRAMUSCULAR | Status: AC
  Filled 2021-10-27: qty 1

## 2021-10-27 MED ORDER — SODIUM CHLORIDE 0.9 % IV SOLN
1.0000 mg/h | INTRAVENOUS | Status: DC
  Administered 2021-10-27: 1 mg/h
  Filled 2021-10-27 (×3): qty 10

## 2021-10-27 MED ORDER — FENTANYL CITRATE (PF) 100 MCG/2ML IJ SOLN
INTRAMUSCULAR | Status: DC | PRN
Start: 2021-10-27 — End: 2021-10-27
  Administered 2021-10-27 (×3): 50 ug via INTRAVENOUS

## 2021-10-27 MED ORDER — APIXABAN 5 MG PO TABS
5.0000 mg | ORAL_TABLET | Freq: Two times a day (BID) | ORAL | Status: DC
Start: 2021-10-28 — End: 2021-10-28
  Administered 2021-10-28: 5 mg via ORAL
  Filled 2021-10-27: qty 1

## 2021-10-27 MED ORDER — TIROFIBAN HCL IV 12.5 MG/250 ML
INTRAVENOUS | Status: AC
  Administered 2021-10-27: 0.15 ug/kg/min via INTRAVENOUS
  Filled 2021-10-27: qty 250

## 2021-10-27 MED ORDER — HYDROCODONE-ACETAMINOPHEN 5-325 MG PO TABS
1.0000 | ORAL_TABLET | Freq: Four times a day (QID) | ORAL | Status: DC | PRN
  Administered 2021-10-27: 2 via ORAL

## 2021-10-27 MED ORDER — MIDAZOLAM HCL 2 MG/2ML IJ SOLN
INTRAMUSCULAR | Status: DC | PRN
  Administered 2021-10-27 (×2): 1 mg via INTRAVENOUS

## 2021-10-27 MED ORDER — ATORVASTATIN CALCIUM 10 MG PO TABS
10.0000 mg | ORAL_TABLET | Freq: Every day | ORAL | Status: DC
  Administered 2021-10-28: 10 mg via ORAL
  Filled 2021-10-27: qty 1

## 2021-10-27 MED ORDER — HYDROMORPHONE HCL 1 MG/ML IJ SOLN: 1.0000 mg | Freq: Once | INTRAMUSCULAR | Status: DC | PRN

## 2021-10-27 MED ORDER — GUAIFENESIN-DM 100-10 MG/5ML PO SYRP: 15.0000 mL | ORAL_SOLUTION | ORAL | Status: DC | PRN

## 2021-10-27 SURGICAL SUPPLY — 18 items
BALLN ULTRVRSE 2.5X300X150 (BALLOONS) ×2
BALLN ULTRVRSE 3X100X150 (BALLOONS) ×2
BALLN ULTRVRSE 4X150X150 (BALLOONS) ×2
BALLOON ULTRVRSE 2.5X300X150 (BALLOONS) IMPLANT
BALLOON ULTRVRSE 3X100X150 (BALLOONS) IMPLANT
BALLOON ULTRVRSE 4X150X150 (BALLOONS) IMPLANT
CANISTER PENUMBRA ENGINE (MISCELLANEOUS) ×1 IMPLANT
CATH LIGHTNING 7 XTORQ 130 (CATHETERS) ×1 IMPLANT
CATH NAVICROSS ANGLED 135CM (MICROCATHETER) ×1 IMPLANT
DEVICE SAFEGUARD 24CM (GAUZE/BANDAGES/DRESSINGS) ×1 IMPLANT
DEVICE STARCLOSE SE CLOSURE (Vascular Products) ×1 IMPLANT
GLIDEWIRE ADV .035X260CM (WIRE) ×1 IMPLANT
GUIDEWIRE PFTE-COATED .018X300 (WIRE) ×1 IMPLANT
KIT ENCORE 26 ADVANTAGE (KITS) ×1 IMPLANT
PACK ANGIOGRAPHY (CUSTOM PROCEDURE TRAY) ×1 IMPLANT
SHEATH FLEXOR ANSEL2 7FRX45 (SHEATH) ×1 IMPLANT
STENT VIABAHN 5X100X120 (Permanent Stent) ×1 IMPLANT
WIRE G V18X300CM (WIRE) ×1 IMPLANT

## 2021-10-27 SURGICAL SUPPLY — 12 items
BIOPATCH RED 1 DISK 7.0 (GAUZE/BANDAGES/DRESSINGS) ×1 IMPLANT
CATH ANGIO 5F PIGTAIL 65CM (CATHETERS) ×1 IMPLANT
CATH BEACON 5 .035 65 KMP TIP (CATHETERS) ×1 IMPLANT
CATH INFUS 135CMX50CM (CATHETERS) ×1 IMPLANT
COVER PROBE U/S 5X48 (MISCELLANEOUS) ×1 IMPLANT
GLIDEWIRE ADV .035X260CM (WIRE) ×1 IMPLANT
PACK ANGIOGRAPHY (CUSTOM PROCEDURE TRAY) ×2 IMPLANT
SHEATH BRITE TIP 5FRX11 (SHEATH) ×1 IMPLANT
SUT PROLENE 0 CT 1 30 (SUTURE) ×1 IMPLANT
SYR MEDRAD MARK 7 150ML (SYRINGE) ×1 IMPLANT
TUBING CONTRAST HIGH PRESS 72 (TUBING) ×1 IMPLANT
WIRE GUIDERIGHT .035X150 (WIRE) ×1 IMPLANT

## 2021-10-27 NOTE — Interval H&P Note (Signed)
History and Physical Interval Note:  10/27/2021 8:08 AM  Yvonne [name]  has presented today for surgery, with the diagnosis of Atherosclerosis obliterans with ulceration.  The various methods of treatment have been discussed with the patient and family. After consideration of risks, benefits and other options for treatment, the patient has consented to  Procedure(s): Lower Extremity Angiography (Right) as a surgical intervention.  The patient's history has been reviewed, patient examined, no change in status, stable for surgery.  I have reviewed the patient's chart and labs.  Questions were answered to the patient's satisfaction.     [name]

## 2021-10-27 NOTE — Progress Notes (Signed)
Pt. Med. With 2nd Dilaudid 0.5 mg IV for c/o persistent pain :"6" on scale 1-10 post lysis catheter placement.

## 2021-10-27 NOTE — Op Note (Signed)
[facility]  Percutaneous Study/Intervention Procedural Note   Date of Surgery: 10/27/2021  Surgeon(s):Nupur Hohman    Assistants:none  Pre-operative Diagnosis: PAD with rest pain RLE  Post-operative diagnosis:  Same  Procedure(s) Performed:             1.  Right lower extremity angiogram             2.  Mechanical thrombectomy of the right SFA, popliteal artery, tibioperoneal trunk, and proximal peroneal artery             3.  Percutaneous transluminal angioplasty of the right peroneal artery with 2.5 mm diameter angioplasty balloon and of the tibioperoneal trunk with 2.5 and 3 mm diameter angioplasty balloons             4.  Percutaneous transluminal angioplasty of right popliteal artery with 4 mm diameter by 15 cm length Lutonix drug-coated angioplasty balloon             5.  Viabahn stent placement to the right popliteal artery with 5 mm diameter by 10 cm length stent  6.  StarClose closure device left femoral artery  EBL: 200 cc  Contrast: 65 cc  Fluoro Time: 11.9 minutes  Moderate Conscious Sedation Time: approximately 62 minutes using 3 mg of Versed and 150 mcg of Fentanyl              Indications:  Patient is a 66 y.o.female with an ischemic right leg who has been running thrombolytic therapy throughout the day today and is brought back for second look angiography. The patient is brought in for angiography for further evaluation and potential treatment.  Due to the limb threatening nature of the situation, angiogram was performed for attempted limb salvage. The patient is aware that if the procedure fails, amputation would be expected.  The patient also understands that even with successful revascularization, amputation may still be required due to the severity of the situation.  Risks and benefits are discussed and informed consent is obtained.   Procedure:  The patient was identified and appropriate procedural time out was performed.  The patient was then  placed supine on the table and prepped and draped in the usual sterile fashion. Moderate conscious sedation was administered during a face to face encounter with the patient throughout the procedure with my supervision of the RN administering medicines and monitoring the patient's vital signs, pulse oximetry, telemetry and mental status throughout from the start of the procedure until the patient was taken to the recovery room.  An advantage wire was placed through the existing lysis catheter and a 7 French Ansell sheath was placed over the advantage wire and the lysis catheter removed.  Selective right lower extremity angiogram was then performed. This demonstrated thrombus in the distal portion of the stent and in the popliteal artery below the stent with occlusion of the proximal peroneal artery and tibioperoneal trunk with the mid to distal peroneal artery being the best target for runoff distally.  The posterior tibial artery was chronically occluded without distal reconstitution.  The anterior tibial artery had flow proximally, but occluded in the mid to distal segment and was not continuous to the foot. It was felt that it was in the patient's best interest to proceed with intervention after these images to avoid a second procedure and a larger amount of contrast and fluoroscopy based off of the findings from the initial angiogram. The Ferd Hibbs cross catheter was placed and we exchanged for a  V18 wire and then performed mechanical thrombectomy with the penumbra CAT 7 lightning catheter in the right SFA, popliteal artery, tibioperoneal trunk, and the proximal portion of the peroneal artery.  Using the 0.018 advantage wire was able to cross the occlusion in the peroneal artery and get down distally.  Attempts recanalizing the posterior tibial and anterior tibial arteries were not fruitful as there was not a good distal target.  Once we had the peroneal artery access, I then performed angioplasty with a 2.5 mm  diameter by 30 cm length angioplasty balloon from the mid to distal peroneal artery up to the tibioperoneal trunk completing this to 8 atm for 1 minute.  There remained thrombus and stenosis in the popliteal artery just below the previously placed stent and I treated this with a 4 mm diameter by 15 cm length tonics drug-coated angioplasty balloon inflated to 10 atm for 1 minute.  Following this, there is no thrombus in the tibioperoneal trunk and we took the penumbra CAT 7 catheter back down in the tibioperoneal trunk and proximal peroneal artery which resulted in removal of the thrombus but there was significant residual stenosis in the tibioperoneal trunk and the most proximal portion of the peroneal artery.  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  I then used a 3 mm diameter by 10 cm length angioplasty balloon in the tibioperoneal trunk and the most proximal peroneal artery inflating this to 8 atm for 1 minute.  Completion imaging following this showed marked improvement with only about a 30 to 35% residual stenosis in the tibioperoneal trunk and proximal peroneal artery.  I then stented the popliteal artery bridging up into the previously placed stents a few centimeters and going to just above geniculate collaterals with a 5 mm diameter by 10 cm length Viabahn stent that does not postdilated as do not create distal embolization.  Completion imaging showed marked improvement with less than 10% residual stenosis after stent placement.  I elected to terminate the procedure. The sheath was removed and StarClose closure device was deployed in the Femoral artery with excellent hemostatic result. The patient was taken to the recovery room in stable condition having tolerated the procedure well.  Findings:                           Right Lower Extremity:  This demonstrated thrombus in the distal portion of the stent and in the  popliteal artery below the stent with occlusion of the proximal peroneal artery and tibioperoneal trunk with the mid to distal peroneal artery being the best target for runoff distally.  The posterior tibial artery was chronically occluded without distal reconstitution.  The anterior tibial artery had flow proximally, but occluded in the mid to distal segment and was not continuous to the foot.    Disposition: Patient was taken to the recovery room in stable condition having tolerated the procedure well.  Complications: None  [name] 10/27/2021 3:25 PM   This note was created with Dragon Medical transcription system. Any errors in dictation are purely unintentional.

## 2021-10-27 NOTE — Plan of Care (Signed)

## 2021-10-27 NOTE — Progress Notes (Signed)
Med. With Norco 2 tabs now secondary to persistent pain to right foot : "9" on scale 1-10. Lysis catheter intact without any complications to left groin.

## 2021-10-27 NOTE — Op Note (Signed)
[facility]  Percutaneous Study/Intervention Procedural Note   Date of Surgery: 10/27/2021  Surgeon(s):Rida Loudin    Assistants:none  Pre-operative Diagnosis: PAD with rest pain, acute on chronic right lower extremity ischemia  Post-operative diagnosis:  Same  Procedure(s) Performed:             1.  Ultrasound guidance for vascular access left femoral artery             2.  Catheter placement into right popliteal artery from left femoral approach             3.  Aortogram and selective right lower extremity angiogram             4.  Placement of a 135 cm total length 50 cm working length thrombolytic catheter with instillation of 10 mg of tPA throughout the right SFA and popliteal arteries  EBL: 5 cc  Contrast: 25 cc  Fluoro Time: 2.8 minutes  Moderate Conscious Sedation Time: approximately 24 minutes using 2 mg of Versed and 100 mcg of Fentanyl              Indications:  Patient is a 66 y.o.female with acute on chronic ischemia of the right lower extremity with rest pain. The patient has noninvasive study showing occlusion of her previous intervention with very poor flow. The patient is brought in for angiography for further evaluation and potential treatment.  Due to the limb threatening nature of the situation, angiogram was performed for attempted limb salvage. The patient is aware that if the procedure fails, amputation would be expected.  The patient also understands that even with successful revascularization, amputation may still be required due to the severity of the situation. Risks and benefits are discussed and informed consent is obtained.   Procedure:  The patient was identified and appropriate procedural time out was performed.  The patient was then placed supine on the table and prepped and draped in the usual sterile fashion. Moderate conscious sedation was administered during a face to face encounter with the patient throughout the procedure with  my supervision of the RN administering medicines and monitoring the patient's vital signs, pulse oximetry, telemetry and mental status throughout from the start of the procedure until the patient was taken to the recovery room. Ultrasound was used to evaluate the left common femoral artery.  It was patent .  A digital ultrasound image was acquired.  A Seldinger needle was used to access the left common femoral artery under direct ultrasound guidance and a permanent image was performed.  A 0.035 J wire was advanced without resistance and a 5Fr sheath was placed.  Pigtail catheter was placed into the aorta and an AP aortogram was performed. This demonstrated significant right renal artery stenosis and some degree of left renal artery stenosis.  The aorta was small and calcific but not stenotic.  The left common iliac artery had mild disease that did not appear flow-limiting.  There was calcification and shadowing in the right common iliac artery although the degree of stenosis was not well seen initially.  The right and left external iliac arteries were widely patent. I then crossed the aortic bifurcation and advanced to the right femoral head. Selective right lower extremity angiogram was then performed. This demonstrated Common femoral profunda femoris artery had flow but there was a flush occlusion of the right SFA with occlusion of all the previously placed stents.  There was reconstitution of the mid popliteal artery below the previously  placed stents.  The anterior tibial artery appeared to have significant proximal stenosis best seen after across the occlusion with the Kumpe catheter and this appeared to be greater than 75%.  The peroneal artery was occluded in the proximal and mid segment but did reconstitute in the midsegment and had flow distally.  The posterior tibial artery was chronically occluded. It was felt that it was in the patient's best interest to proceed with intervention, but with a large amount  of fresh thrombus I felt our best bet for revascularization would be to place a lysis catheter and let this run tPA and bring her back for a second look angiography with hopes of revascularization.  The lysis catheter was taken over the advantage wire and parked in the mid popliteal artery and intraluminal flow was confirmed and this also demonstrated the tibial disease as described above better.  The proximal portion was in the right external iliac artery.  This was a 135 cm total length 50 cm working length catheter.  I then instilled 10 mg of tPA through the catheter and secured into place with Prolene sutures.  The patient was taken to the recovery room in stable condition having tolerated the procedure well.  Findings:               Aortogram:  This demonstrated significant right renal artery stenosis and some degree of left renal artery stenosis.  The aorta was small and calcific but not stenotic.  The left common iliac artery had mild disease that did not appear flow-limiting.  There was calcification and shadowing in the right common iliac artery although the degree of stenosis was not well seen initially.  The right and left external iliac arteries were widely patent.             Right lower Extremity: Common femoral profunda femoris artery had flow but there was a flush occlusion of the right SFA with occlusion of all the previously placed stents.  There was reconstitution of the mid popliteal artery below the previously placed stents.  The anterior tibial artery appeared to have significant proximal stenosis best seen after across the occlusion with the Kumpe catheter and this appeared to be greater than 75%.  The peroneal artery was occluded in the proximal and mid segment but did reconstitute in the midsegment and had flow distally.  The posterior tibial artery was chronically occluded.   Disposition: Patient was taken to the recovery room in stable condition having tolerated the procedure  well.  Complications: None  [name] 10/27/2021 8:46 AM   This note was created with Dragon Medical transcription system. Any errors in dictation are purely unintentional.

## 2021-10-27 NOTE — Progress Notes (Signed)
Labs drawn and sent to lab via runner. Aggrastat gtt started per MD orders. Dr. Wyn Quaker at bedside, speaking to pt. Re: procedural results. Pt. Verbalized understanding of conversation. Left groin clean, dry, intact without complications at site. PAD intact to Left groin.

## 2021-10-28 DIAGNOSIS — I70221 Atherosclerosis of native arteries of extremities with rest pain, right leg: Secondary | ICD-10-CM | POA: Diagnosis not present

## 2021-10-28 LAB — CBC
HCT: 29.9 % — ABNORMAL LOW (ref 36.0–46.0)
Hemoglobin: 9.7 g/dL — ABNORMAL LOW (ref 12.0–15.0)
MCH: 32.8 pg (ref 26.0–34.0)
MCHC: 32.4 g/dL (ref 30.0–36.0)
MCV: 101 fL — ABNORMAL HIGH (ref 80.0–100.0)
Platelets: 264 10*3/uL (ref 150–400)
RBC: 2.96 MIL/uL — ABNORMAL LOW (ref 3.87–5.11)
RDW: 13.3 % (ref 11.5–15.5)
WBC: 9.7 10*3/uL (ref 4.0–10.5)
nRBC: 0 % (ref 0.0–0.2)

## 2021-10-28 MED ORDER — CHLORHEXIDINE GLUCONATE CLOTH 2 % EX PADS
6.0000 | MEDICATED_PAD | Freq: Every day | CUTANEOUS | Status: DC
  Administered 2021-10-28: 6 via TOPICAL

## 2021-10-28 MED ORDER — APIXABAN 5 MG PO TABS: 5.0000 mg | ORAL_TABLET | Freq: Two times a day (BID) | ORAL | 0 refills | Status: DC

## 2021-10-28 NOTE — Progress Notes (Signed)
      Daily Progress Note   Assessment/Planning:   POD #1 s/p Suction TE R SFA, pop, TPT, proximal peroneal; PTA R peroneal and TPT, DCB PTA R pop, PTA+S R pop w/ Viabahn POD #2 s/p Placement of R leg thrombolysis catheter  No bleeding from access R leg well revascular Ambulate  Dr. Lucky Cowboy wants this pt discharged on Eliquis.  Will give a dose here   Subjective  - 1 Day Post-Op   R foot feels much better   Objective   Vitals:   10/28/21 0700 10/28/21 0752 10/28/21 0800 10/28/21 0803  BP: 136/64  (!) 127/102 (!) 127/102  Pulse: 82  86 94  Resp: (!) 22  16 (!) 22  Temp:  99.8 F (37.7 C)    TempSrc:  Oral    SpO2: 96%  96% 95%  Weight:      Height:         Intake/Output Summary (Last 24 hours) at 10/28/2021 0837 Last data filed at 10/28/2021 0500 Gross per 24 hour  Intake 950.42 ml  Output 750 ml  Net 200.42 ml    PULM  CTAB  CV  RRR  GI  soft, NTND  VASC Warm R foot, faint PT pulse  NEURO Motor and sensation intact in both feet, A&O x 3    Laboratory   CBC    Latest Ref Rng & Units 10/28/2021    4:29 AM 10/27/2021    3:41 PM 03/21/2021    4:24 AM  CBC  WBC 4.0 - 10.5 K/uL 9.7   11.2   12.4    Hemoglobin 12.0 - 15.0 g/dL 9.7   11.8   12.9    Hematocrit 36.0 - 46.0 % 29.9   36.9   38.7    Platelets 150 - 400 K/uL 264   301   189      BMET    Component Value Date/Time   NA 141 10/27/2021 1541   K 4.0 10/27/2021 1541   CL 108 10/27/2021 1541   CO2 26 10/27/2021 1541   GLUCOSE 107 (H) 10/27/2021 1541   BUN 10 10/27/2021 1541   CREATININE 0.49 10/27/2021 1541   CALCIUM 8.9 10/27/2021 1541   GFRNONAA >60 10/27/2021 1541   GFRAA >60 12/02/2019 1055     Adele Barthel, MD, FACS, FSVS Covering for [facility]: [phone]  10/28/2021, 8:37 AM

## 2021-10-28 NOTE — Discharge Summary (Signed)
Physician Discharge Summary  Patient ID: Yvonne [name] MRN: [id number] DOB/AGE: 1955-08-16 66 y.o.  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  Follow study was completed on 10/27/21.  Using a combination of suction thrombectomy, angioplasty, drug coated angioplasty and stenting, the right leg vasculature was recannulated.  The patient tolerated the procedure well and was stable for discharge the next day, 10/28/21.  Patient was started on Eliquis per Dr. Driscilla Grammes instructions.  She needs to call his office for follow appointment on Monday  Consults: None  Significant Diagnostic Studies: see angiography reports  Treatments: see Op Notes  Discharge Exam: Blood pressure (!) 127/102, pulse 94, temperature 99.8 F (37.7 C), temperature source Oral, resp. rate (!) 22, height 5\' 3"  (1.6 m), weight 48 kg, SpO2 95 %. See progress note on 10/28/21  Disposition:  There are no questions and answers to display.      Discharge Medications: documented separated as smart tool not functioning      Follow-up Information     Dew, 12/28/21, MD Follow up.   Specialties: Vascular Surgery, Radiology, Interventional Cardiology Why: Call on Monday for appointment in 2 weeks Contact information: [phone] [place] [address]                 Signed: [phone] 10/28/2021, 8:47 AM

## 2021-10-30 ENCOUNTER — Encounter: Payer: Self-pay | Admitting: Vascular Surgery

## 2021-11-02 ENCOUNTER — Other Ambulatory Visit (INDEPENDENT_AMBULATORY_CARE_PROVIDER_SITE_OTHER): Payer: Self-pay | Admitting: Vascular Surgery

## 2021-11-02 ENCOUNTER — Encounter: Payer: Self-pay | Admitting: Vascular Surgery

## 2021-11-02 DIAGNOSIS — I739 Peripheral vascular disease, unspecified: Secondary | ICD-10-CM

## 2021-11-02 DIAGNOSIS — Z95820 Peripheral vascular angioplasty status with implants and grafts: Secondary | ICD-10-CM

## 2021-11-03 ENCOUNTER — Telehealth (INDEPENDENT_AMBULATORY_CARE_PROVIDER_SITE_OTHER): Payer: Self-pay | Admitting: Vascular Surgery

## 2021-11-03 NOTE — Telephone Encounter (Signed)
Patient was made aware with medical advice and verbalized understanding. Patient informed that she was not able to come by the office today due to transportation and [facility] NP recommended for the patient to take Clopidogrel 75mg  and aspirin 81mg . The patient will come by the office to pick up medication on Monday.

## 2021-11-03 NOTE — Telephone Encounter (Signed)
We can have the patient pick up a 30 day free card and some samples.  We can also give her an assistance form to fill out to see if the company will pay for the medication.  She will need to fill it out and return it to [place].  She really needs to make sure she takes her medication so that her arteries don't become blocked again.  It is not uncommon to have swelling because the patient is now getting blood flow to her foot and that usually goes away in a few days.   We recommend that if you are relaxing, to elevate your foot. It is also likely tingling because some nerves died when she didn't have blood flow and they are trying to wake up.  She needs to take eliquis because we she took plavix she got a clot in her leg that blocked her blood flow.  We had a very hard time restoring her blood flow so it is very important that she takes eliquis at least for a a few months.

## 2021-11-03 NOTE — Telephone Encounter (Signed)
Dr. Wyn Quaker performed surgery last Friday - RX Eliquis and it is $588 - pt states she can't afford it. Pt does not have insurance for medication. She wants to know what she can do. Pt states that her foot/ankle is still swelling since yesterday. States that it is tingling. Pt states that she was taking the generic form of Plavix. Wants to know if she can keep taking it. States that she was told to stop taking it and start the Eliquis. Pt can be reached at [phone]

## 2021-11-15 ENCOUNTER — Encounter (INDEPENDENT_AMBULATORY_CARE_PROVIDER_SITE_OTHER): Payer: Self-pay | Admitting: Nurse Practitioner

## 2021-11-15 ENCOUNTER — Ambulatory Visit (INDEPENDENT_AMBULATORY_CARE_PROVIDER_SITE_OTHER): Payer: Medicare Other | Admitting: Nurse Practitioner

## 2021-11-15 ENCOUNTER — Ambulatory Visit (INDEPENDENT_AMBULATORY_CARE_PROVIDER_SITE_OTHER): Payer: Medicare Other

## 2021-11-15 VITALS — Resp 15 | Wt 110.2 lb

## 2021-11-15 DIAGNOSIS — E782 Mixed hyperlipidemia: Secondary | ICD-10-CM

## 2021-11-15 DIAGNOSIS — I10 Essential (primary) hypertension: Secondary | ICD-10-CM

## 2021-11-15 DIAGNOSIS — I739 Peripheral vascular disease, unspecified: Secondary | ICD-10-CM | POA: Diagnosis not present

## 2021-11-15 DIAGNOSIS — Z95820 Peripheral vascular angioplasty status with implants and grafts: Secondary | ICD-10-CM | POA: Diagnosis not present

## 2021-11-28 ENCOUNTER — Encounter (INDEPENDENT_AMBULATORY_CARE_PROVIDER_SITE_OTHER): Payer: Self-pay | Admitting: Nurse Practitioner

## 2021-11-28 NOTE — Progress Notes (Signed)
Subjective:    Patient ID: Yvonne [name], female    DOB: 12-02-1955, 66 y.o.   MRN: [id number] Chief Complaint  Patient presents with   Follow-up    [facility] 2 week follow up    The patient returns to the office for followup and review status post angiogram with intervention on 10/27/2021.   Procedure: Procedure(s) Performed:             1.  Right lower extremity angiogram             2.  Mechanical thrombectomy of the right SFA, popliteal artery, tibioperoneal trunk, and proximal peroneal artery             3.  Percutaneous transluminal angioplasty of the right peroneal artery with 2.5 mm diameter angioplasty balloon and of the tibioperoneal trunk with 2.5 and 3 mm diameter angioplasty balloons             4.  Percutaneous transluminal angioplasty of right popliteal artery with 4 mm diameter by 15 cm length Lutonix drug-coated angioplasty balloon             5.  Viabahn stent placement to the right popliteal artery with 5 mm diameter by 10 cm length stent             6.  StarClose closure device left femoral artery    The patient notes improvement in the lower extremity symptoms. No interval shortening of the patient's claudication distance or rest pain symptoms. No new ulcers or wounds have occurred since the last visit.  There have been no significant changes to the patient's overall health care.  No documented history of amaurosis fugax or recent TIA symptoms. There are no recent neurological changes noted. No documented history of DVT, PE or superficial thrombophlebitis. The patient denies recent episodes of angina or shortness of breath.   ABI's Rt=0.95 and Lt=0.86  (previous ABI's Rt=0.30 and Lt=0.91) Duplex US of the right lower extremity showed monophasic waveforms with triphasic in the right.    Review of Systems  Skin:  Negative for wound.  All other systems reviewed and are negative.      Objective:   Physical Exam Vitals reviewed.  HENT:     Head: Normocephalic.   Cardiovascular:     Rate and Rhythm: Normal rate.     Pulses:          Dorsalis pedis pulses are detected w/ Doppler on the right side and 1+ on the left side.       Posterior tibial pulses are detected w/ Doppler on the right side and 1+ on the left side.  Pulmonary:     Effort: Pulmonary effort is normal.  Skin:    General: Skin is dry.  Neurological:     Mental Status: She is alert and oriented to person, place, and time.  Psychiatric:        Mood and Affect: Mood normal.        Behavior: Behavior normal.        Thought Content: Thought content normal.        Judgment: Judgment normal.     Resp 15   Wt 110 lb 3.2 oz (50 kg)   BMI 19.52 kg/m   Past Medical History:  Diagnosis Date   Hyperlipidemia    Hypertension    Peripheral vascular disease (HCC)    Syncope     Social History   Socioeconomic History   Marital status: Divorced  Spouse name: Not on file   Number of children: 2   Years of education: Not on file   Highest education level: Not on file  Occupational History   Not on file  Tobacco Use   Smoking status: Every Day    Packs/day: 1.00    Types: Cigarettes   Smokeless tobacco: Never  Substance and Sexual Activity   Alcohol use: Yes    Alcohol/week: 6.0 standard drinks of alcohol    Types: 6 Glasses of wine per week   Drug use: Not on file   Sexual activity: Not on file  Other Topics Concern   Not on file  Social History Narrative   Lives with her brother Yvonne Fus in [place]    Social Determinants of Health   Financial Resource Strain: Not on file  Food Insecurity: Not on file  Transportation Needs: Not on file  Physical Activity: Not on file  Stress: Not on file  Social Connections: Not on file  Intimate Partner Violence: Not on file    Past Surgical History:  Procedure Laterality Date   LOWER EXTREMITY ANGIOGRAPHY Right 10/02/2021   Procedure: Lower Extremity Angiography;  Surgeon: Yvonne Needy, MD;  Location: [facility] INVASIVE [facility];   Service: Cardiovascular;  Laterality: Right;   LOWER EXTREMITY ANGIOGRAPHY Left 10/09/2021   Procedure: Lower Extremity Angiography;  Surgeon: Yvonne Needy, MD;  Location: [facility];  Service: Cardiovascular;  Laterality: Left;   LOWER EXTREMITY ANGIOGRAPHY Right 10/27/2021   Procedure: Lower Extremity Angiography;  Surgeon: Yvonne Needy, MD;  Location: [facility];  Service: Cardiovascular;  Laterality: Right;   LOWER EXTREMITY INTERVENTION Right 10/27/2021   Procedure: LOWER EXTREMITY INTERVENTION;  Surgeon: Yvonne Needy, MD;  Location: [facility]VASIVE CV [facility];  Service: Cardiovascular;  Laterality: Right;   PARTIAL HYSTERECTOMY      History reviewed. No pertinent family history.  No Known Allergies     Latest Ref Rng & Units 10/28/2021    4:29 AM 10/27/2021    3:41 PM 03/21/2021    4:24 AM  CBC  WBC 4.0 - 10.5 K/uL 9.7  11.2  12.4   Hemoglobin 12.0 - 15.0 g/dL 9.7  16.1  09.6   Hematocrit 36.0 - 46.0 % 29.9  36.9  38.7   Platelets 150 - 400 K/uL 264  301  189       CMP     Component Value Date/Time   NA 141 10/27/2021 1541   K 4.0 10/27/2021 1541   CL 108 10/27/2021 1541   CO2 26 10/27/2021 1541   GLUCOSE 107 (H) 10/27/2021 1541   BUN 10 10/27/2021 1541   CREATININE 0.49 10/27/2021 1541   CALCIUM 8.9 10/27/2021 1541   PROT 7.6 10/27/2021 1541   ALBUMIN 3.4 (L) 10/27/2021 1541   AST 21 10/27/2021 1541   ALT 19 10/27/2021 1541   ALKPHOS 92 10/27/2021 1541   BILITOT 0.5 10/27/2021 1541   GFRNONAA >60 10/27/2021 1541   GFRAA >60 12/02/2019 1055     VAS [place] ABI WITH/WO TBI  Result Date: 11/22/2021  LOWER EXTREMITY DOPPLER STUDY Patient Name:  Yvonne [name]  Date of Exam:   11/15/2021 Medical Rec #: [id number]    Accession #:    [id number] Date of Birth: [birth year]   Patient Gender: F Patient Age:   49 years Exam Location:  [facility] Procedure:      VAS [place] ABI WITH/WO TBI Referring Phys: Yvonne [name]  --------------------------------------------------------------------------------  Indications: Peripheral artery disease.  Vascular Interventions: 10/27/2021 rt thrombectomy plus new poplitealstent                         10/02/21: Right SFA/popliteal stent with TP trunk/peroneal                         PTAs;                         10/09/21: Left SFA stent with popliteal/TP                         trunk//ATA/peroneal PTAs;. Comparison Study: 10/26/2021 Performing Technologist: Debbe BalesSolomon Mcclary RVS  Examination Guidelines: A complete evaluation includes at minimum, Doppler waveform signals and systolic blood pressure reading at the level of bilateral brachial, anterior tibial, and posterior tibial arteries, when vessel segments are accessible. Bilateral testing is considered an integral part of a complete examination. Photoelectric Plethysmograph (PPG) waveforms and toe systolic pressure readings are included as required and additional duplex testing as needed. Limited examinations for reoccurring indications may be performed as noted.  ABI Findings: +---------+------------------+-----+----------+--------+ Right    Rt Pressure (mmHg)IndexWaveform  Comment  +---------+------------------+-----+----------+--------+ Brachial 173                                       +---------+------------------+-----+----------+--------+ ATA      168               0.95 monophasic         +---------+------------------+-----+----------+--------+ PTA      127               0.72 monophasic         +---------+------------------+-----+----------+--------+ Great Toe102               0.58 Normal             +---------+------------------+-----+----------+--------+ +---------+------------------+-----+---------+-------+ Left     Lt Pressure (mmHg)IndexWaveform Comment +---------+------------------+-----+---------+-------+ Brachial 176                                      +---------+------------------+-----+---------+-------+ ATA      152               0.86 triphasic        +---------+------------------+-----+---------+-------+ PTA      136               0.77 biphasic         +---------+------------------+-----+---------+-------+ Great Toe157               0.89 Normal           +---------+------------------+-----+---------+-------+ +-------+-----------+-----------+------------+------------+ ABI/TBIToday's ABIToday's TBIPrevious ABIPrevious TBI +-------+-----------+-----------+------------+------------+ Right  .95        .58        .30         .14          +-------+-----------+-----------+------------+------------+ Left   .86        .89        .91         .56          +-------+-----------+-----------+------------+------------+  Right ABIs and TBIs appear increased compared to prior study on 10/26/2021.  Summary:  Right: Resting right ankle-brachial index is within normal range. No evidence of significant right lower extremity arterial disease. The right toe-brachial index is abnormal. Left: Resting left ankle-brachial index indicates mild left lower extremity arterial disease. The left toe-brachial index is normal. *See table(s) above for measurements and observations.  Electronically signed by Yvonne Barren MD on 11/22/2021 at 2:36:49 PM.    Final    VAS [place] ABI WITH/WO TBI  Result Date: 10/26/2021  LOWER EXTREMITY DOPPLER STUDY Patient Name:  KIKUE GERHART  Date of Exam:   10/26/2021 Medical Rec #: [id number]    Accession #:    [id number] Date of Birth: 02-13-1956   Patient Gender: F Patient Age:   74 years Exam Location:  [facility] Procedure:      VAS [place] ABI WITH/WO TBI Referring Phys: [facility] --------------------------------------------------------------------------------  Indications: Peripheral artery disease.  Vascular Interventions: 10/02/21: Right SFA/popliteal stent with TP trunk/peroneal                         PTAs;                          10/09/21: Left SFA stent with popliteal/TP                         trunk//ATA/peroneal PTAs;. Performing Technologist: Jamse Mead RT, RDMS, RVT  Examination Guidelines: A complete evaluation includes at minimum, Doppler waveform signals and systolic blood pressure reading at the level of bilateral brachial, anterior tibial, and posterior tibial arteries, when vessel segments are accessible. Bilateral testing is considered an integral part of a complete examination. Photoelectric Plethysmograph (PPG) waveforms and toe systolic pressure readings are included as required and additional duplex testing as needed. Limited examinations for reoccurring indications may be performed as noted.  ABI Findings: +---------+------------------+-----+-------------------+------------------+ Right    Rt Pressure (mmHg)IndexWaveform           Comment            +---------+------------------+-----+-------------------+------------------+ Brachial 148                                                          +---------+------------------+-----+-------------------+------------------+ ATA                             dampened monophasicdetected by duplex +---------+------------------+-----+-------------------+------------------+ PTA                             not detected                          +---------+------------------+-----+-------------------+------------------+ PERO     45                0.30 dampened monophasic                   +---------+------------------+-----+-------------------+------------------+ Great Toe21                0.14 Abnormal                              +---------+------------------+-----+-------------------+------------------+ +---------+------------------+-----+------------+-------+  Left     Lt Pressure (mmHg)IndexWaveform    Comment +---------+------------------+-----+------------+-------+ Brachial 139                                         +---------+------------------+-----+------------+-------+ ATA      132               0.89 hyperemic           +---------+------------------+-----+------------+-------+ PTA                             not detected        +---------+------------------+-----+------------+-------+ PERO     134               0.91 hyperemic           +---------+------------------+-----+------------+-------+ Great Toe83                0.56 Normal              +---------+------------------+-----+------------+-------+ +-------+-----------+-----------+------------+------------+ ABI/TBIToday's ABIToday's TBIPrevious ABIPrevious TBI +-------+-----------+-----------+------------+------------+ Right  0.30       0.14       0.52        0.24         +-------+-----------+-----------+------------+------------+ Left   0.91       0.56       0.61        0.63         +-------+-----------+-----------+------------+------------+ Right ABIs appear decreased compared to prior study on 09/15/21. Left ABIs appear increased.  Summary: Right: Resting right ankle-brachial index indicates severe right lower extremity arterial disease. Left: Resting left ankle-brachial index indicates mild left lower extremity arterial disease. The left toe-brachial index is abnormal. *See table(s) above for measurements and observations.  Electronically signed by Levora Dredge MD on 10/26/2021 at 4:17:03 PM.    Final        Assessment & Plan:   1. Peripheral arterial disease (HCC) Recommend:  The patient is status post successful angiogram with intervention.  The patient reports that the claudication symptoms and leg pain has improved.   The patient denies lifestyle limiting changes at this point in time.  No further invasive studies, angiography or surgery at this time The patient should continue walking and begin a more formal exercise program.  The patient should continue antiplatelet therapy and aggressive treatment of the  lipid abnormalities  Continued surveillance is indicated as atherosclerosis is likely to progress with time.    Patient should undergo noninvasive studies as ordered. The patient will follow up with me to review the studies.    2. Hyperlipidemia, mixed Continue statin as ordered and reviewed, no changes at this time   3. Benign essential HTN Continue antihypertensive medications as already ordered, these medications have been reviewed and there are no changes at this time.    Current Outpatient Medications on File Prior to Visit  Medication Sig Dispense Refill   amLODipine (NORVASC) 10 MG tablet Take 10 mg by mouth daily.     apixaban (ELIQUIS) 5 MG TABS tablet Take 1 tablet (5 mg total) by mouth 2 (two) times daily. 60 tablet 0   aspirin EC 81 MG tablet Take 81 mg by mouth daily. Swallow whole.     atorvastatin (LIPITOR) 10 MG tablet Take 10 mg by mouth daily.     cholecalciferol (VITAMIN D3) 25  MCG (1000 UNIT) tablet Take 1,000 Units by mouth daily.     HYDROcodone-acetaminophen (NORCO/VICODIN) 5-325 MG tablet Take 1 tablet by mouth every 6 (six) hours as needed for moderate pain. 20 tablet 0   metoprolol tartrate (LOPRESSOR) 25 MG tablet Take 12.5 mg by mouth 2 (two) times daily.     ondansetron (ZOFRAN) 4 MG tablet Take 1 tablet (4 mg total) by mouth daily as needed for nausea or vomiting. (Patient not taking: Reported on 10/27/2021) 15 tablet 0   No current facility-administered medications on file prior to visit.    There are no Patient Instructions on file for this visit. No follow-ups on file.   Georgiana Spinner, NP

## 2022-02-12 ENCOUNTER — Telehealth (INDEPENDENT_AMBULATORY_CARE_PROVIDER_SITE_OTHER): Payer: Self-pay | Admitting: Nurse Practitioner

## 2022-02-12 NOTE — Telephone Encounter (Signed)
Called and left vm for patient to call back to get scheduled per FB bring in for ABI tomorrow

## 2022-02-12 NOTE — Telephone Encounter (Signed)
Patient states her feet are cold and one is worst than the other.  She states the right foot feels like a block of ice but the left tingles a lot sometimes feels warm.  Please call her at [phone].

## 2022-02-13 ENCOUNTER — Ambulatory Visit (INDEPENDENT_AMBULATORY_CARE_PROVIDER_SITE_OTHER): Payer: Medicare Other | Admitting: Nurse Practitioner

## 2022-02-13 ENCOUNTER — Other Ambulatory Visit (INDEPENDENT_AMBULATORY_CARE_PROVIDER_SITE_OTHER): Payer: Self-pay | Admitting: Nurse Practitioner

## 2022-02-13 ENCOUNTER — Ambulatory Visit (INDEPENDENT_AMBULATORY_CARE_PROVIDER_SITE_OTHER): Payer: Medicare Other

## 2022-02-13 ENCOUNTER — Telehealth (INDEPENDENT_AMBULATORY_CARE_PROVIDER_SITE_OTHER): Payer: Self-pay | Admitting: Nurse Practitioner

## 2022-02-13 ENCOUNTER — Telehealth (INDEPENDENT_AMBULATORY_CARE_PROVIDER_SITE_OTHER): Payer: Self-pay

## 2022-02-13 ENCOUNTER — Encounter (INDEPENDENT_AMBULATORY_CARE_PROVIDER_SITE_OTHER): Payer: Self-pay | Admitting: Nurse Practitioner

## 2022-02-13 VITALS — BP 215/103 | HR 62 | Resp 15 | Wt 106.4 lb

## 2022-02-13 DIAGNOSIS — Z9889 Other specified postprocedural states: Secondary | ICD-10-CM | POA: Diagnosis not present

## 2022-02-13 DIAGNOSIS — R209 Unspecified disturbances of skin sensation: Secondary | ICD-10-CM

## 2022-02-13 DIAGNOSIS — I70249 Atherosclerosis of native arteries of left leg with ulceration of unspecified site: Secondary | ICD-10-CM

## 2022-02-13 DIAGNOSIS — I739 Peripheral vascular disease, unspecified: Secondary | ICD-10-CM

## 2022-02-13 DIAGNOSIS — I10 Essential (primary) hypertension: Secondary | ICD-10-CM | POA: Diagnosis not present

## 2022-02-13 DIAGNOSIS — I70239 Atherosclerosis of native arteries of right leg with ulceration of unspecified site: Secondary | ICD-10-CM

## 2022-02-13 DIAGNOSIS — E782 Mixed hyperlipidemia: Secondary | ICD-10-CM | POA: Diagnosis not present

## 2022-02-13 DIAGNOSIS — F17200 Nicotine dependence, unspecified, uncomplicated: Secondary | ICD-10-CM

## 2022-02-13 MED ORDER — AMLODIPINE BESYLATE 10 MG PO TABS
10.0000 mg | ORAL_TABLET | Freq: Every day | ORAL | 0 refills | Status: DC
Start: 2022-02-13 — End: 2022-03-12

## 2022-02-13 NOTE — Telephone Encounter (Signed)
Patient was seen today and scheduled for a RLE angio with Dr. Lucky Cowboy on 09/21/3 with a 6:45 am arrival time to the [facility]. Pre-procedure instructions were discussed and handed to the patient.

## 2022-02-13 NOTE — H&P (View-Only) (Signed)
Subjective:    Patient ID: Yvonne [name], female    DOB: [birth year], 66 y.o.   MRN: [id number] Chief Complaint  Patient presents with   Follow-up    Cold feet    Yvonne [name] is a 66 year old female who presents today due to having feeling of a cold foot.  She notes that this began on Wednesday.  The patient has previously had ischemic symptoms.  She notes that she stopped smoking in June.  She notes that she has not recently taken one of her blood pressure medications due to being out.  She notes her blood pressure has been elevated since that time as well.  She denies any open wounds or ulcerations.  She does experience claudication as well as rest pain currently.  Today noninvasive studies show an ABI 0.17 on the right 0.73 on the left.  Patient has a TBI 0.08 on the right and 0.41 on the left.  The patient has monophasic anterior tibial artery waveforms with absent posterior tibial and peroneal waveforms on the right.  Patient has monophasic/biphasic waveforms on the left.    Review of Systems  Skin:  Positive for pallor.  All other systems reviewed and are negative.      Objective:   Physical Exam Vitals reviewed.  HENT:     Head: Normocephalic.  Cardiovascular:     Rate and Rhythm: Normal rate.  Pulmonary:     Effort: Pulmonary effort is normal.  Skin:    General: Skin is dry.  Neurological:     Mental Status: She is alert and oriented to person, place, and time.  Psychiatric:        Mood and Affect: Mood normal.        Behavior: Behavior normal.        Thought Content: Thought content normal.        Judgment: Judgment normal.     BP (!) 215/103 (BP Location: Right Arm)   Pulse 62   Resp 15   Wt 106 lb 6.4 oz (48.3 kg)   BMI 18.85 kg/m   Past Medical History:  Diagnosis Date   Hyperlipidemia    Hypertension    Peripheral vascular disease ([place])    Syncope     Social History   Socioeconomic History   Marital status: Divorced    Spouse name: Not on file    Number of children: 2   Years of education: Not on file   Highest education level: Not on file  Occupational History   Not on file  Tobacco Use   Smoking status: Former    Packs/day: 1.00    Types: Cigarettes    Quit date: 10/27/2021    Years since quitting: 0.3   Smokeless tobacco: Never  Substance and Sexual Activity   Alcohol use: Yes    Alcohol/week: 6.0 standard drinks of alcohol    Types: 6 Glasses of wine per week   Drug use: Never   Sexual activity: Not on file  Other Topics Concern   Not on file  Social History Narrative   Lives with her brother Marcello Moores in [place] Strain: Not on file  Food Insecurity: Not on file  Transportation Needs: Not on file  Physical Activity: Not on file  Stress: Not on file  Social Connections: Not on file  Intimate Partner Violence: Not on file    Past Surgical History:  Procedure Laterality Date   LOWER EXTREMITY ANGIOGRAPHY  Right 10/02/2021   Procedure: Lower Extremity Angiography;  Surgeon: Annice Needy, MD;  Location: [facility] INVASIVE CV LAB;  Service: Cardiovascular;  Laterality: Right;   LOWER EXTREMITY ANGIOGRAPHY Left 10/09/2021   Procedure: Lower Extremity Angiography;  Surgeon: Annice Needy, MD;  Location: [facility] INVASIVE CV LAB;  Service: Cardiovascular;  Laterality: Left;   LOWER EXTREMITY ANGIOGRAPHY Right 10/27/2021   Procedure: Lower Extremity Angiography;  Surgeon: Annice Needy, MD;  Location: [facility] INVASIVE CV LAB;  Service: Cardiovascular;  Laterality: Right;   LOWER EXTREMITY INTERVENTION Right 10/27/2021   Procedure: LOWER EXTREMITY INTERVENTION;  Surgeon: Annice Needy, MD;  Location: [facility] INVASIVE CV LAB;  Service: Cardiovascular;  Laterality: Right;   PARTIAL HYSTERECTOMY      History reviewed. No pertinent family history.  No Known Allergies     Latest Ref Rng & Units 10/28/2021    4:29 AM 10/27/2021    3:41 PM 03/21/2021    4:24 AM  CBC  WBC 4.0 - 10.5 K/uL 9.7   11.2  12.4   Hemoglobin 12.0 - 15.0 g/dL 9.7  16.1  09.6   Hematocrit 36.0 - 46.0 % 29.9  36.9  38.7   Platelets 150 - 400 K/uL 264  301  189       CMP     Component Value Date/Time   NA 141 10/27/2021 1541   K 4.0 10/27/2021 1541   CL 108 10/27/2021 1541   CO2 26 10/27/2021 1541   GLUCOSE 107 (H) 10/27/2021 1541   BUN 10 10/27/2021 1541   CREATININE 0.49 10/27/2021 1541   CALCIUM 8.9 10/27/2021 1541   PROT 7.6 10/27/2021 1541   ALBUMIN 3.4 (L) 10/27/2021 1541   AST 21 10/27/2021 1541   ALT 19 10/27/2021 1541   ALKPHOS 92 10/27/2021 1541   BILITOT 0.5 10/27/2021 1541   GFRNONAA >60 10/27/2021 1541   GFRAA >60 12/02/2019 1055     No results found.     Assessment & Plan:   1. Peripheral arterial disease (HCC) Recommend:  The patient has evidence of severe atherosclerotic changes of both lower extremities with rest pain that is associated with preulcerative changes and impending tissue loss of the right foot.  This represents a limb threatening ischemia and places the patient at the risk for right limb loss.  Patient should undergo angiography of the right lower extremity with the hope for intervention for limb salvage.  The risks and benefits as well as the alternative therapies was discussed in detail with the patient.  All questions were answered.  Patient agrees to proceed with right lower extremity angiography.  The patient will follow up with me in the office after the procedure.       2. Hyperlipidemia, mixed Continue statin as ordered and reviewed, no changes at this time   3. Benign essential HTN Elevated blood pressure today but she notes that she ran out of her medication and has been out for several days.  I suspect this may also be a contributing cause due to the severe vasoconstriction caused by profound hypotension.  We will send in refill the patient's medication.  This is a one-time refill.  Patient should continue to follow-up with PCP for continued  management of her hypertension.  4. Smoker Patient stopped smoking in June.  She is congratulated and smoking abstinence is encouraged.   Current Outpatient Medications on File Prior to Visit  Medication Sig Dispense Refill   apixaban (ELIQUIS) 5 MG TABS tablet Take 1  tablet (5 mg total) by mouth 2 (two) times daily. 60 tablet 0   aspirin EC 81 MG tablet Take 81 mg by mouth daily. Swallow whole.     atorvastatin (LIPITOR) 10 MG tablet Take 10 mg by mouth daily.     cholecalciferol (VITAMIN D3) 25 MCG (1000 UNIT) tablet Take 1,000 Units by mouth daily.     metoprolol tartrate (LOPRESSOR) 25 MG tablet Take 12.5 mg by mouth 2 (two) times daily.     HYDROcodone-acetaminophen (NORCO/VICODIN) 5-325 MG tablet Take 1 tablet by mouth every 6 (six) hours as needed for moderate pain. (Patient not taking: Reported on 02/13/2022) 20 tablet 0   ondansetron (ZOFRAN) 4 MG tablet Take 1 tablet (4 mg total) by mouth daily as needed for nausea or vomiting. (Patient not taking: Reported on 10/27/2021) 15 tablet 0   No current facility-administered medications on file prior to visit.    There are no Patient Instructions on file for this visit. No follow-ups on file.   Georgiana Spinner, NP

## 2022-02-13 NOTE — Telephone Encounter (Signed)
Tried calling patient several times for an appointment but kept going to voicemail.  Call sister as well and she did not have a number for her. She stated she would message her for her to return call.

## 2022-02-13 NOTE — Telephone Encounter (Signed)
Called patient today, 02/13/22 to bring her in today but no answer.  LVM for a return call.

## 2022-02-13 NOTE — Telephone Encounter (Signed)
Tried calling pt. For an appointment but phone kept going to voicemail.  Also called sister and she stated she did not have a number for her and could only message her.  She stated she would message her to return call.

## 2022-02-13 NOTE — Progress Notes (Signed)
Subjective:    Patient ID: Yvonne [name], female    DOB: [birth year], 66 y.o.   MRN: [id number] Chief Complaint  Patient presents with   Follow-up    Cold feet    Yvonne [name] is a 66 year old female who presents today due to having feeling of a cold foot.  She notes that this began on Wednesday.  The patient has previously had ischemic symptoms.  She notes that she stopped smoking in June.  She notes that she has not recently taken one of her blood pressure medications due to being out.  She notes her blood pressure has been elevated since that time as well.  She denies any open wounds or ulcerations.  She does experience claudication as well as rest pain currently.  Today noninvasive studies show an ABI 0.17 on the right 0.73 on the left.  Patient has a TBI 0.08 on the right and 0.41 on the left.  The patient has monophasic anterior tibial artery waveforms with absent posterior tibial and peroneal waveforms on the right.  Patient has monophasic/biphasic waveforms on the left.    Review of Systems  Skin:  Positive for pallor.  All other systems reviewed and are negative.      Objective:   Physical Exam Vitals reviewed.  HENT:     Head: Normocephalic.  Cardiovascular:     Rate and Rhythm: Normal rate.  Pulmonary:     Effort: Pulmonary effort is normal.  Skin:    General: Skin is dry.  Neurological:     Mental Status: She is alert and oriented to person, place, and time.  Psychiatric:        Mood and Affect: Mood normal.        Behavior: Behavior normal.        Thought Content: Thought content normal.        Judgment: Judgment normal.     BP (!) 215/103 (BP Location: Right Arm)   Pulse 62   Resp 15   Wt 106 lb 6.4 oz (48.3 kg)   BMI 18.85 kg/m   Past Medical History:  Diagnosis Date   Hyperlipidemia    Hypertension    Peripheral vascular disease ([place])    Syncope     Social History   Socioeconomic History   Marital status: Divorced    Spouse name: Not on file    Number of children: 2   Years of education: Not on file   Highest education level: Not on file  Occupational History   Not on file  Tobacco Use   Smoking status: Former    Packs/day: 1.00    Types: Cigarettes    Quit date: 10/27/2021    Years since quitting: 0.3   Smokeless tobacco: Never  Substance and Sexual Activity   Alcohol use: Yes    Alcohol/week: 6.0 standard drinks of alcohol    Types: 6 Glasses of wine per week   Drug use: Never   Sexual activity: Not on file  Other Topics Concern   Not on file  Social History Narrative   Lives with her brother Marcello Moores in [place] Strain: Not on file  Food Insecurity: Not on file  Transportation Needs: Not on file  Physical Activity: Not on file  Stress: Not on file  Social Connections: Not on file  Intimate Partner Violence: Not on file    Past Surgical History:  Procedure Laterality Date   LOWER EXTREMITY ANGIOGRAPHY  Right 10/02/2021   Procedure: Lower Extremity Angiography;  Surgeon: Annice Needy, MD;  Location: [facility] INVASIVE CV LAB;  Service: Cardiovascular;  Laterality: Right;   LOWER EXTREMITY ANGIOGRAPHY Left 10/09/2021   Procedure: Lower Extremity Angiography;  Surgeon: Annice Needy, MD;  Location: [facility] INVASIVE CV LAB;  Service: Cardiovascular;  Laterality: Left;   LOWER EXTREMITY ANGIOGRAPHY Right 10/27/2021   Procedure: Lower Extremity Angiography;  Surgeon: Annice Needy, MD;  Location: [facility] INVASIVE CV LAB;  Service: Cardiovascular;  Laterality: Right;   LOWER EXTREMITY INTERVENTION Right 10/27/2021   Procedure: LOWER EXTREMITY INTERVENTION;  Surgeon: Annice Needy, MD;  Location: [facility] INVASIVE CV LAB;  Service: Cardiovascular;  Laterality: Right;   PARTIAL HYSTERECTOMY      History reviewed. No pertinent family history.  No Known Allergies     Latest Ref Rng & Units 10/28/2021    4:29 AM 10/27/2021    3:41 PM 03/21/2021    4:24 AM  CBC  WBC 4.0 - 10.5 K/uL 9.7   11.2  12.4   Hemoglobin 12.0 - 15.0 g/dL 9.7  16.1  09.6   Hematocrit 36.0 - 46.0 % 29.9  36.9  38.7   Platelets 150 - 400 K/uL 264  301  189       CMP     Component Value Date/Time   NA 141 10/27/2021 1541   K 4.0 10/27/2021 1541   CL 108 10/27/2021 1541   CO2 26 10/27/2021 1541   GLUCOSE 107 (H) 10/27/2021 1541   BUN 10 10/27/2021 1541   CREATININE 0.49 10/27/2021 1541   CALCIUM 8.9 10/27/2021 1541   PROT 7.6 10/27/2021 1541   ALBUMIN 3.4 (L) 10/27/2021 1541   AST 21 10/27/2021 1541   ALT 19 10/27/2021 1541   ALKPHOS 92 10/27/2021 1541   BILITOT 0.5 10/27/2021 1541   GFRNONAA >60 10/27/2021 1541   GFRAA >60 12/02/2019 1055     No results found.     Assessment & Plan:   1. Peripheral arterial disease (HCC) Recommend:  The patient has evidence of severe atherosclerotic changes of both lower extremities with rest pain that is associated with preulcerative changes and impending tissue loss of the right foot.  This represents a limb threatening ischemia and places the patient at the risk for right limb loss.  Patient should undergo angiography of the right lower extremity with the hope for intervention for limb salvage.  The risks and benefits as well as the alternative therapies was discussed in detail with the patient.  All questions were answered.  Patient agrees to proceed with right lower extremity angiography.  The patient will follow up with me in the office after the procedure.       2. Hyperlipidemia, mixed Continue statin as ordered and reviewed, no changes at this time   3. Benign essential HTN Elevated blood pressure today but she notes that she ran out of her medication and has been out for several days.  I suspect this may also be a contributing cause due to the severe vasoconstriction caused by profound hypotension.  We will send in refill the patient's medication.  This is a one-time refill.  Patient should continue to follow-up with PCP for continued  management of her hypertension.  4. Smoker Patient stopped smoking in June.  She is congratulated and smoking abstinence is encouraged.   Current Outpatient Medications on File Prior to Visit  Medication Sig Dispense Refill   apixaban (ELIQUIS) 5 MG TABS tablet Take 1  tablet (5 mg total) by mouth 2 (two) times daily. 60 tablet 0   aspirin EC 81 MG tablet Take 81 mg by mouth daily. Swallow whole.     atorvastatin (LIPITOR) 10 MG tablet Take 10 mg by mouth daily.     cholecalciferol (VITAMIN D3) 25 MCG (1000 UNIT) tablet Take 1,000 Units by mouth daily.     metoprolol tartrate (LOPRESSOR) 25 MG tablet Take 12.5 mg by mouth 2 (two) times daily.     HYDROcodone-acetaminophen (NORCO/VICODIN) 5-325 MG tablet Take 1 tablet by mouth every 6 (six) hours as needed for moderate pain. (Patient not taking: Reported on 02/13/2022) 20 tablet 0   ondansetron (ZOFRAN) 4 MG tablet Take 1 tablet (4 mg total) by mouth daily as needed for nausea or vomiting. (Patient not taking: Reported on 10/27/2021) 15 tablet 0   No current facility-administered medications on file prior to visit.    There are no Patient Instructions on file for this visit. No follow-ups on file.   Georgiana Spinner, NP

## 2022-02-14 ENCOUNTER — Encounter (INDEPENDENT_AMBULATORY_CARE_PROVIDER_SITE_OTHER): Payer: Self-pay | Admitting: Nurse Practitioner

## 2022-02-15 ENCOUNTER — Observation Stay
Admission: RE | Admit: 2022-02-15 | Discharge: 2022-02-16 | Disposition: A | Discharge status: Home / Self Care | Payer: Medicare Other | Attending: Vascular Surgery | Admitting: Vascular Surgery

## 2022-02-15 ENCOUNTER — Encounter
Admission: RE | Disposition: A | Discharge status: Home / Self Care | Payer: Self-pay | Source: Home / Self Care | Attending: Vascular Surgery

## 2022-02-15 ENCOUNTER — Other Ambulatory Visit: Payer: Self-pay

## 2022-02-15 ENCOUNTER — Encounter (INDEPENDENT_AMBULATORY_CARE_PROVIDER_SITE_OTHER): Payer: Medicare Other

## 2022-02-15 ENCOUNTER — Ambulatory Visit (INDEPENDENT_AMBULATORY_CARE_PROVIDER_SITE_OTHER): Payer: Medicare Other | Admitting: Nurse Practitioner

## 2022-02-15 ENCOUNTER — Encounter: Payer: Self-pay | Admitting: Vascular Surgery

## 2022-02-15 DIAGNOSIS — I10 Essential (primary) hypertension: Secondary | ICD-10-CM | POA: Diagnosis not present

## 2022-02-15 DIAGNOSIS — I70221 Atherosclerosis of native arteries of extremities with rest pain, right leg: Secondary | ICD-10-CM | POA: Diagnosis not present

## 2022-02-15 DIAGNOSIS — I743 Embolism and thrombosis of arteries of the lower extremities: Secondary | ICD-10-CM

## 2022-02-15 DIAGNOSIS — M79661 Pain in right lower leg: Secondary | ICD-10-CM

## 2022-02-15 DIAGNOSIS — Z9889 Other specified postprocedural states: Secondary | ICD-10-CM

## 2022-02-15 DIAGNOSIS — I70223 Atherosclerosis of native arteries of extremities with rest pain, bilateral legs: Secondary | ICD-10-CM | POA: Diagnosis not present

## 2022-02-15 DIAGNOSIS — I739 Peripheral vascular disease, unspecified: Secondary | ICD-10-CM | POA: Diagnosis present

## 2022-02-15 DIAGNOSIS — Z87891 Personal history of nicotine dependence: Secondary | ICD-10-CM | POA: Insufficient documentation

## 2022-02-15 DIAGNOSIS — I998 Other disorder of circulatory system: Principal | ICD-10-CM

## 2022-02-15 DIAGNOSIS — E782 Mixed hyperlipidemia: Secondary | ICD-10-CM | POA: Diagnosis not present

## 2022-02-15 DIAGNOSIS — T82868A Thrombosis of vascular prosthetic devices, implants and grafts, initial encounter: Secondary | ICD-10-CM | POA: Diagnosis not present

## 2022-02-15 HISTORY — PX: LOWER EXTREMITY INTERVENTION: CATH118252

## 2022-02-15 HISTORY — PX: LOWER EXTREMITY ANGIOGRAPHY: CATH118251

## 2022-02-15 LAB — CREATININE, SERUM
Creatinine, Ser: 0.43 mg/dL — ABNORMAL LOW (ref 0.44–1.00)
GFR, Estimated: >60 mL/min (ref >60)

## 2022-02-15 LAB — COMPREHENSIVE METABOLIC PANEL
ALT: 117 U/L — ABNORMAL HIGH (ref 0–44)
AST: 142 U/L — ABNORMAL HIGH (ref 15–41)
Albumin: 3.8 g/dL (ref 3.5–5.0)
Alkaline Phosphatase: 127 U/L — ABNORMAL HIGH (ref 38–126)
Anion gap: 10 (ref 5–15)
BUN: 9 mg/dL (ref 8–23)
CO2: 24 mmol/L (ref 22–32)
Calcium: 8.7 mg/dL — ABNORMAL LOW (ref 8.9–10.3)
Chloride: 105 mmol/L (ref 98–111)
Creatinine, Ser: 0.46 mg/dL (ref 0.44–1.00)
GFR, Estimated: >60 mL/min (ref >60)
Glucose, Bld: 134 mg/dL — ABNORMAL HIGH (ref 70–99)
Potassium: 3.4 mmol/L — ABNORMAL LOW (ref 3.5–5.1)
Sodium: 139 mmol/L (ref 135–145)
Total Bilirubin: 0.8 mg/dL (ref 0.3–1.2)
Total Protein: 7.1 g/dL (ref 6.5–8.1)

## 2022-02-15 LAB — CBC
HCT: 36.9 % (ref 36.0–46.0)
Hemoglobin: 12.6 g/dL (ref 12.0–15.0)
MCH: 33.2 pg (ref 26.0–34.0)
MCHC: 34.1 g/dL (ref 30.0–36.0)
MCV: 97.4 fL (ref 80.0–100.0)
Platelets: 189 10*3/uL (ref 150–400)
RBC: 3.79 MIL/uL — ABNORMAL LOW (ref 3.87–5.11)
RDW: 13 % (ref 11.5–15.5)
WBC: 6.6 10*3/uL (ref 4.0–10.5)
nRBC: 0 % (ref 0.0–0.2)

## 2022-02-15 LAB — BASIC METABOLIC PANEL
Anion gap: 10 (ref 5–15)
BUN: 7 mg/dL — ABNORMAL LOW (ref 8–23)
CO2: 24 mmol/L (ref 22–32)
Calcium: 8.3 mg/dL — ABNORMAL LOW (ref 8.9–10.3)
Chloride: 104 mmol/L (ref 98–111)
Creatinine, Ser: 0.44 mg/dL (ref 0.44–1.00)
GFR, Estimated: >60 mL/min (ref >60)
Glucose, Bld: 127 mg/dL — ABNORMAL HIGH (ref 70–99)
Potassium: 3.6 mmol/L (ref 3.5–5.1)
Sodium: 138 mmol/L (ref 135–145)

## 2022-02-15 LAB — PROTIME-INR
INR: 1.2 (ref 0.8–1.2)
Prothrombin Time: 15.2 seconds (ref 11.4–15.2)

## 2022-02-15 LAB — BUN: BUN: 12 mg/dL (ref 8–23)

## 2022-02-15 LAB — GLUCOSE, CAPILLARY: Glucose-Capillary: 104 mg/dL — ABNORMAL HIGH (ref 70–99)

## 2022-02-15 SURGERY — LOWER EXTREMITY ANGIOGRAPHY
Anesthesia: Moderate Sedation | Site: Leg Lower | Laterality: Right

## 2022-02-15 SURGERY — LOWER EXTREMITY INTERVENTION
Anesthesia: Moderate Sedation | Laterality: Right

## 2022-02-15 MED ORDER — MIDAZOLAM HCL 2 MG/2ML IJ SOLN
INTRAMUSCULAR | Status: DC | PRN
  Administered 2022-02-15: 2 mg via INTRAVENOUS
  Administered 2022-02-15 (×2): 1 mg via INTRAVENOUS

## 2022-02-15 MED ORDER — MIDAZOLAM HCL 2 MG/2ML IJ SOLN
INTRAMUSCULAR | Status: AC
  Filled 2022-02-15: qty 4

## 2022-02-15 MED ORDER — APIXABAN 5 MG PO TABS
5.0000 mg | ORAL_TABLET | Freq: Two times a day (BID) | ORAL | Status: DC
  Administered 2022-02-16: 5 mg via ORAL
  Filled 2022-02-15: qty 1

## 2022-02-15 MED ORDER — HEPARIN (PORCINE) 25000 UT/250ML-% IV SOLN
600.0000 [IU]/h | INTRAVENOUS | Status: DC
  Administered 2022-02-15: 600 [IU]/h via INTRAVENOUS

## 2022-02-15 MED ORDER — SODIUM CHLORIDE 0.9 % IV SOLN: INTRAVENOUS | Status: DC

## 2022-02-15 MED ORDER — METOPROLOL TARTRATE 25 MG PO TABS
12.5000 mg | ORAL_TABLET | Freq: Two times a day (BID) | ORAL | Status: DC
  Administered 2022-02-15: 12.5 mg via ORAL
  Filled 2022-02-15: qty 1

## 2022-02-15 MED ORDER — ONDANSETRON HCL 4 MG/2ML IJ SOLN: 4.0000 mg | Freq: Four times a day (QID) | INTRAMUSCULAR | Status: DC | PRN

## 2022-02-15 MED ORDER — ALTEPLASE 1 MG/ML SYRINGE FOR VASCULAR PROCEDURE
INTRAMUSCULAR | Status: DC | PRN
  Administered 2022-02-15: 4 mg via INTRA_ARTERIAL
  Administered 2022-02-15: 8 mg via INTRA_ARTERIAL

## 2022-02-15 MED ORDER — ATORVASTATIN CALCIUM 10 MG PO TABS
10.0000 mg | ORAL_TABLET | Freq: Every day | ORAL | Status: DC
  Administered 2022-02-16: 10 mg via ORAL
  Filled 2022-02-15: qty 1

## 2022-02-15 MED ORDER — FENTANYL CITRATE PF 50 MCG/ML IJ SOSY
PREFILLED_SYRINGE | INTRAMUSCULAR | Status: AC
  Filled 2022-02-15: qty 2

## 2022-02-15 MED ORDER — HYDROMORPHONE HCL 1 MG/ML IJ SOLN
1.0000 mg | Freq: Once | INTRAMUSCULAR | Status: AC | PRN
  Administered 2022-02-15: 0.5 mg via INTRAVENOUS

## 2022-02-15 MED ORDER — CEFAZOLIN SODIUM-DEXTROSE 2-4 GM/100ML-% IV SOLN
2.0000 g | Freq: Once | INTRAVENOUS | Status: AC
  Administered 2022-02-15: 2 g via INTRAVENOUS

## 2022-02-15 MED ORDER — MIDAZOLAM HCL 2 MG/2ML IJ SOLN
INTRAMUSCULAR | Status: DC | PRN
  Administered 2022-02-15: 2 mg via INTRAVENOUS

## 2022-02-15 MED ORDER — PHENOL 1.4 % MT LIQD: 1.0000 | OROMUCOSAL | Status: DC | PRN

## 2022-02-15 MED ORDER — POTASSIUM CHLORIDE CRYS ER 20 MEQ PO TBCR
20.0000 meq | EXTENDED_RELEASE_TABLET | Freq: Once | ORAL | Status: AC
  Administered 2022-02-15: 20 meq via ORAL
  Filled 2022-02-15: qty 1

## 2022-02-15 MED ORDER — TIROFIBAN (AGGRASTAT) BOLUS VIA INFUSION: 25.0000 ug/kg | Freq: Once | INTRAVENOUS | Status: AC

## 2022-02-15 MED ORDER — ONDANSETRON HCL 4 MG PO TABS: 4.0000 mg | ORAL_TABLET | Freq: Every day | ORAL | Status: DC | PRN

## 2022-02-15 MED ORDER — FAMOTIDINE 20 MG PO TABS: 40.0000 mg | ORAL_TABLET | Freq: Once | ORAL | Status: DC | PRN

## 2022-02-15 MED ORDER — LABETALOL HCL 5 MG/ML IV SOLN
10.0000 mg | INTRAVENOUS | Status: DC | PRN
  Administered 2022-02-15: 10 mg via INTRAVENOUS
  Filled 2022-02-15: qty 4

## 2022-02-15 MED ORDER — OXYCODONE HCL 5 MG PO TABS
10.0000 mg | ORAL_TABLET | ORAL | Status: DC | PRN
  Administered 2022-02-15 – 2022-02-16 (×3): 10 mg via ORAL
  Filled 2022-02-15 (×3): qty 2

## 2022-02-15 MED ORDER — FENTANYL CITRATE (PF) 100 MCG/2ML IJ SOLN
INTRAMUSCULAR | Status: DC | PRN
  Administered 2022-02-15 (×2): 25 ug via INTRAVENOUS
  Administered 2022-02-15: 50 ug via INTRAVENOUS

## 2022-02-15 MED ORDER — IODIXANOL 320 MG/ML IV SOLN
INTRAVENOUS | Status: DC | PRN
  Administered 2022-02-15: 35 mL via INTRA_ARTERIAL

## 2022-02-15 MED ORDER — HYDROCODONE-ACETAMINOPHEN 5-325 MG PO TABS
1.0000 | ORAL_TABLET | Freq: Four times a day (QID) | ORAL | Status: DC | PRN
  Administered 2022-02-16: 1 via ORAL
  Filled 2022-02-15: qty 1

## 2022-02-15 MED ORDER — HEPARIN SODIUM (PORCINE) 1000 UNIT/ML IJ SOLN
INTRAMUSCULAR | Status: DC | PRN
  Administered 2022-02-15: 4000 [IU] via INTRAVENOUS

## 2022-02-15 MED ORDER — HYDROMORPHONE HCL 1 MG/ML IJ SOLN: 1.0000 mg | INTRAMUSCULAR | Status: DC | PRN

## 2022-02-15 MED ORDER — GUAIFENESIN-DM 100-10 MG/5ML PO SYRP: 15.0000 mL | ORAL_SOLUTION | ORAL | Status: DC | PRN

## 2022-02-15 MED ORDER — HEPARIN SODIUM (PORCINE) 1000 UNIT/ML IJ SOLN
INTRAMUSCULAR | Status: DC | PRN
  Administered 2022-02-15: 3000 [IU] via INTRAVENOUS

## 2022-02-15 MED ORDER — HYDROMORPHONE HCL 1 MG/ML IJ SOLN
INTRAMUSCULAR | Status: AC
  Administered 2022-02-15: 0.5 mg
  Filled 2022-02-15: qty 0.5

## 2022-02-15 MED ORDER — METOPROLOL TARTRATE 5 MG/5ML IV SOLN: 2.0000 mg | INTRAVENOUS | Status: DC | PRN

## 2022-02-15 MED ORDER — HEPARIN SODIUM (PORCINE) 1000 UNIT/ML IJ SOLN
INTRAMUSCULAR | Status: AC
  Filled 2022-02-15: qty 10

## 2022-02-15 MED ORDER — FENTANYL CITRATE (PF) 100 MCG/2ML IJ SOLN
INTRAMUSCULAR | Status: DC | PRN
  Administered 2022-02-15: 50 ug via INTRAVENOUS

## 2022-02-15 MED ORDER — MORPHINE SULFATE (PF) 2 MG/ML IV SOLN: 1.0000 mg | INTRAVENOUS | Status: DC | PRN

## 2022-02-15 MED ORDER — ALUM & MAG HYDROXIDE-SIMETH 200-200-20 MG/5ML PO SUSP: 15.0000 mL | ORAL | Status: DC | PRN

## 2022-02-15 MED ORDER — HYDROMORPHONE HCL 1 MG/ML IJ SOLN
INTRAMUSCULAR | Status: AC
  Filled 2022-02-15: qty 0.5

## 2022-02-15 MED ORDER — ASPIRIN 81 MG PO TBEC
81.0000 mg | DELAYED_RELEASE_TABLET | Freq: Every day | ORAL | Status: DC
  Administered 2022-02-16: 81 mg via ORAL
  Filled 2022-02-15: qty 1

## 2022-02-15 MED ORDER — PANTOPRAZOLE SODIUM 40 MG PO TBEC
40.0000 mg | DELAYED_RELEASE_TABLET | Freq: Every day | ORAL | Status: DC
  Administered 2022-02-15 – 2022-02-16 (×2): 40 mg via ORAL
  Filled 2022-02-15 (×2): qty 1

## 2022-02-15 MED ORDER — METHYLPREDNISOLONE SODIUM SUCC 125 MG IJ SOLR: 125.0000 mg | Freq: Once | INTRAMUSCULAR | Status: DC | PRN

## 2022-02-15 MED ORDER — CEFAZOLIN SODIUM-DEXTROSE 2-4 GM/100ML-% IV SOLN
2.0000 g | INTRAVENOUS | Status: AC
  Administered 2022-02-15: 2 g via INTRAVENOUS

## 2022-02-15 MED ORDER — CHLORHEXIDINE GLUCONATE CLOTH 2 % EX PADS
6.0000 | MEDICATED_PAD | Freq: Every day | CUTANEOUS | Status: DC
  Administered 2022-02-16 (×2): 6 via TOPICAL

## 2022-02-15 MED ORDER — SODIUM CHLORIDE 0.9 % IV SOLN
0.5000 mg/h | INTRAVENOUS | Status: DC
  Administered 2022-02-15: 0.5 mg/h
  Filled 2022-02-15: qty 10

## 2022-02-15 MED ORDER — ALTEPLASE 2 MG IJ SOLR
INTRAMUSCULAR | Status: AC
  Filled 2022-02-15: qty 8

## 2022-02-15 MED ORDER — ALTEPLASE 2 MG IJ SOLR
INTRAMUSCULAR | Status: AC
  Filled 2022-02-15: qty 4

## 2022-02-15 MED ORDER — HEPARIN (PORCINE) 25000 UT/250ML-% IV SOLN
INTRAVENOUS | Status: AC
  Filled 2022-02-15: qty 250

## 2022-02-15 MED ORDER — TIROFIBAN HCL IV 12.5 MG/250 ML
INTRAVENOUS | Status: AC
  Administered 2022-02-15: 1202.5 ug via INTRAVENOUS
  Filled 2022-02-15: qty 250

## 2022-02-15 MED ORDER — AMLODIPINE BESYLATE 10 MG PO TABS
10.0000 mg | ORAL_TABLET | Freq: Every day | ORAL | Status: DC
  Administered 2022-02-16: 10 mg via ORAL
  Filled 2022-02-15: qty 1

## 2022-02-15 MED ORDER — HYDROMORPHONE HCL 1 MG/ML IJ SOLN
1.0000 mg | Freq: Once | INTRAMUSCULAR | Status: AC | PRN
  Administered 2022-02-15: 1 mg via INTRAVENOUS
  Filled 2022-02-15: qty 1

## 2022-02-15 MED ORDER — MIDAZOLAM HCL 2 MG/ML PO SYRP: 8.0000 mg | ORAL_SOLUTION | Freq: Once | ORAL | Status: DC | PRN

## 2022-02-15 MED ORDER — DIPHENHYDRAMINE HCL 50 MG/ML IJ SOLN: 50.0000 mg | Freq: Once | INTRAMUSCULAR | Status: DC | PRN

## 2022-02-15 MED ORDER — HYDRALAZINE HCL 20 MG/ML IJ SOLN: 5.0000 mg | INTRAMUSCULAR | Status: DC | PRN

## 2022-02-15 MED ORDER — TIROFIBAN HCL IN NACL 5-0.9 MG/100ML-% IV SOLN
0.0750 ug/kg/min | INTRAVENOUS | Status: AC
  Administered 2022-02-15: 0.075 ug/kg/min via INTRAVENOUS
  Filled 2022-02-15: qty 100

## 2022-02-15 MED ORDER — FENTANYL CITRATE (PF) 100 MCG/2ML IJ SOLN
INTRAMUSCULAR | Status: AC
  Filled 2022-02-15: qty 2

## 2022-02-15 MED ORDER — TIROFIBAN HCL IN NACL 5-0.9 MG/100ML-% IV SOLN: 0.1500 ug/kg/min | INTRAVENOUS | Status: DC

## 2022-02-15 MED ORDER — SODIUM CHLORIDE 0.9 % IV SOLN
1.0000 mg/h | INTRAVENOUS | Status: DC
  Administered 2022-02-15: 1 mg/h
  Filled 2022-02-15: qty 10

## 2022-02-15 MED ORDER — VITAMIN D 25 MCG (1000 UNIT) PO TABS
1000.0000 [IU] | ORAL_TABLET | Freq: Every day | ORAL | Status: DC
  Administered 2022-02-16: 1000 [IU] via ORAL
  Filled 2022-02-15: qty 1

## 2022-02-15 MED ORDER — HEPARIN (PORCINE) 25000 UT/250ML-% IV SOLN
600.0000 [IU]/h | INTRAVENOUS | Status: DC
  Administered 2022-02-15: 600 [IU]/h via INTRA_ARTERIAL

## 2022-02-15 SURGICAL SUPPLY — 19 items
BALLN LUTONIX 018 4X220X130 (BALLOONS) ×1
BALLOON LUTONIX 018 4X220X130 (BALLOONS) IMPLANT
CANISTER PENUMBRA ENGINE (MISCELLANEOUS) IMPLANT
CATH ANGIO 5F PIGTAIL 65CM (CATHETERS) IMPLANT
CATH INDIGO CAT6 KIT (CATHETERS) IMPLANT
CATH INFUS 135CMX50CM (CATHETERS) IMPLANT
CATH VERT 5X100 (CATHETERS) IMPLANT
COVER DRAPE FLUORO 36X44 (DRAPES) IMPLANT
GLIDEWIRE ADV .035X260CM (WIRE) IMPLANT
KIT ENCORE 26 ADVANTAGE (KITS) IMPLANT
PACK ANGIOGRAPHY (CUSTOM PROCEDURE TRAY) ×1 IMPLANT
SHEATH BRITE TIP 5FRX11 (SHEATH) IMPLANT
SHEATH PINNACLE ST 6F 45CM (SHEATH) IMPLANT
SHEATH PROBE COVER 6X72 (BAG) IMPLANT
SYR MEDRAD MARK 7 150ML (SYRINGE) IMPLANT
TUBING CONTRAST HIGH PRESS 72 (TUBING) IMPLANT
VALVE CHECKFLO PERFORMER (SHEATH) IMPLANT
WIRE G V18X300CM (WIRE) IMPLANT
WIRE GUIDERIGHT .035X150 (WIRE) IMPLANT

## 2022-02-15 SURGICAL SUPPLY — 16 items
BALLN LUTONIX 018 4X100X130 (BALLOONS) ×1
BALLN ULTRVRSE 2X300X150 (BALLOONS) ×1
BALLN ULTRVRSE 2X300X150 OTW (BALLOONS) ×1
BALLOON LUTONIX 018 4X100X130 (BALLOONS) IMPLANT
BALLOON ULTRVRSE 2X300X150 OTW (BALLOONS) IMPLANT
CANISTER PENUMBRA ENGINE (MISCELLANEOUS) IMPLANT
CATH INDIGO CAT6 KIT (CATHETERS) IMPLANT
CHLORAPREP W/TINT 26 (MISCELLANEOUS) IMPLANT
COVER DRAPE FLUORO 36X44 (DRAPES) IMPLANT
DEVICE SAFEGUARD 24CM (GAUZE/BANDAGES/DRESSINGS) IMPLANT
DEVICE STARCLOSE SE CLOSURE (Vascular Products) IMPLANT
KIT ENCORE 26 ADVANTAGE (KITS) IMPLANT
PACK ANGIOGRAPHY (CUSTOM PROCEDURE TRAY) IMPLANT
STENT VIABAHN 5X100X120 (Permanent Stent) IMPLANT
WIRE G V18X300CM (WIRE) IMPLANT
WIRE GUIDERIGHT .035X150 (WIRE) IMPLANT

## 2022-02-15 NOTE — Progress Notes (Signed)
Pt. returned from IR to room, VS are stable pain 3/10.

## 2022-02-15 NOTE — Op Note (Signed)
[facility]  Percutaneous Study/Intervention Procedural Note   Date of Surgery: 02/15/2022  Surgeon(s):Aeric Burnham    Assistants:none  Pre-operative Diagnosis: PAD with rest pain right lower extremity, acute on chronic ischemia  Post-operative diagnosis:  Same  Procedure(s) Performed:             1.  Ultrasound guidance for vascular access left femoral artery             2.  Catheter placement into right common femoral artery from left femoral approach             3.  Aortogram and selective right lower extremity angiogram             4.  Catheter directed thrombolytic therapy with a total of 12 mg tPA in the right SFA and popliteal arteries             5.  Mechanical thrombectomy using the penumbra CAT 6 device in the right SFA and popliteal arteries  6.  Percutaneous transluminal angioplasty of the right SFA and popliteal arteries with 4 mm diameter by 22 cm length Lutonix drug-coated angioplasty balloon             7.  Placement of a 135 cm total length 50 cm working length thrombolytic catheter for continuous infusion of thrombolytic therapy  EBL: 100 cc  Contrast: 35 cc  Fluoro Time: 5.4 minutes  Moderate Conscious Sedation Time: approximately 50 minutes using 4 mg of Versed and 100 mcg of Fentanyl              Indications:  Patient is a 66 y.o.female with a long history of severe peripheral arterial disease with acute on chronic ischemia for approximately 1 week with rest pain in the right leg. The patient has noninvasive study showing occlusion of her previous interventions on the right. The patient is brought in for angiography for further evaluation and potential treatment.  Due to the limb threatening nature of the situation, angiogram was performed for attempted limb salvage. The patient is aware that if the procedure fails, amputation would be expected.  The patient also understands that even with successful revascularization, amputation may still be  required due to the severity of the situation.  Risks and benefits are discussed and informed consent is obtained.   Procedure:  The patient was identified and appropriate procedural time out was performed.  The patient was then placed supine on the table and prepped and draped in the usual sterile fashion. Moderate conscious sedation was administered during a face to face encounter with the patient throughout the procedure with my supervision of the RN administering medicines and monitoring the patient's vital signs, pulse oximetry, telemetry and mental status throughout from the start of the procedure until the patient was taken to the recovery room. Ultrasound was used to evaluate the left common femoral artery.  It was patent .  A digital ultrasound image was acquired.  A Seldinger needle was used to access the left common femoral artery under direct ultrasound guidance and a permanent image was performed.  A 0.035 J wire was advanced without resistance and a 5Fr sheath was placed.  Pigtail catheter was placed into the aorta and an AP aortogram was performed. This demonstrated normal renal arteries and normal aorta and iliac segments without significant stenosis. I then crossed the aortic bifurcation and advanced to the right femoral head. Selective right lower extremity angiogram was then performed. This demonstrated some mild disease in  the common femoral artery and profunda femoris artery that does not appear to be flow-limiting.  There was a flush occlusion of the SFA with stents up to the origin of the SFA.  There was reconstitution of the popliteal artery below the previously placed stents with this with the popliteal artery that appeared to be heavily diseased.  There is also significant tibial disease with what appeared to be an anterior tibial artery that occluded in the midsegment without obvious distal reconstitution.  The peroneal artery appeared to be occluded in the proximal segment did  reconstitute in the mid segment and was continuous distally.  The posterior tibial artery appeared to be occluded. It was felt that it was in the patient's best interest to proceed with intervention after these images to avoid a second procedure and a larger amount of contrast and fluoroscopy based off of the findings from the initial angiogram. The patient was systemically heparinized and a 6 [place] destination sheath was then placed over the Terumo Advantage wire. I then used a Kumpe catheter and the advantage wire to easily cross the occlusion.  Initially, I instilled 8 mg of tPA in the right SFA and popliteal arteries through the Kumpe catheter.  After this dwelled, a V18 wire was placed all the way into the distal peroneal artery without difficulty and the catheter was removed.  A penumbra CAT 6 device was then brought onto the field and 4 passes were made with mechanical thrombectomy to the right SFA and popliteal arteries with the penumbra CAT 6 device.  This resulted in minimal improvement and there was clearly a high-grade stenosis below the previously placed stent that the CAT 6 catheter had a hard time crossing.  I ballooned this area with 4 mm diameter by 22 cm length Lutonix drug-coated angioplasty balloon in the proximal and mid popliteal arteries and distal SFA.  This inflated to 8 atm for 1 minute.  Another pass with the penumbra CAT 6 catheter was performed.  Despite this, there remained continued thrombosis of the SFA and popliteal arteries and previously placed stents and very sluggish flow distally.  I felt our best chance for limb salvage at this point would be a thrombolytic catheter to see if we could restore patency of this area and also to see if her distal runoff can be improved.  At 135 cm total length 50 cm working length catheter.  This catheter provide continuous thrombolytic therapy and I instilled another 4 mg of tPA through this catheter his distal aspect was in the tibioperoneal  trunk and ran the course of the SFA and popliteal arteries.  I elected to terminate the procedure. The sheath was secured with a Prolene suture as well as the thrombolytic catheter.  The patient was taken to the recovery room in stable condition having tolerated the procedure well.  Findings:               Aortogram:  This demonstrated normal renal arteries and normal aorta and iliac segments without significant stenosis.             Right Lower Extremity: This demonstrated some mild disease in the common femoral artery and profunda femoris artery that does not appear to be flow-limiting.  There was a flush occlusion of the SFA with stents up to the origin of the SFA.  There was reconstitution of the popliteal artery below the previously placed stents with this popliteal artery appearing to be significantly diseased.  There is also significant tibial  disease with what appeared to be an anterior tibial artery that occluded in the midsegment without obvious distal reconstitution.  The peroneal artery appeared to be occluded in the proximal segment did reconstitute in the mid segment and was continuous distally.  The posterior tibial artery appeared to be occluded.   Disposition: Patient was taken to the recovery room in stable condition having tolerated the procedure well.  Complications: None  [name] 02/15/2022 9:08 AM   This note was created with Dragon Medical transcription system. Any errors in dictation are purely unintentional.

## 2022-02-15 NOTE — Op Note (Signed)
[facility]  Percutaneous Study/Intervention Procedural Note   Date of Surgery: 02/15/2022  Surgeon(s):Ailey Wessling    Assistants:none  Pre-operative Diagnosis: PAD with rest [name] right lower extremity, acute on chronic ischemia, status post thrombolytic therapy  Post-operative diagnosis:  Same  Procedure(s) Performed:             1.  Selective right lower extremity angiogram             2.  Mechanical thrombectomy to the right SFA, popliteal artery, and tibioperoneal trunk with the penumbra CAT 6 device             3.  Stent placement to the right popliteal artery with 5 mm diameter by 10 cm length Viabahn stent             4.  Percutaneous transluminal angioplasty of the right peroneal artery and tibioperoneal trunk with 2 mm diameter by 30 cm length angioplasty balloon             5.  StarClose closure device left femoral artery  EBL: 50 cc  Contrast: 30 cc  Fluoro Time: 3.2 minutes  Moderate Conscious Sedation Time: approximately 32 minutes using 2 mg of Versed and 50 mcg of Fentanyl              Indications:  Patient is a 66 y.o.female with profound right lower extremity ischemia status post thrombolytic therapy who is brought back for second look angiography. The patient is brought in for angiography for further evaluation and potential treatment.  Due to the limb threatening nature of the situation, angiogram was performed for attempted limb salvage. The patient is aware that if the procedure fails, amputation would be expected.  The patient also understands that even with successful revascularization, amputation may still be required due to the severity of the situation.  Risks and benefits are discussed and informed consent is obtained.   Procedure:  The patient was identified and appropriate procedural time out was performed.  The patient was then placed supine on the table and prepped and draped in the usual sterile fashion. Moderate conscious sedation  was administered during a face to face encounter with the patient throughout the procedure with my supervision of the RN administering medicines and monitoring the patient's vital signs, pulse oximetry, telemetry and mental status throughout from the start of the procedure until the patient was taken to the recovery room.  The existing thrombolytic catheter was removed and a V18 wire was parked distally.  Selective right lower extremity angiogram was then performed. This demonstrated continued near occlusive stenosis of the popliteal artery just below the previously placed stents with associated thrombus in the distal portions of the stent and around the stenosis.  There remained occlusion of the peroneal artery in the proximal and mid segment but this did reconstitute and was the best runoff distally.  The anterior tibial artery occluded in the midsegment and did not reconstitute distally.  The posterior tibial artery was occluded without distal reconstitution.  I then gave additional heparin and proceeded with treatment.  The penumbra CAT 6 device was brought on the field and 2 passes were made for thrombectomy in the right SFA distally, popliteal artery, and down to the tibioperoneal trunk.  Following this, there remained near occlusive stenosis in the area but the thrombus seem to be improved.  I elected to place a 5 mm diameter by 10 cm length Viabahn stent in the right popliteal artery postdilated this with  a 4 mm balloon.  The tibioperoneal trunk and proximal and mid peroneal artery were then treated with a 2 mm diameter by 30 cm length angioplasty balloon inflated to 14 atm for 1 minute.  Completion imaging showed less than 10% residual stenosis in the right popliteal artery after stent placement and less than 20% residual stenosis in the peroneal artery which is now continuous and essentially the only runoff vessel distally.  There was not much further we can do today and her flow was markedly improved  although given the very small vessel size and extensive nature of the disease, I will keep her on Aggrastat overnight and transition her to oral anticoagulation tomorrow.  I elected to terminate the procedure. The sheath was removed and StarClose closure device was deployed in the left femoral artery with excellent hemostatic result. The patient was taken to the recovery room in stable condition having tolerated the procedure well.  Findings:                       Right lower Extremity: Continued near occlusive stenosis of the popliteal artery just below the previously placed stents with associated thrombus in the distal portions of the stent and around the stenosis.  There remained occlusion of the peroneal artery in the proximal and mid segment but this did reconstitute and was the best runoff distally.  The anterior tibial artery occluded in the midsegment and did not reconstitute distally.  The posterior tibial artery was occluded without distal reconstitution.   Disposition: Patient was taken to the recovery room in stable condition having tolerated the procedure well.  Complications: None  Yvonne [name] 02/15/2022 4:55 PM   This note was created with Dragon Medical transcription system. Any errors in dictation are purely unintentional.

## 2022-02-15 NOTE — Interval H&P Note (Signed)
History and Physical Interval Note:  02/15/2022 9:07 AM  Yvonne [name]  has presented today for surgery, with the diagnosis of RLE Angio   BARD   Ischemic leg.  The various methods of treatment have been discussed with the patient and family. After consideration of risks, benefits and other options for treatment, the patient has consented to  Procedure(s): Lower Extremity Angiography (Right) as a surgical intervention.  The patient's history has been reviewed, patient examined, no change in status, stable for surgery.  I have reviewed the patient's chart and labs.  Questions were answered to the patient's satisfaction.     [name]

## 2022-02-15 NOTE — Progress Notes (Signed)
Notified Dr. Lucky Cowboy that I was unable to doppler or palpate right pedal pulses dp or pt. He states this was expected, no new orders

## 2022-02-15 NOTE — Progress Notes (Signed)
Pt. Transferred to IR for procedure.

## 2022-02-16 ENCOUNTER — Encounter: Payer: Self-pay | Admitting: Vascular Surgery

## 2022-02-16 DIAGNOSIS — I998 Other disorder of circulatory system: Secondary | ICD-10-CM | POA: Diagnosis not present

## 2022-02-16 DIAGNOSIS — I70223 Atherosclerosis of native arteries of extremities with rest pain, bilateral legs: Secondary | ICD-10-CM | POA: Diagnosis not present

## 2022-02-16 LAB — CBC
HCT: 33.7 % — ABNORMAL LOW (ref 36.0–46.0)
Hemoglobin: 11.3 g/dL — ABNORMAL LOW (ref 12.0–15.0)
MCH: 33.4 pg (ref 26.0–34.0)
MCHC: 33.5 g/dL (ref 30.0–36.0)
MCV: 99.7 fL (ref 80.0–100.0)
Platelets: 163 10*3/uL (ref 150–400)
RBC: 3.38 MIL/uL — ABNORMAL LOW (ref 3.87–5.11)
RDW: 13.1 % (ref 11.5–15.5)
WBC: 8.2 10*3/uL (ref 4.0–10.5)
nRBC: 0 % (ref 0.0–0.2)

## 2022-02-16 LAB — CREATININE, SERUM
Creatinine, Ser: 0.53 mg/dL (ref 0.44–1.00)
GFR, Estimated: >60 mL/min (ref >60)

## 2022-02-16 MED ORDER — METOPROLOL TARTRATE 25 MG PO TABS
25.0000 mg | ORAL_TABLET | Freq: Two times a day (BID) | ORAL | Status: DC
  Administered 2022-02-16: 25 mg via ORAL
  Filled 2022-02-16: qty 1

## 2022-02-16 MED ORDER — HYDROCODONE-ACETAMINOPHEN 5-325 MG PO TABS: 1.0000 | ORAL_TABLET | Freq: Four times a day (QID) | ORAL | 0 refills | Status: DC | PRN

## 2022-02-16 MED ORDER — METOPROLOL TARTRATE 25 MG PO TABS: 25.0000 mg | ORAL_TABLET | Freq: Two times a day (BID) | ORAL | 3 refills | Status: AC

## 2022-02-16 NOTE — Progress Notes (Signed)
Notified MD Dew that patient left groin site was oozing outside of PAD. Dressing reinforced. Orders given to pause aggrastat for 1hr and restart. Read back and verified.

## 2022-02-16 NOTE — Discharge Summary (Signed)
[facility]    Discharge Summary    Patient ID:  Yvonne [name] MRN: [id number] DOB/AGE: Jul 28, 1955 66 y.o.  Admit date: 02/15/2022 Discharge date: 02/16/2022 Date of Surgery: 02/15/2022 Surgeon: Surgeon(s): Lucky Cowboy Erskine Squibb, MD  Admission Diagnosis: Ischemic leg [I99.8]  Discharge Diagnoses:  Ischemic leg [I99.8]  Secondary Diagnoses: Past Medical History:  Diagnosis Date   Hyperlipidemia    Hypertension    Peripheral vascular disease ([place])    Syncope     Procedure(s): LOWER EXTREMITY INTERVENTION  Discharged Condition: good  HPI:  Yvonne [name] is a 66 year old female that presented to [facility] for right lower extremity revascularization which was successful.  Prior to her SFA occlusion the patient stopped smoking and it was felt that a large contributing factor was her markedly elevated blood pressures.  Post intervention she notes that her leg pain is much improved.   Hospital Course:  Yvonne [name] is a 66 y.o. female is S/P Right Procedure(s): LOWER EXTREMITY INTERVENTION Extubated: POD # 0 Physical exam: right foot warm, groin clean  Post-op wounds clean, dry, intact or healing well Pt. Ambulating, voiding and taking PO diet without difficulty. Pt pain controlled with PO pain meds. Labs as below Complications:none  Consults:    Significant Diagnostic Studies: CBC Lab Results  Component Value Date   WBC 8.2 02/16/2022   HGB 11.3 (L) 02/16/2022   HCT 33.7 (L) 02/16/2022   MCV 99.7 02/16/2022   PLT 163 02/16/2022    BMET    Component Value Date/Time   NA 138 02/15/2022 1841   K 3.6 02/15/2022 1841   CL 104 02/15/2022 1841   CO2 24 02/15/2022 1841   GLUCOSE 127 (H) 02/15/2022 1841   BUN 7 (L) 02/15/2022 1841   CREATININE 0.53 02/16/2022 0939   CALCIUM 8.3 (L) 02/15/2022 1841   GFRNONAA >60 02/16/2022 0939   GFRAA >60 12/02/2019 1055   COAG Lab Results  Component Value Date   INR 1.2 02/15/2022   INR 1.2 10/27/2021      Disposition:  Discharge to :Home  Allergies as of 02/16/2022   No Known Allergies      Medication List     TAKE these medications    amLODipine 10 MG tablet Commonly known as: NORVASC Take 1 tablet (10 mg total) by mouth daily.   apixaban 5 MG Tabs tablet Commonly known as: ELIQUIS Take 1 tablet (5 mg total) by mouth 2 (two) times daily.   aspirin EC 81 MG tablet Take 81 mg by mouth daily. Swallow whole.   atorvastatin 10 MG tablet Commonly known as: LIPITOR Take 10 mg by mouth daily.   cholecalciferol 25 MCG (1000 UNIT) tablet Commonly known as: VITAMIN D3 Take 1,000 Units by mouth daily.   HYDROcodone-acetaminophen 5-325 MG tablet Commonly known as: Norco Take 1 tablet by mouth every 6 (six) hours as needed for moderate pain.   metoprolol tartrate 25 MG tablet Commonly known as: LOPRESSOR Take 1 tablet (25 mg total) by mouth 2 (two) times daily. What changed: how much to take   ondansetron 4 MG tablet Commonly known as: Zofran Take 1 tablet (4 mg total) by mouth daily as needed for nausea or vomiting.       Verbal and written Discharge instructions given to the patient. Wound care per Discharge AVS  Follow-up Information     Dew, Erskine Squibb, MD Follow up.   Specialties: Vascular Surgery, Radiology, Interventional Cardiology Why: See JD or FB in 3-4  weeks with ABI Contact information: [address] [place] [address] [phone]         [facility] Schedule an appointment as soon as possible for a visit.   Why: For BP evaluation in 1-2 weeks Contact information: [address] [place] [address] [phone]                 Signed: Georgiana Spinner, NP  02/16/2022, 1:02 PM

## 2022-02-16 NOTE — Progress Notes (Signed)
[name]  Daily Progress Note   Subjective  -   Right foot feels much better  Objective Vitals:   02/16/22 0700 02/16/22 0730 02/16/22 0800 02/16/22 0829  BP: (!) 143/67 131/74 (!) 151/126 (!) 171/94  Pulse: 68 69 91 87  Resp: 12 15 (!) 23 19  Temp:    98.3 F (36.8 C)  TempSrc:    Axillary  SpO2: 94% 93% 96% 100%  Weight:      Height:        Intake/Output Summary (Last 24 hours) at 02/16/2022 0913 Last data filed at 02/16/2022 0600 Gross per 24 hour  Intake 677.86 ml  Output 1300 ml  Net -622.14 ml    PULM  CTAB CV  RRR VASC  2+ pulse DP, warm right foot (previously cool)  Laboratory CBC    Component Value Date/Time   WBC 8.2 02/16/2022 0423   HGB 11.3 (L) 02/16/2022 0423   HCT 33.7 (L) 02/16/2022 0423   PLT 163 02/16/2022 0423    BMET    Component Value Date/Time   NA 138 02/15/2022 1841   K 3.6 02/15/2022 1841   CL 104 02/15/2022 1841   CO2 24 02/15/2022 1841   GLUCOSE 127 (H) 02/15/2022 1841   BUN 7 (L) 02/15/2022 1841   CREATININE 0.44 02/15/2022 1841   CALCIUM 8.3 (L) 02/15/2022 1841   GFRNONAA >60 02/15/2022 1841   GFRAA >60 12/02/2019 1055    Assessment/Planning: POD #1 s/p right lower extremity revascularization  Right foot much warmer and patient notes that the pain is gone although still has some tingling We suspect a large reason of her occlusion was due to her extremely elevated blood pressures.  We will attempt to increase her metoprolol with the intention of gaining greater control of her blood pressure.  If it is under better control today the patient may be able to discharge today, possibly Saturday.  Yvonne [name]  02/16/2022, 9:13 AM

## 2022-02-16 NOTE — Progress Notes (Signed)
Reviewed d/c instructions with pt and son with pt's permission, pivs d/c,pressure dressings applied, pt dressed self, no distress noted.

## 2022-02-18 DIAGNOSIS — I70223 Atherosclerosis of native arteries of extremities with rest pain, bilateral legs: Secondary | ICD-10-CM | POA: Diagnosis not present

## 2022-02-21 ENCOUNTER — Encounter: Payer: Self-pay | Admitting: Vascular Surgery

## 2022-02-26 ENCOUNTER — Other Ambulatory Visit (INDEPENDENT_AMBULATORY_CARE_PROVIDER_SITE_OTHER): Payer: Self-pay | Admitting: Vascular Surgery

## 2022-02-26 ENCOUNTER — Telehealth (INDEPENDENT_AMBULATORY_CARE_PROVIDER_SITE_OTHER): Payer: Self-pay | Admitting: Nurse Practitioner

## 2022-02-26 DIAGNOSIS — I739 Peripheral vascular disease, unspecified: Secondary | ICD-10-CM

## 2022-02-26 NOTE — Telephone Encounter (Signed)
Patient called and LVM stating that she still has no relief with her feet. She is still without circulation as well as her feet are cold     Please call and advise

## 2022-02-26 NOTE — Telephone Encounter (Signed)
Patient informed that her right foot feels like a block of ice and at pain level of 9. Patient has been having symptoms for past 2 days.

## 2022-02-26 NOTE — Telephone Encounter (Signed)
Please try to reach granddaughter, we updated it at her last office visit

## 2022-02-26 NOTE — Telephone Encounter (Signed)
ATC patient to schedule unable to reach LVM and unable to reach patient

## 2022-02-27 ENCOUNTER — Ambulatory Visit (INDEPENDENT_AMBULATORY_CARE_PROVIDER_SITE_OTHER): Payer: Medicare Other

## 2022-02-27 ENCOUNTER — Telehealth (INDEPENDENT_AMBULATORY_CARE_PROVIDER_SITE_OTHER): Payer: Self-pay

## 2022-02-27 ENCOUNTER — Ambulatory Visit (INDEPENDENT_AMBULATORY_CARE_PROVIDER_SITE_OTHER): Payer: Medicare Other | Admitting: Vascular Surgery

## 2022-02-27 ENCOUNTER — Encounter (INDEPENDENT_AMBULATORY_CARE_PROVIDER_SITE_OTHER): Payer: Self-pay | Admitting: Vascular Surgery

## 2022-02-27 ENCOUNTER — Other Ambulatory Visit (INDEPENDENT_AMBULATORY_CARE_PROVIDER_SITE_OTHER): Payer: Self-pay | Admitting: Vascular Surgery

## 2022-02-27 VITALS — BP 176/81 | HR 80 | Resp 16 | Wt 106.0 lb

## 2022-02-27 DIAGNOSIS — Z9889 Other specified postprocedural states: Secondary | ICD-10-CM

## 2022-02-27 DIAGNOSIS — I70249 Atherosclerosis of native arteries of left leg with ulceration of unspecified site: Secondary | ICD-10-CM | POA: Diagnosis not present

## 2022-02-27 DIAGNOSIS — I10 Essential (primary) hypertension: Secondary | ICD-10-CM

## 2022-02-27 DIAGNOSIS — M79604 Pain in right leg: Secondary | ICD-10-CM | POA: Diagnosis not present

## 2022-02-27 DIAGNOSIS — I70229 Atherosclerosis of native arteries of extremities with rest pain, unspecified extremity: Secondary | ICD-10-CM | POA: Diagnosis not present

## 2022-02-27 DIAGNOSIS — I739 Peripheral vascular disease, unspecified: Secondary | ICD-10-CM | POA: Diagnosis not present

## 2022-02-27 DIAGNOSIS — E782 Mixed hyperlipidemia: Secondary | ICD-10-CM

## 2022-02-27 DIAGNOSIS — I998 Other disorder of circulatory system: Secondary | ICD-10-CM | POA: Diagnosis not present

## 2022-02-27 DIAGNOSIS — I70239 Atherosclerosis of native arteries of right leg with ulceration of unspecified site: Secondary | ICD-10-CM | POA: Diagnosis not present

## 2022-02-27 NOTE — Progress Notes (Signed)
  MRN : [id number]  Yvonne [name] is a 65 y.o. (02/15/1956) female who presents with chief complaint of  Chief Complaint  Patient presents with   Follow-up    Vein mapping for right cold and painful  .  History of Present Illness: Patient returns today in follow up of her PAD. She has been having rest pain again for a few days. Her right foot is terribly painful. She says the pain is just like when she has had occlusions before.  Noninvasive studies today show a recurrent right SFA and popliteal occlusion.  Her distal perfusion is poor.  We did a vein mapping to evaluate for a fem peroneal bypass.  Unfortunately, her saphenous veins are very small and largely below 2 mm in diameter and not really usable for bypass.    Current Outpatient Medications  Medication Sig Dispense Refill   amLODipine (NORVASC) 10 MG tablet Take 1 tablet (10 mg total) by mouth daily. 30 tablet 0   apixaban (ELIQUIS) 5 MG TABS tablet Take 1 tablet (5 mg total) by mouth 2 (two) times daily. 60 tablet 0   aspirin EC 81 MG tablet Take 81 mg by mouth daily. Swallow whole.     atorvastatin (LIPITOR) 10 MG tablet Take 10 mg by mouth daily.     cholecalciferol (VITAMIN D3) 25 MCG (1000 UNIT) tablet Take 1,000 Units by mouth daily.     HYDROcodone-acetaminophen (NORCO) 5-325 MG tablet Take 1 tablet by mouth every 6 (six) hours as needed for moderate pain. 30 tablet 0   metoprolol tartrate (LOPRESSOR) 25 MG tablet Take 1 tablet (25 mg total) by mouth 2 (two) times daily. 60 tablet 3   ondansetron (ZOFRAN) 4 MG tablet Take 1 tablet (4 mg total) by mouth daily as needed for nausea or vomiting. (Patient not taking: Reported on 10/27/2021) 15 tablet 0   No current facility-administered medications for this visit.    Past Medical History:  Diagnosis Date   Hyperlipidemia    Hypertension    Peripheral vascular disease (HCC)    Syncope     Past Surgical History:  Procedure Laterality Date   LOWER EXTREMITY ANGIOGRAPHY Right  10/02/2021   Procedure: Lower Extremity Angiography;  Surgeon: Johnanna Bakke S, MD;  Location: [facility];  Service: Cardiovascular;  Laterality: Right;   LOWER EXTREMITY ANGIOGRAPHY Left 10/09/2021   Procedure: Lower Extremity Angiography;  Surgeon: Kalah Pflum S, MD;  Location: [facility];  Service: Cardiovascular;  Laterality: Left;   LOWER EXTREMITY ANGIOGRAPHY Right 10/27/2021   Procedure: Lower Extremity Angiography;  Surgeon: Courteney Alderete S, MD;  Location: [facility];  Service: Cardiovascular;  Laterality: Right;   LOWER EXTREMITY ANGIOGRAPHY Right 02/15/2022   Procedure: Lower Extremity Angiography;  Surgeon: Jeri Jeanbaptiste S, MD;  Location: [facility];  Service: Cardiovascular;  Laterality: Right;   LOWER EXTREMITY INTERVENTION Right 10/27/2021   Procedure: LOWER EXTREMITY INTERVENTION;  Surgeon: Rayven Rettig S, MD;  Location: [facility];  Service: Cardiovascular;  Laterality: Right;   LOWER EXTREMITY INTERVENTION Right 02/15/2022   Procedure: LOWER EXTREMITY INTERVENTION;  Surgeon: Arti Trang S, MD;  Location: [facility];  Service: Cardiovascular;  Laterality: Right;   PARTIAL HYSTERECTOMY       Social History   Tobacco Use   Smoking status: Former    Packs/day: 1.00    Types: Cigarettes    Quit date: 10/27/2021    Years since quitting: 0.3   Smokeless   tobacco: Never  Substance Use Topics   Alcohol use: Yes    Alcohol/week: 3.0 standard drinks of alcohol    Types: 3 Glasses of wine per week   Drug use: Never       Family History No bleeding disorders, clotting disorders, autoimmune disease or aneurysm   No Known Allergies   REVIEW OF SYSTEMS (Negative unless checked)  Constitutional: [] Weight loss  [] Fever  [] Chills Cardiac: [] Chest pain   [] Chest pressure   [] Palpitations   [] Shortness of breath when laying flat   [] Shortness of breath at rest   [] Shortness of breath with exertion. Vascular:  [] Pain in legs with walking    [] Pain in legs at rest   [] Pain in legs when laying flat   [] Claudication   [] Pain in feet when walking  [] Pain in feet at rest  [] Pain in feet when laying flat   [] History of DVT   [] Phlebitis   [] Swelling in legs   [] Varicose veins   [] Non-healing ulcers Pulmonary:   [] Uses home oxygen   [] Productive cough   [] Hemoptysis   [] Wheeze  [] COPD   [] Asthma Neurologic:  [x] Dizziness  [] Blackouts   [] Seizures   [] History of stroke   [] History of TIA  [] Aphasia   [] Temporary blindness   [] Dysphagia   [] Weakness or numbness in arms   [] Weakness or numbness in legs Musculoskeletal:  [] Arthritis   [] Joint swelling   [] Joint pain   [] Low back pain Hematologic:  [] Easy bruising  [] Easy bleeding   [x] Hypercoagulable state   [] Anemic   Gastrointestinal:  [] Blood in stool   [] Vomiting blood  [x] Gastroesophageal reflux/heartburn   [] Abdominal pain Genitourinary:  [] Chronic kidney disease   [] Difficult urination  [] Frequent urination  [] Burning with urination   [] Hematuria Skin:  [] Rashes   [] Ulcers   [] Wounds Psychological:  [] History of anxiety   []  History of major depression.  Physical Examination  BP (!) 176/81 (BP Location: Left Arm)   Pulse 80   Resp 16   Wt 106 lb (48.1 kg)   BMI 18.78 kg/m  Gen:  WD/WN, NAD Head: [place]/AT, No temporalis wasting. Ear/Nose/Throat: Hearing grossly intact, nares w/o erythema or drainage Eyes: Conjunctiva clear. Sclera non-icteric Neck: Supple.  Trachea midline Pulmonary:  Good air movement, no use of accessory muscles.  Cardiac: RRR, no JVD Vascular:  Vessel Right Left  Radial Palpable Palpable                          PT Not Palpable 1+ Palpable  DP Not Palpable 1+ Palpable   Musculoskeletal: M/S 5/5 throughout.  No deformity or atrophy. No edema. Neurologic: Sensation grossly intact in extremities.  Symmetrical.  Speech is fluent.  Psychiatric: Judgment intact, Mood & affect appropriate for pt's clinical situation. Dermatologic: No rashes or ulcers noted.   No cellulitis or open wounds.      Labs Recent Results (from the past 2160 hour(s))  BUN     Status: None   Collection Time: 02/15/22  7:11 AM  Result Value Ref Range   BUN 12 8 - 23 mg/dL    Comment: Performed at [facility], [address] [place], [place] [address]  Creatinine, serum     Status: Abnormal   Collection Time: 02/15/22  7:11 AM  Result Value Ref Range   Creatinine, Ser 0.43 (L) 0.44 - 1.00 mg/dL   GFR, Estimated >60 >60 mL/min    Comment: (NOTE) Calculated using the CKD-EPI Creatinine Equation (  2021) Performed at [facility], [address] [place], [place] [address]   Glucose, capillary     Status: Abnormal   Collection Time: 02/15/22 11:02 AM  Result Value Ref Range   Glucose-Capillary 104 (H) 70 - 99 mg/dL    Comment: Glucose reference range applies only to samples taken after fasting for at least 8 hours.  CBC     Status: Abnormal   Collection Time: 02/15/22 12:24 PM  Result Value Ref Range   WBC 6.6 4.0 - 10.5 K/uL   RBC 3.79 (L) 3.87 - 5.11 MIL/uL   Hemoglobin 12.6 12.0 - 15.0 g/dL   HCT 36.9 36.0 - 46.0 %   MCV 97.4 80.0 - 100.0 fL   MCH 33.2 26.0 - 34.0 pg   MCHC 34.1 30.0 - 36.0 g/dL   RDW 13.0 11.5 - 15.5 %   Platelets 189 150 - 400 K/uL   nRBC 0.0 0.0 - 0.2 %    Comment: Performed at [facility], [address] [place], [place] [address]  Comprehensive metabolic panel     Status: Abnormal   Collection Time: 02/15/22 12:24 PM  Result Value Ref Range   Sodium 139 135 - 145 mmol/L   Potassium 3.4 (L) 3.5 - 5.1 mmol/L   Chloride 105 98 - 111 mmol/L   CO2 24 22 - 32 mmol/L   Glucose, Bld 134 (H) 70 - 99 mg/dL    Comment: Glucose reference range applies only to samples taken after fasting for at least 8 hours.   BUN 9 8 - 23 mg/dL   Creatinine, Ser 0.46 0.44 - 1.00 mg/dL   Calcium 8.7 (L) 8.9 - 10.3 mg/dL   Total Protein 7.1 6.5 - 8.1 g/dL   Albumin 3.8 3.5 - 5.0 g/dL   AST 142 (H) 15 - 41 U/L   ALT 117  (H) 0 - 44 U/L   Alkaline Phosphatase 127 (H) 38 - 126 U/L   Total Bilirubin 0.8 0.3 - 1.2 mg/dL   GFR, Estimated >60 >60 mL/min    Comment: (NOTE) Calculated using the CKD-EPI Creatinine Equation (2021)    Anion gap 10 5 - 15    Comment: Performed at [facility], [address] [place], [place] [address]  Protime-INR     Status: None   Collection Time: 02/15/22 12:24 PM  Result Value Ref Range   Prothrombin Time 15.2 11.4 - 15.2 seconds   INR 1.2 0.8 - 1.2    Comment: (NOTE) INR goal varies based on device and disease states. Performed at [facility], [address] [place], [place] [id number]   Basic metabolic panel     Status: Abnormal   Collection Time: 02/15/22  6:41 PM  Result Value Ref Range   Sodium 138 135 - 145 mmol/L   Potassium 3.6 3.5 - 5.1 mmol/L   Chloride 104 98 - 111 mmol/L   CO2 24 22 - 32 mmol/L   Glucose, Bld 127 (H) 70 - 99 mg/dL    Comment: Glucose reference range applies only to samples taken after fasting for at least 8 hours.   BUN 7 (L) 8 - 23 mg/dL   Creatinine, Ser 0.44 0.44 - 1.00 mg/dL   Calcium 8.3 (L) 8.9 - 10.3 mg/dL   GFR, Estimated >60 >60 mL/min    Comment: (NOTE) Calculated using the CKD-EPI Creatinine Equation (2021)    Anion gap 10 5 - 15    Comment: Performed at [facility],  [place], [place] [address]  CBC     Status: Abnormal   Collection Time: 02/16/22  4:23 AM  Result Value Ref Range   WBC 8.2 4.0 - 10.5 K/uL   RBC 3.38 (L) 3.87 - 5.11 MIL/uL   Hemoglobin 11.3 (L) 12.0 - 15.0 g/dL   HCT 33.7 (L) 36.0 - 46.0 %   MCV 99.7 80.0 - 100.0 fL   MCH 33.4 26.0 - 34.0 pg   MCHC 33.5 30.0 - 36.0 g/dL   RDW 13.1 11.5 - 15.5 %   Platelets 163 150 - 400 K/uL   nRBC 0.0 0.0 - 0.2 %    Comment: Performed at [facility], [address] [place], [place] [address]  Creatinine, serum     Status: None   Collection Time: 02/16/22  9:39 AM  Result Value Ref Range   Creatinine, Ser  0.53 0.44 - 1.00 mg/dL   GFR, Estimated >60 >60 mL/min    Comment: (NOTE) Calculated using the CKD-EPI Creatinine Equation (2021) Performed at [facility], [address], [place], [place] [address]     Radiology VAS [place] LOWER EXTREMITY ARTERIAL DUPLEX  Result Date: 02/16/2022 [place] STUDY Patient Name:  OCTOBER MATLEY  Date of Exam:   02/13/2022 Medical Rec #: [id number]    Accession #:    [id number] Date of Birth: September 14, 1955   Patient Gender: F Patient Age:   81 years Exam Location:  [facility] Procedure:      VAS [place] LOWER EXTREMITY ARTERIAL DUPLEX Referring Phys: Eulogio Ditch --------------------------------------------------------------------------------  Indications: Rest pain, and peripheral artery disease. High Risk Factors: Hypertension, hyperlipidemia, past history of smoking.  Vascular Interventions: 10/27/2021 rt thrombectomy plus new poplitealstent                         10/02/21: Right SFA/popliteal stent with TP trunk/peroneal                         PTAs;                         10/09/21: Left SFA stent with popliteal/TP                         trunk//ATA/peroneal PTAs;. Current ABI:            Right: 0.17, Left: 0.77 Performing Technologist: Delorise Shiner RVT  Examination Guidelines: A complete evaluation includes B-mode imaging, spectral Doppler, color Doppler, and power Doppler as needed of all accessible portions of each vessel. Bilateral testing is considered an integral part of a complete examination. Limited examinations for reoccurring indications may be performed as noted.  +----------+--------+-----+--------+----------+--------+ RIGHT     PSV cm/sRatioStenosisWaveform  Comments +----------+--------+-----+--------+----------+--------+ CFA Distal132                  triphasic          +----------+--------+-----+--------+----------+--------+ DFA       73                   triphasic           +----------+--------+-----+--------+----------+--------+ SFA Prox  0            occluded                   +----------+--------+-----+--------+----------+--------+ SFA Mid   0  occluded                   +----------+--------+-----+--------+----------+--------+ SFA Distal0            occluded                   +----------+--------+-----+--------+----------+--------+ POP Prox  0            occluded                   +----------+--------+-----+--------+----------+--------+ POP Distal53                   monophasic         +----------+--------+-----+--------+----------+--------+  Right Stent(s): +---------------+--------+--------+----------+--------+ SFA / Pop      PSV cm/sStenosisWaveform  Comments +---------------+--------+--------+----------+--------+ Prox to Stent  0       occluded                   +---------------+--------+--------+----------+--------+ Proximal Stent 0       occluded                   +---------------+--------+--------+----------+--------+ Mid Stent      0       occluded                   +---------------+--------+--------+----------+--------+ Distal Stent   0       occluded                   +---------------+--------+--------+----------+--------+ Distal to Stent53              monophasic         +---------------+--------+--------+----------+--------+    Summary: Right: Stenosis is noted within the SFA to popliteal stent. Occluded stent.  See table(s) above for measurements and observations. Electronically signed by [name] MD on 02/16/2022 at 10:59:58 AM.    Final    VAS [place] ABI WITH/WO TBI  Result Date: 02/16/2022  LOWER EXTREMITY DOPPLER STUDY Patient Name:  UNNAMED HINO  Date of Exam:   02/13/2022 Medical Rec #: [id number]    Accession #:    [id number] Date of Birth: January 15, 1956   Patient Gender: F Patient Age:   71 years Exam Location:  [facility] Procedure:      VAS [place] ABI WITH/WO TBI Referring  Phys: Eulogio Ditch --------------------------------------------------------------------------------  Indications: Rest pain, and peripheral artery disease. High Risk Factors: Hypertension, hyperlipidemia, past history of smoking.  Vascular Interventions: 10/27/2021 rt thrombectomy plus new poplitealstent                         10/02/21: Right SFA/popliteal stent with TP trunk/peroneal                         PTAs;                         10/09/21: Left SFA stent with popliteal/TP                         trunk//ATA/peroneal PTAs;. Performing Technologist: Delorise Shiner RVT  Examination Guidelines: A complete evaluation includes at minimum, Doppler waveform signals and systolic blood pressure reading at the level of bilateral brachial, anterior tibial, and posterior tibial arteries, when vessel segments are accessible. Bilateral testing is considered an integral part of a complete examination. Photoelectric Plethysmograph (PPG) waveforms and toe  systolic pressure readings are included as required and additional duplex testing as needed. Limited examinations for reoccurring indications may be performed as noted.  ABI Findings: +---------+------------------+-----+----------+--------+ Right    Rt Pressure (mmHg)IndexWaveform  Comment  +---------+------------------+-----+----------+--------+ Brachial 223                                       +---------+------------------+-----+----------+--------+ ATA      38                0.17 monophasic         +---------+------------------+-----+----------+--------+ PTA      0                 0.00 absent             +---------+------------------+-----+----------+--------+ PERO     0                 0.00 absent             +---------+------------------+-----+----------+--------+ Great Toe19                0.08                    +---------+------------------+-----+----------+--------+ +---------+------------------+-----+----------+-------+ Left     Lt  Pressure (mmHg)IndexWaveform  Comment +---------+------------------+-----+----------+-------+ Brachial 226                                      +---------+------------------+-----+----------+-------+ ATA      164               0.73 biphasic          +---------+------------------+-----+----------+-------+ PTA      143               0.63 monophasic        +---------+------------------+-----+----------+-------+ Great Toe92                0.41                   +---------+------------------+-----+----------+-------+ +-------+-----------+-----------+------------+------------+ ABI/TBIToday's ABIToday's TBIPrevious ABIPrevious TBI +-------+-----------+-----------+------------+------------+ Right  0.17       0.08       0.96        0.58         +-------+-----------+-----------+------------+------------+ Left   0.73       0.41       0.86        0.89         +-------+-----------+-----------+------------+------------+ Bilateral ABIs appear decreased compared to prior study on 11/15/2021.  Summary: Right: Resting right ankle-brachial index indicates critical limb ischemia. The right toe-brachial index is abnormal. Left: Resting left ankle-brachial index indicates moderate left lower extremity arterial disease. The left toe-brachial index is abnormal. *See table(s) above for measurements and observations.  Electronically signed by [name] MD on 02/16/2022 at 10:59:48 AM.    Final    PERIPHERAL VASCULAR CATHETERIZATION  Result Date: 02/15/2022 See surgical note for result.  PERIPHERAL VASCULAR CATHETERIZATION  Result Date: 02/15/2022 See surgical note for result.   Assessment/Plan  Benign essential HTN blood pressure control important in reducing the progression of atherosclerotic disease. On appropriate oral medications.   Hyperlipidemia, mixed lipid control important in reducing the progression of atherosclerotic disease. Continue statin therapy   Ischemic  leg Noninvasive studies today show a  recurrent right SFA and popliteal occlusion.  Her distal perfusion is poor.  We did a vein mapping to evaluate for a fem peroneal bypass.  Unfortunately, her saphenous veins are very small and largely below 2 mm in diameter and not really usable for bypass.   This is a very difficult situation.  She is tearful today and we discussed with her and her son that there is really not a great option in this situation.  I am happy to try to revascularize her again and maybe try different anticoagulant, but I am highly concerned about the durability with her very severe disease and very small vessels.  Bypass with a prosthetic graft is not going to be durable and is really not worth the time or recovery that it would take for her and her saphenous vein is not usable for bypass so that is not really an option.  She would like [place] to perform another angiogram with revascularization which I think is very reasonable.  She understands that amputation of this right leg is significantly likely in the near future.    [name], MD  02/27/2022 11:25 AM    This note was created with Dragon medical transcription system.  Any errors from dictation are purely unintentional

## 2022-02-27 NOTE — H&P (View-Only) (Signed)
MRN : [id number]  Yvonne [name] is a 66 y.o. (01-21-1956) female who presents with chief complaint of  Chief Complaint  Patient presents with   Follow-up    Vein mapping for right cold and painful  .  History of Present Illness: Patient returns today in follow up of her PAD. She has been having rest pain again for a few days. Her right foot is terribly painful. She says the pain is just like when she has had occlusions before.  Noninvasive studies today show a recurrent right SFA and popliteal occlusion.  Her distal perfusion is poor.  We did a vein mapping to evaluate for a fem peroneal bypass.  Unfortunately, her saphenous veins are very small and largely below 2 mm in diameter and not really usable for bypass.    Current Outpatient Medications  Medication Sig Dispense Refill   amLODipine (NORVASC) 10 MG tablet Take 1 tablet (10 mg total) by mouth daily. 30 tablet 0   apixaban (ELIQUIS) 5 MG TABS tablet Take 1 tablet (5 mg total) by mouth 2 (two) times daily. 60 tablet 0   aspirin EC 81 MG tablet Take 81 mg by mouth daily. Swallow whole.     atorvastatin (LIPITOR) 10 MG tablet Take 10 mg by mouth daily.     cholecalciferol (VITAMIN D3) 25 MCG (1000 UNIT) tablet Take 1,000 Units by mouth daily.     HYDROcodone-acetaminophen (NORCO) 5-325 MG tablet Take 1 tablet by mouth every 6 (six) hours as needed for moderate pain. 30 tablet 0   metoprolol tartrate (LOPRESSOR) 25 MG tablet Take 1 tablet (25 mg total) by mouth 2 (two) times daily. 60 tablet 3   ondansetron (ZOFRAN) 4 MG tablet Take 1 tablet (4 mg total) by mouth daily as needed for nausea or vomiting. (Patient not taking: Reported on 10/27/2021) 15 tablet 0   No current facility-administered medications for this visit.    Past Medical History:  Diagnosis Date   Hyperlipidemia    Hypertension    Peripheral vascular disease (HCC)    Syncope     Past Surgical History:  Procedure Laterality Date   LOWER EXTREMITY ANGIOGRAPHY Right  10/02/2021   Procedure: Lower Extremity Angiography;  Surgeon: Annice Needy, MD;  Location: [facility];  Service: Cardiovascular;  Laterality: Right;   LOWER EXTREMITY ANGIOGRAPHY Left 10/09/2021   Procedure: Lower Extremity Angiography;  Surgeon: Annice Needy, MD;  Location: [facility];  Service: Cardiovascular;  Laterality: Left;   LOWER EXTREMITY ANGIOGRAPHY Right 10/27/2021   Procedure: Lower Extremity Angiography;  Surgeon: Annice Needy, MD;  Location: [facility];  Service: Cardiovascular;  Laterality: Right;   LOWER EXTREMITY ANGIOGRAPHY Right 02/15/2022   Procedure: Lower Extremity Angiography;  Surgeon: Annice Needy, MD;  Location: [facility];  Service: Cardiovascular;  Laterality: Right;   LOWER EXTREMITY INTERVENTION Right 10/27/2021   Procedure: LOWER EXTREMITY INTERVENTION;  Surgeon: Annice Needy, MD;  Location: [facility];  Service: Cardiovascular;  Laterality: Right;   LOWER EXTREMITY INTERVENTION Right 02/15/2022   Procedure: LOWER EXTREMITY INTERVENTION;  Surgeon: Annice Needy, MD;  Location: [facility];  Service: Cardiovascular;  Laterality: Right;   PARTIAL HYSTERECTOMY       Social History   Tobacco Use   Smoking status: Former    Packs/day: 1.00    Types: Cigarettes    Quit date: 10/27/2021    Years since quitting: 0.3   Smokeless  tobacco: Never  Substance Use Topics   Alcohol use: Yes    Alcohol/week: 3.0 standard drinks of alcohol    Types: 3 Glasses of wine per week   Drug use: Never       Family History No bleeding disorders, clotting disorders, autoimmune disease or aneurysm   No Known Allergies   REVIEW OF SYSTEMS (Negative unless checked)  Constitutional: [] Weight loss  [] Fever  [] Chills Cardiac: [] Chest pain   [] Chest pressure   [] Palpitations   [] Shortness of breath when laying flat   [] Shortness of breath at rest   [] Shortness of breath with exertion. Vascular:  [] Pain in legs with walking    [] Pain in legs at rest   [] Pain in legs when laying flat   [] Claudication   [] Pain in feet when walking  [] Pain in feet at rest  [] Pain in feet when laying flat   [] History of DVT   [] Phlebitis   [] Swelling in legs   [] Varicose veins   [] Non-healing ulcers Pulmonary:   [] Uses home oxygen   [] Productive cough   [] Hemoptysis   [] Wheeze  [] COPD   [] Asthma Neurologic:  [x] Dizziness  [] Blackouts   [] Seizures   [] History of stroke   [] History of TIA  [] Aphasia   [] Temporary blindness   [] Dysphagia   [] Weakness or numbness in arms   [] Weakness or numbness in legs Musculoskeletal:  [] Arthritis   [] Joint swelling   [] Joint pain   [] Low back pain Hematologic:  [] Easy bruising  [] Easy bleeding   [x] Hypercoagulable state   [] Anemic   Gastrointestinal:  [] Blood in stool   [] Vomiting blood  [x] Gastroesophageal reflux/heartburn   [] Abdominal pain Genitourinary:  [] Chronic kidney disease   [] Difficult urination  [] Frequent urination  [] Burning with urination   [] Hematuria Skin:  [] Rashes   [] Ulcers   [] Wounds Psychological:  [] History of anxiety   []  History of major depression.  Physical Examination  BP (!) 176/81 (BP Location: Left Arm)   Pulse 80   Resp 16   Wt 106 lb (48.1 kg)   BMI 18.78 kg/m  Gen:  WD/WN, NAD Head: [place]/AT, No temporalis wasting. Ear/Nose/Throat: Hearing grossly intact, nares w/o erythema or drainage Eyes: Conjunctiva clear. Sclera non-icteric Neck: Supple.  Trachea midline Pulmonary:  Good air movement, no use of accessory muscles.  Cardiac: RRR, no JVD Vascular:  Vessel Right Left  Radial Palpable Palpable                          PT Not Palpable 1+ Palpable  DP Not Palpable 1+ Palpable   Musculoskeletal: M/S 5/5 throughout.  No deformity or atrophy. No edema. Neurologic: Sensation grossly intact in extremities.  Symmetrical.  Speech is fluent.  Psychiatric: Judgment intact, Mood & affect appropriate for pt's clinical situation. Dermatologic: No rashes or ulcers noted.   No cellulitis or open wounds.      Labs Recent Results (from the past 2160 hour(s))  BUN     Status: None   Collection Time: 02/15/22  7:11 AM  Result Value Ref Range   BUN 12 8 - 23 mg/dL    Comment: Performed at [facility], [address] [place], [place] [address]  Creatinine, serum     Status: Abnormal   Collection Time: 02/15/22  7:11 AM  Result Value Ref Range   Creatinine, Ser 0.43 (L) 0.44 - 1.00 mg/dL   GFR, Estimated >60 >60 mL/min    Comment: (NOTE) Calculated using the CKD-EPI Creatinine Equation (  2021) Performed at [facility], [address] [place], [place] [address]   Glucose, capillary     Status: Abnormal   Collection Time: 02/15/22 11:02 AM  Result Value Ref Range   Glucose-Capillary 104 (H) 70 - 99 mg/dL    Comment: Glucose reference range applies only to samples taken after fasting for at least 8 hours.  CBC     Status: Abnormal   Collection Time: 02/15/22 12:24 PM  Result Value Ref Range   WBC 6.6 4.0 - 10.5 K/uL   RBC 3.79 (L) 3.87 - 5.11 MIL/uL   Hemoglobin 12.6 12.0 - 15.0 g/dL   HCT 36.9 36.0 - 46.0 %   MCV 97.4 80.0 - 100.0 fL   MCH 33.2 26.0 - 34.0 pg   MCHC 34.1 30.0 - 36.0 g/dL   RDW 13.0 11.5 - 15.5 %   Platelets 189 150 - 400 K/uL   nRBC 0.0 0.0 - 0.2 %    Comment: Performed at [facility], [address] [place], [place] [address]  Comprehensive metabolic panel     Status: Abnormal   Collection Time: 02/15/22 12:24 PM  Result Value Ref Range   Sodium 139 135 - 145 mmol/L   Potassium 3.4 (L) 3.5 - 5.1 mmol/L   Chloride 105 98 - 111 mmol/L   CO2 24 22 - 32 mmol/L   Glucose, Bld 134 (H) 70 - 99 mg/dL    Comment: Glucose reference range applies only to samples taken after fasting for at least 8 hours.   BUN 9 8 - 23 mg/dL   Creatinine, Ser 0.46 0.44 - 1.00 mg/dL   Calcium 8.7 (L) 8.9 - 10.3 mg/dL   Total Protein 7.1 6.5 - 8.1 g/dL   Albumin 3.8 3.5 - 5.0 g/dL   AST 142 (H) 15 - 41 U/L   ALT 117  (H) 0 - 44 U/L   Alkaline Phosphatase 127 (H) 38 - 126 U/L   Total Bilirubin 0.8 0.3 - 1.2 mg/dL   GFR, Estimated >60 >60 mL/min    Comment: (NOTE) Calculated using the CKD-EPI Creatinine Equation (2021)    Anion gap 10 5 - 15    Comment: Performed at [facility], [address] [place], [place] [address]  Protime-INR     Status: None   Collection Time: 02/15/22 12:24 PM  Result Value Ref Range   Prothrombin Time 15.2 11.4 - 15.2 seconds   INR 1.2 0.8 - 1.2    Comment: (NOTE) INR goal varies based on device and disease states. Performed at [facility], [address] [place], [place] [id number]   Basic metabolic panel     Status: Abnormal   Collection Time: 02/15/22  6:41 PM  Result Value Ref Range   Sodium 138 135 - 145 mmol/L   Potassium 3.6 3.5 - 5.1 mmol/L   Chloride 104 98 - 111 mmol/L   CO2 24 22 - 32 mmol/L   Glucose, Bld 127 (H) 70 - 99 mg/dL    Comment: Glucose reference range applies only to samples taken after fasting for at least 8 hours.   BUN 7 (L) 8 - 23 mg/dL   Creatinine, Ser 0.44 0.44 - 1.00 mg/dL   Calcium 8.3 (L) 8.9 - 10.3 mg/dL   GFR, Estimated >60 >60 mL/min    Comment: (NOTE) Calculated using the CKD-EPI Creatinine Equation (2021)    Anion gap 10 5 - 15    Comment: Performed at [facility],  [place], [place] [address]  CBC     Status: Abnormal   Collection Time: 02/16/22  4:23 AM  Result Value Ref Range   WBC 8.2 4.0 - 10.5 K/uL   RBC 3.38 (L) 3.87 - 5.11 MIL/uL   Hemoglobin 11.3 (L) 12.0 - 15.0 g/dL   HCT 33.7 (L) 36.0 - 46.0 %   MCV 99.7 80.0 - 100.0 fL   MCH 33.4 26.0 - 34.0 pg   MCHC 33.5 30.0 - 36.0 g/dL   RDW 13.1 11.5 - 15.5 %   Platelets 163 150 - 400 K/uL   nRBC 0.0 0.0 - 0.2 %    Comment: Performed at [facility], [address] [place], [place] [address]  Creatinine, serum     Status: None   Collection Time: 02/16/22  9:39 AM  Result Value Ref Range   Creatinine, Ser  0.53 0.44 - 1.00 mg/dL   GFR, Estimated >60 >60 mL/min    Comment: (NOTE) Calculated using the CKD-EPI Creatinine Equation (2021) Performed at [facility], [address], [place], [place] [address]     Radiology VAS [place] LOWER EXTREMITY ARTERIAL DUPLEX  Result Date: 02/16/2022 [place] STUDY Patient Name:  OCTOBER MATLEY  Date of Exam:   02/13/2022 Medical Rec #: [id number]    Accession #:    [id number] Date of Birth: September 14, 1955   Patient Gender: F Patient Age:   81 years Exam Location:  [facility] Procedure:      VAS [place] LOWER EXTREMITY ARTERIAL DUPLEX Referring Phys: Eulogio Ditch --------------------------------------------------------------------------------  Indications: Rest pain, and peripheral artery disease. High Risk Factors: Hypertension, hyperlipidemia, past history of smoking.  Vascular Interventions: 10/27/2021 rt thrombectomy plus new poplitealstent                         10/02/21: Right SFA/popliteal stent with TP trunk/peroneal                         PTAs;                         10/09/21: Left SFA stent with popliteal/TP                         trunk//ATA/peroneal PTAs;. Current ABI:            Right: 0.17, Left: 0.77 Performing Technologist: Delorise Shiner RVT  Examination Guidelines: A complete evaluation includes B-mode imaging, spectral Doppler, color Doppler, and power Doppler as needed of all accessible portions of each vessel. Bilateral testing is considered an integral part of a complete examination. Limited examinations for reoccurring indications may be performed as noted.  +----------+--------+-----+--------+----------+--------+ RIGHT     PSV cm/sRatioStenosisWaveform  Comments +----------+--------+-----+--------+----------+--------+ CFA Distal132                  triphasic          +----------+--------+-----+--------+----------+--------+ DFA       73                   triphasic           +----------+--------+-----+--------+----------+--------+ SFA Prox  0            occluded                   +----------+--------+-----+--------+----------+--------+ SFA Mid   0  occluded                   +----------+--------+-----+--------+----------+--------+ SFA Distal0            occluded                   +----------+--------+-----+--------+----------+--------+ POP Prox  0            occluded                   +----------+--------+-----+--------+----------+--------+ POP Distal53                   monophasic         +----------+--------+-----+--------+----------+--------+  Right Stent(s): +---------------+--------+--------+----------+--------+ SFA / Pop      PSV cm/sStenosisWaveform  Comments +---------------+--------+--------+----------+--------+ Prox to Stent  0       occluded                   +---------------+--------+--------+----------+--------+ Proximal Stent 0       occluded                   +---------------+--------+--------+----------+--------+ Mid Stent      0       occluded                   +---------------+--------+--------+----------+--------+ Distal Stent   0       occluded                   +---------------+--------+--------+----------+--------+ Distal to Stent53              monophasic         +---------------+--------+--------+----------+--------+    Summary: Right: Stenosis is noted within the SFA to popliteal stent. Occluded stent.  See table(s) above for measurements and observations. Electronically signed by [name] MD on 02/16/2022 at 10:59:58 AM.    Final    VAS [place] ABI WITH/WO TBI  Result Date: 02/16/2022  LOWER EXTREMITY DOPPLER STUDY Patient Name:  UNNAMED HINO  Date of Exam:   02/13/2022 Medical Rec #: [id number]    Accession #:    [id number] Date of Birth: January 15, 1956   Patient Gender: F Patient Age:   71 years Exam Location:  [facility] Procedure:      VAS [place] ABI WITH/WO TBI Referring  Phys: Eulogio Ditch --------------------------------------------------------------------------------  Indications: Rest pain, and peripheral artery disease. High Risk Factors: Hypertension, hyperlipidemia, past history of smoking.  Vascular Interventions: 10/27/2021 rt thrombectomy plus new poplitealstent                         10/02/21: Right SFA/popliteal stent with TP trunk/peroneal                         PTAs;                         10/09/21: Left SFA stent with popliteal/TP                         trunk//ATA/peroneal PTAs;. Performing Technologist: Delorise Shiner RVT  Examination Guidelines: A complete evaluation includes at minimum, Doppler waveform signals and systolic blood pressure reading at the level of bilateral brachial, anterior tibial, and posterior tibial arteries, when vessel segments are accessible. Bilateral testing is considered an integral part of a complete examination. Photoelectric Plethysmograph (PPG) waveforms and toe  systolic pressure readings are included as required and additional duplex testing as needed. Limited examinations for reoccurring indications may be performed as noted.  ABI Findings: +---------+------------------+-----+----------+--------+ Right    Rt Pressure (mmHg)IndexWaveform  Comment  +---------+------------------+-----+----------+--------+ Brachial 223                                       +---------+------------------+-----+----------+--------+ ATA      38                0.17 monophasic         +---------+------------------+-----+----------+--------+ PTA      0                 0.00 absent             +---------+------------------+-----+----------+--------+ PERO     0                 0.00 absent             +---------+------------------+-----+----------+--------+ Great Toe19                0.08                    +---------+------------------+-----+----------+--------+ +---------+------------------+-----+----------+-------+ Left     Lt  Pressure (mmHg)IndexWaveform  Comment +---------+------------------+-----+----------+-------+ Brachial 226                                      +---------+------------------+-----+----------+-------+ ATA      164               0.73 biphasic          +---------+------------------+-----+----------+-------+ PTA      143               0.63 monophasic        +---------+------------------+-----+----------+-------+ Great Toe92                0.41                   +---------+------------------+-----+----------+-------+ +-------+-----------+-----------+------------+------------+ ABI/TBIToday's ABIToday's TBIPrevious ABIPrevious TBI +-------+-----------+-----------+------------+------------+ Right  0.17       0.08       0.96        0.58         +-------+-----------+-----------+------------+------------+ Left   0.73       0.41       0.86        0.89         +-------+-----------+-----------+------------+------------+ Bilateral ABIs appear decreased compared to prior study on 11/15/2021.  Summary: Right: Resting right ankle-brachial index indicates critical limb ischemia. The right toe-brachial index is abnormal. Left: Resting left ankle-brachial index indicates moderate left lower extremity arterial disease. The left toe-brachial index is abnormal. *See table(s) above for measurements and observations.  Electronically signed by [name] MD on 02/16/2022 at 10:59:48 AM.    Final    PERIPHERAL VASCULAR CATHETERIZATION  Result Date: 02/15/2022 See surgical note for result.  PERIPHERAL VASCULAR CATHETERIZATION  Result Date: 02/15/2022 See surgical note for result.   Assessment/Plan  Benign essential HTN blood pressure control important in reducing the progression of atherosclerotic disease. On appropriate oral medications.   Hyperlipidemia, mixed lipid control important in reducing the progression of atherosclerotic disease. Continue statin therapy   Ischemic  leg Noninvasive studies today show a  recurrent right SFA and popliteal occlusion.  Her distal perfusion is poor.  We did a vein mapping to evaluate for a fem peroneal bypass.  Unfortunately, her saphenous veins are very small and largely below 2 mm in diameter and not really usable for bypass.   This is a very difficult situation.  She is tearful today and we discussed with her and her son that there is really not a great option in this situation.  I am happy to try to revascularize her again and maybe try different anticoagulant, but I am highly concerned about the durability with her very severe disease and very small vessels.  Bypass with a prosthetic graft is not going to be durable and is really not worth the time or recovery that it would take for her and her saphenous vein is not usable for bypass so that is not really an option.  She would like [place] to perform another angiogram with revascularization which I think is very reasonable.  She understands that amputation of this right leg is significantly likely in the near future.    [name], MD  02/27/2022 11:25 AM    This note was created with Dragon medical transcription system.  Any errors from dictation are purely unintentional

## 2022-02-27 NOTE — Assessment & Plan Note (Signed)
Noninvasive studies today show a recurrent right SFA and popliteal occlusion.  Her distal perfusion is poor.  We did a vein mapping to evaluate for a fem peroneal bypass.  Unfortunately, her saphenous veins are very small and largely below 2 mm in diameter and not really usable for bypass.   This is a very difficult situation.  She is tearful today and we discussed with her and her son that there is really not a great option in this situation.  I am happy to try to revascularize her again and maybe try different anticoagulant, but I am highly concerned about the durability with her very severe disease and very small vessels.  Bypass with a prosthetic graft is not going to be durable and is really not worth the time or recovery that it would take for her and her saphenous vein is not usable for bypass so that is not really an option.  She would like [place] to perform another angiogram with revascularization which I think is very reasonable.  She understands that amputation of this right leg is significantly likely in the near future.

## 2022-02-27 NOTE — Telephone Encounter (Signed)
Spoke with the patient and she is scheduled with Dr. Lucky Cowboy for a RLE angio on 02/28/22 with a 2:00 pm arrival time to the MM. Pre-procedure instructions were discussed and handed to patient.

## 2022-02-27 NOTE — Assessment & Plan Note (Signed)
lipid control important in reducing the progression of atherosclerotic disease. Continue statin therapy  

## 2022-02-27 NOTE — Assessment & Plan Note (Signed)
blood pressure control important in reducing the progression of atherosclerotic disease. On appropriate oral medications.  

## 2022-02-28 ENCOUNTER — Encounter: Payer: Self-pay | Admitting: Vascular Surgery

## 2022-02-28 ENCOUNTER — Inpatient Hospital Stay
Admission: RE | Admit: 2022-02-28 | Discharge: 2022-03-02 | DRG: 272 | Disposition: A | Discharge status: Home / Self Care | Payer: Medicare Other | Attending: Vascular Surgery | Admitting: Vascular Surgery

## 2022-02-28 ENCOUNTER — Encounter
Admission: RE | Disposition: A | Discharge status: Home / Self Care | Payer: Self-pay | Source: Home / Self Care | Attending: Vascular Surgery

## 2022-02-28 ENCOUNTER — Other Ambulatory Visit: Payer: Self-pay

## 2022-02-28 DIAGNOSIS — I70221 Atherosclerosis of native arteries of extremities with rest pain, right leg: Secondary | ICD-10-CM

## 2022-02-28 DIAGNOSIS — E782 Mixed hyperlipidemia: Secondary | ICD-10-CM | POA: Diagnosis present

## 2022-02-28 DIAGNOSIS — T82868A Thrombosis of vascular prosthetic devices, implants and grafts, initial encounter: Secondary | ICD-10-CM | POA: Diagnosis not present

## 2022-02-28 DIAGNOSIS — Z9889 Other specified postprocedural states: Secondary | ICD-10-CM | POA: Diagnosis not present

## 2022-02-28 DIAGNOSIS — I10 Essential (primary) hypertension: Secondary | ICD-10-CM | POA: Diagnosis present

## 2022-02-28 DIAGNOSIS — Z7901 Long term (current) use of anticoagulants: Secondary | ICD-10-CM

## 2022-02-28 DIAGNOSIS — I998 Other disorder of circulatory system: Secondary | ICD-10-CM | POA: Diagnosis present

## 2022-02-28 DIAGNOSIS — Z821 Family history of blindness and visual loss: Secondary | ICD-10-CM | POA: Diagnosis not present

## 2022-02-28 DIAGNOSIS — Z87891 Personal history of nicotine dependence: Secondary | ICD-10-CM | POA: Diagnosis not present

## 2022-02-28 DIAGNOSIS — I70201 Unspecified atherosclerosis of native arteries of extremities, right leg: Principal | ICD-10-CM | POA: Diagnosis present

## 2022-02-28 DIAGNOSIS — I70229 Atherosclerosis of native arteries of extremities with rest pain, unspecified extremity: Secondary | ICD-10-CM

## 2022-02-28 DIAGNOSIS — Z7982 Long term (current) use of aspirin: Secondary | ICD-10-CM | POA: Diagnosis not present

## 2022-02-28 DIAGNOSIS — Z79899 Other long term (current) drug therapy: Secondary | ICD-10-CM

## 2022-02-28 HISTORY — PX: LOWER EXTREMITY ANGIOGRAPHY: CATH118251

## 2022-02-28 LAB — COMPREHENSIVE METABOLIC PANEL
ALT: 23 U/L (ref 0–44)
AST: 27 U/L (ref 15–41)
Albumin: 3.2 g/dL — ABNORMAL LOW (ref 3.5–5.0)
Alkaline Phosphatase: 134 U/L — ABNORMAL HIGH (ref 38–126)
Anion gap: 10 (ref 5–15)
BUN: 7 mg/dL — ABNORMAL LOW (ref 8–23)
CO2: 29 mmol/L (ref 22–32)
Calcium: 8.8 mg/dL — ABNORMAL LOW (ref 8.9–10.3)
Chloride: 103 mmol/L (ref 98–111)
Creatinine, Ser: 0.44 mg/dL (ref 0.44–1.00)
GFR, Estimated: >60 mL/min (ref >60)
Glucose, Bld: 155 mg/dL — ABNORMAL HIGH (ref 70–99)
Potassium: 2.7 mmol/L — CL (ref 3.5–5.1)
Sodium: 142 mmol/L (ref 135–145)
Total Bilirubin: 0.7 mg/dL (ref 0.3–1.2)
Total Protein: 7.3 g/dL (ref 6.5–8.1)

## 2022-02-28 LAB — CBC
HCT: 31.6 % — ABNORMAL LOW (ref 36.0–46.0)
HCT: 31.8 % — ABNORMAL LOW (ref 36.0–46.0)
Hemoglobin: 10.4 g/dL — ABNORMAL LOW (ref 12.0–15.0)
Hemoglobin: 10.5 g/dL — ABNORMAL LOW (ref 12.0–15.0)
MCH: 32.7 pg (ref 26.0–34.0)
MCH: 32.6 pg (ref 26.0–34.0)
MCHC: 32.9 g/dL (ref 30.0–36.0)
MCHC: 33 g/dL (ref 30.0–36.0)
MCV: 99.4 fL (ref 80.0–100.0)
MCV: 98.8 fL (ref 80.0–100.0)
Platelets: 610 10*3/uL — ABNORMAL HIGH (ref 150–400)
Platelets: 439 10*3/uL — ABNORMAL HIGH (ref 150–400)
RBC: 3.18 MIL/uL — ABNORMAL LOW (ref 3.87–5.11)
RBC: 3.22 MIL/uL — ABNORMAL LOW (ref 3.87–5.11)
RDW: 13.2 % (ref 11.5–15.5)
RDW: 13.2 % (ref 11.5–15.5)
WBC: 13.4 10*3/uL — ABNORMAL HIGH (ref 4.0–10.5)
WBC: 13.5 10*3/uL — ABNORMAL HIGH (ref 4.0–10.5)
nRBC: 0 % (ref 0.0–0.2)
nRBC: 0 % (ref 0.0–0.2)

## 2022-02-28 LAB — MRSA NEXT GEN BY PCR, NASAL: MRSA by PCR Next Gen: NOT DETECTED

## 2022-02-28 LAB — PROTIME-INR
INR: 1.2 (ref 0.8–1.2)
Prothrombin Time: 15.1 seconds (ref 11.4–15.2)

## 2022-02-28 LAB — GLUCOSE, CAPILLARY: Glucose-Capillary: 154 mg/dL — ABNORMAL HIGH (ref 70–99)

## 2022-02-28 LAB — CREATININE, SERUM
Creatinine, Ser: 0.53 mg/dL (ref 0.44–1.00)
GFR, Estimated: >60 mL/min (ref >60)

## 2022-02-28 LAB — BUN: BUN: 9 mg/dL (ref 8–23)

## 2022-02-28 LAB — FIBRINOGEN: Fibrinogen: 546 mg/dL — ABNORMAL HIGH (ref 210–475)

## 2022-02-28 SURGERY — LOWER EXTREMITY ANGIOGRAPHY
Anesthesia: Moderate Sedation | Site: Leg Lower | Laterality: Right

## 2022-02-28 MED ORDER — MIDAZOLAM HCL 5 MG/5ML IJ SOLN
INTRAMUSCULAR | Status: AC
  Filled 2022-02-28: qty 5

## 2022-02-28 MED ORDER — FENTANYL CITRATE (PF) 100 MCG/2ML IJ SOLN
INTRAMUSCULAR | Status: AC
  Filled 2022-02-28: qty 2

## 2022-02-28 MED ORDER — LABETALOL HCL 5 MG/ML IV SOLN
INTRAVENOUS | Status: AC
  Administered 2022-02-28: 10 mg via INTRAVENOUS
  Filled 2022-02-28: qty 4

## 2022-02-28 MED ORDER — AMLODIPINE BESYLATE 10 MG PO TABS
10.0000 mg | ORAL_TABLET | Freq: Every day | ORAL | Status: DC
  Administered 2022-02-28 – 2022-03-02 (×3): 10 mg via ORAL
  Filled 2022-02-28 (×3): qty 1

## 2022-02-28 MED ORDER — METHYLPREDNISOLONE SODIUM SUCC 125 MG IJ SOLR: 125.0000 mg | Freq: Once | INTRAMUSCULAR | Status: DC | PRN

## 2022-02-28 MED ORDER — ONDANSETRON HCL 4 MG/2ML IJ SOLN: 4.0000 mg | Freq: Four times a day (QID) | INTRAMUSCULAR | Status: DC | PRN

## 2022-02-28 MED ORDER — FENTANYL CITRATE (PF) 100 MCG/2ML IJ SOLN
INTRAMUSCULAR | Status: DC | PRN
  Administered 2022-02-28 (×2): 25 ug via INTRAVENOUS
  Administered 2022-02-28: 50 ug via INTRAVENOUS

## 2022-02-28 MED ORDER — HYDRALAZINE HCL 20 MG/ML IJ SOLN
INTRAMUSCULAR | Status: AC
  Filled 2022-02-28: qty 1

## 2022-02-28 MED ORDER — MIDAZOLAM HCL 2 MG/2ML IJ SOLN
INTRAMUSCULAR | Status: DC | PRN
  Administered 2022-02-28 (×2): 1 mg via INTRAVENOUS
  Administered 2022-02-28: 2 mg via INTRAVENOUS
  Administered 2022-02-28: 1 mg via INTRAVENOUS

## 2022-02-28 MED ORDER — HEPARIN SODIUM (PORCINE) 1000 UNIT/ML IJ SOLN
INTRAMUSCULAR | Status: DC | PRN
  Administered 2022-02-28: 3000 [IU] via INTRAVENOUS

## 2022-02-28 MED ORDER — CEFAZOLIN SODIUM-DEXTROSE 1-4 GM/50ML-% IV SOLN
INTRAVENOUS | Status: DC | PRN
  Administered 2022-02-28: 2 g via INTRAVENOUS

## 2022-02-28 MED ORDER — PHENOL 1.4 % MT LIQD: 1.0000 | OROMUCOSAL | Status: DC | PRN

## 2022-02-28 MED ORDER — PANTOPRAZOLE SODIUM 40 MG PO TBEC
40.0000 mg | DELAYED_RELEASE_TABLET | Freq: Every day | ORAL | Status: DC
  Administered 2022-03-01 – 2022-03-02 (×2): 40 mg via ORAL
  Filled 2022-02-28 (×2): qty 1

## 2022-02-28 MED ORDER — HYDROMORPHONE HCL 1 MG/ML IJ SOLN
INTRAMUSCULAR | Status: AC
  Filled 2022-02-28: qty 0.5

## 2022-02-28 MED ORDER — SODIUM CHLORIDE 0.9 % IV SOLN
0.5000 mg/h | INTRAVENOUS | Status: DC
  Filled 2022-02-28 (×2): qty 10

## 2022-02-28 MED ORDER — HYDRALAZINE HCL 20 MG/ML IJ SOLN: 5.0000 mg | INTRAMUSCULAR | Status: DC | PRN

## 2022-02-28 MED ORDER — VITAMIN D 25 MCG (1000 UNIT) PO TABS
1000.0000 [IU] | ORAL_TABLET | Freq: Every day | ORAL | Status: DC
  Administered 2022-03-01 – 2022-03-02 (×2): 1000 [IU] via ORAL
  Filled 2022-02-28 (×3): qty 1

## 2022-02-28 MED ORDER — HEPARIN (PORCINE) 25000 UT/250ML-% IV SOLN: 600.0000 [IU]/h | INTRAVENOUS | Status: DC

## 2022-02-28 MED ORDER — HEPARIN SODIUM (PORCINE) 1000 UNIT/ML IJ SOLN
INTRAMUSCULAR | Status: AC
  Filled 2022-02-28: qty 10

## 2022-02-28 MED ORDER — POTASSIUM CHLORIDE CRYS ER 20 MEQ PO TBCR: 20.0000 meq | EXTENDED_RELEASE_TABLET | Freq: Once | ORAL | Status: DC

## 2022-02-28 MED ORDER — SODIUM CHLORIDE 0.9 % IV SOLN: INTRAVENOUS | Status: DC

## 2022-02-28 MED ORDER — POTASSIUM CHLORIDE 10 MEQ/50ML IV SOLN
10.0000 meq | INTRAVENOUS | Status: AC
  Administered 2022-02-28 – 2022-03-01 (×4): 10 meq via INTRAVENOUS
  Filled 2022-02-28 (×4): qty 50

## 2022-02-28 MED ORDER — DIPHENHYDRAMINE HCL 50 MG/ML IJ SOLN: 50.0000 mg | Freq: Once | INTRAMUSCULAR | Status: DC | PRN

## 2022-02-28 MED ORDER — METOPROLOL TARTRATE 5 MG/5ML IV SOLN: 2.0000 mg | INTRAVENOUS | Status: DC | PRN

## 2022-02-28 MED ORDER — HEPARIN (PORCINE) 25000 UT/250ML-% IV SOLN
INTRAVENOUS | Status: AC
  Administered 2022-02-28: 600 [IU]/h via INTRAVENOUS
  Filled 2022-02-28: qty 250

## 2022-02-28 MED ORDER — ALTEPLASE 1 MG/ML SYRINGE FOR VASCULAR PROCEDURE
INTRAMUSCULAR | Status: DC | PRN
  Administered 2022-02-28: 8 mg via INTRA_ARTERIAL

## 2022-02-28 MED ORDER — HYDROMORPHONE HCL 1 MG/ML IJ SOLN
1.0000 mg | Freq: Once | INTRAMUSCULAR | Status: DC | PRN
  Administered 2022-02-28: 0.5 mg via INTRAVENOUS

## 2022-02-28 MED ORDER — IODIXANOL 320 MG/ML IV SOLN
INTRAVENOUS | Status: DC | PRN
  Administered 2022-02-28: 40 mL via INTRA_ARTERIAL

## 2022-02-28 MED ORDER — HYDROCODONE-ACETAMINOPHEN 5-325 MG PO TABS
1.0000 | ORAL_TABLET | Freq: Four times a day (QID) | ORAL | Status: DC | PRN
  Administered 2022-02-28 – 2022-03-02 (×5): 1 via ORAL
  Filled 2022-02-28 (×5): qty 1

## 2022-02-28 MED ORDER — CHLORHEXIDINE GLUCONATE CLOTH 2 % EX PADS
6.0000 | MEDICATED_PAD | Freq: Every day | CUTANEOUS | Status: DC
  Administered 2022-02-28 – 2022-03-01 (×2): 6 via TOPICAL

## 2022-02-28 MED ORDER — METOPROLOL TARTRATE 25 MG PO TABS
25.0000 mg | ORAL_TABLET | Freq: Two times a day (BID) | ORAL | Status: DC
  Administered 2022-02-28 – 2022-03-02 (×4): 25 mg via ORAL
  Filled 2022-02-28 (×4): qty 1

## 2022-02-28 MED ORDER — HYDROMORPHONE HCL 1 MG/ML IJ SOLN
INTRAMUSCULAR | Status: AC
  Administered 2022-02-28: 0.5 mg via INTRAVENOUS
  Filled 2022-02-28: qty 1

## 2022-02-28 MED ORDER — LABETALOL HCL 5 MG/ML IV SOLN
10.0000 mg | INTRAVENOUS | Status: DC | PRN
  Administered 2022-02-28: 10 mg via INTRAVENOUS

## 2022-02-28 MED ORDER — CEFAZOLIN SODIUM-DEXTROSE 2-4 GM/100ML-% IV SOLN: 2.0000 g | INTRAVENOUS | Status: DC

## 2022-02-28 MED ORDER — GUAIFENESIN-DM 100-10 MG/5ML PO SYRP: 15.0000 mL | ORAL_SOLUTION | ORAL | Status: DC | PRN

## 2022-02-28 MED ORDER — FAMOTIDINE 20 MG PO TABS: 40.0000 mg | ORAL_TABLET | Freq: Once | ORAL | Status: DC | PRN

## 2022-02-28 MED ORDER — ONDANSETRON HCL 4 MG PO TABS: 4.0000 mg | ORAL_TABLET | Freq: Every day | ORAL | Status: DC | PRN

## 2022-02-28 MED ORDER — HYDROMORPHONE HCL 1 MG/ML IJ SOLN
1.0000 mg | INTRAMUSCULAR | Status: DC | PRN
  Administered 2022-02-28 – 2022-03-01 (×3): 1 mg via INTRAVENOUS
  Filled 2022-02-28 (×3): qty 1

## 2022-02-28 MED ORDER — ALUM & MAG HYDROXIDE-SIMETH 200-200-20 MG/5ML PO SUSP: 15.0000 mL | ORAL | Status: DC | PRN

## 2022-02-28 MED ORDER — SODIUM CHLORIDE 0.9 % IV SOLN
1.0000 mg/h | INTRAVENOUS | Status: AC
  Administered 2022-02-28: 1 mg/h
  Filled 2022-02-28: qty 10

## 2022-02-28 MED ORDER — MIDAZOLAM HCL 2 MG/ML PO SYRP: 8.0000 mg | ORAL_SOLUTION | Freq: Once | ORAL | Status: DC | PRN

## 2022-02-28 MED ORDER — ATORVASTATIN CALCIUM 10 MG PO TABS
10.0000 mg | ORAL_TABLET | Freq: Every day | ORAL | Status: DC
  Administered 2022-03-01 – 2022-03-02 (×2): 10 mg via ORAL
  Filled 2022-02-28 (×3): qty 1

## 2022-02-28 MED ORDER — ASPIRIN 81 MG PO TBEC
81.0000 mg | DELAYED_RELEASE_TABLET | Freq: Every day | ORAL | Status: DC
  Administered 2022-03-01 – 2022-03-02 (×2): 81 mg via ORAL
  Filled 2022-02-28 (×2): qty 1

## 2022-02-28 SURGICAL SUPPLY — 19 items
BIOPATCH RED 1 DISK 7.0 (GAUZE/BANDAGES/DRESSINGS) IMPLANT
CANISTER PENUMBRA ENGINE (MISCELLANEOUS) IMPLANT
CANNULA 5F STIFF (CANNULA) IMPLANT
CATH BEACON 5 .035 65 KMP TIP (CATHETERS) IMPLANT
CATH INDIGO CAT6 KIT (CATHETERS) IMPLANT
CATH INFUS UNIFUSE 90X50 5FR (CATHETERS) IMPLANT
CATH NAVICROSS ANGLED 135CM (MICROCATHETER) IMPLANT
CATH TEMPO 5F RIM 65CM (CATHETERS) IMPLANT
COVER PROBE U/S 5X48 (MISCELLANEOUS) IMPLANT
GLIDEWIRE ADV .035X260CM (WIRE) IMPLANT
GUIDEWIRE SUPER STIFF .035X180 (WIRE) IMPLANT
KIT CV MULTILUMEN 7FR 20 (SET/KITS/TRAYS/PACK) ×1
KIT CV MULTILUMEN 7FR 20 SUB (SET/KITS/TRAYS/PACK) IMPLANT
PACK ANGIOGRAPHY (CUSTOM PROCEDURE TRAY) ×1 IMPLANT
SHEATH BRITE TIP 5FRX11 (SHEATH) IMPLANT
SHEATH BRITE TIP 6FR X 23 (SHEATH) IMPLANT
SHEATH PINNACLE ST 6F 45CM (SHEATH) IMPLANT
WIRE G V18X300CM (WIRE) IMPLANT
WIRE GUIDERIGHT .035X150 (WIRE) IMPLANT

## 2022-02-28 NOTE — Op Note (Signed)
[facility]  Percutaneous Study/Intervention Procedural Note   Date of Surgery: 02/28/2022  Surgeon(s):Tevion Laforge    Assistants:none  Pre-operative Diagnosis: PAD with rest pain right lower extremity, acute on chronic ischemia  Post-operative diagnosis:  Same  Procedure(s) Performed:             1.  Ultrasound guidance for vascular access left femoral artery             2.  Catheter placement into right peroneal artery from left femoral approach             3.  Selective right lower extremity angiogram             4.  Catheter directed thrombolytic therapy with 8 mg of tPA in the right SFA and popliteal arteries and placement of a thrombolytic catheter for overnight thrombolytic therapy in the right common femoral, SFA, and popliteal arteries with 90 cm total length 50 cm working length catheter             5.  Mechanical thrombectomy of the right SFA and popliteal arteries with the penumbra CAT 6 device  6.  Ultrasound guidance for vascular access left femoral vein             7.  Placement of the left femoral venous triple-lumen catheter  EBL: 100 cc  Contrast: 40 cc  Fluoro Time: 5.9 minutes  Moderate Conscious Sedation Time: approximately 59 minutes using 5 mg of Versed and 100 mcg of Fentanyl              Indications:  Patient is a 66 y.o.female with recurrent ischemia of her right leg with rest pain. The patient has noninvasive study showing collusion of her previous interventions and an inadequate saphenous vein for bypass on either side.  She also had noted to have a poor target for bypass. The patient is brought in for angiography for further evaluation and potential treatment.  Due to the limb threatening nature of the situation, angiogram was performed for attempted limb salvage. The patient is aware that if the procedure fails, amputation would be expected.  The patient also understands that even with successful revascularization, amputation may still  be required due to the severity of the situation.  Risks and benefits are discussed and informed consent is obtained.   Procedure:  The patient was identified and appropriate procedural time out was performed.  The patient was then placed supine on the table and prepped and draped in the usual sterile fashion. Moderate conscious sedation was administered during a face to face encounter with the patient throughout the procedure with my supervision of the RN administering medicines and monitoring the patient's vital signs, pulse oximetry, telemetry and mental status throughout from the start of the procedure until the patient was taken to the recovery room. Ultrasound was used to evaluate the left common femoral artery.  It was patent .  A digital ultrasound image was acquired.  A Seldinger needle was used to access the left common femoral artery under direct ultrasound guidance and a permanent image was performed.  A 0.035 J wire was advanced without resistance and a 5Fr sheath was placed. I then crossed the aortic bifurcation and advanced to the right femoral head. Selective right lower extremity angiogram was then performed. This demonstrated recurrent occlusion of the right SFA and popliteal stents.  There is essentially no runoff distally with only a faint reconstitution of what appeared to be the mid to  distal peroneal artery which was very small. It was felt that it was in the patient's best interest to proceed with intervention after these images to avoid a second procedure and a larger amount of contrast and fluoroscopy based off of the findings from the initial angiogram. The patient was systemically heparinized and a 6 French sheath was then placed over the Terumo Advantage wire. I then used a Kumpe catheter and the advantage wire to get into the SFA occlusion without difficulty and then exchanged for a V18 wire which I parked in the distal peroneal artery.  The Kumpe catheter was used to instill 8 mg of  tPA in the right SFA and popliteal arteries.  I then brought the penumbra CAT 6 catheter on the field and made 3 passes in the right SFA and popliteal arteries..  There remained essentially no flow distally and I felt our only chance that salvage would be a thrombolytic catheter to try to open up the runoff vessel and remove the thrombus.  A 90 cm total length 50 cm working length catheter was parked from the right common femoral artery down to the below-knee popliteal artery and secured into place with a silk suture as was the 6 [place] sheath.  I then placed a central line for venous access.  The left femoral vein was visualized with ultrasound and found to be widely patent and accessed under direct ultrasound guidance without difficulty with a Seldinger needle.  A J-wire was placed.  After skin nick and dilatation a triple-lumen catheter was placed over the wire and the wire was removed.  All 3 lm withdrew dark red nonpulsatile blood and flushed easily with sterile saline.  I elected to terminate the procedure. The patient was taken to the recovery room in stable condition having tolerated the procedure well.  Findings:                         Right Lower Extremity:  This demonstrated recurrent occlusion of the right SFA and popliteal stents.  There is essentially no runoff distally with only a faint reconstitution of what appeared to be the mid to distal peroneal artery which was very small.   Disposition: Patient was taken to the recovery room in stable condition having tolerated the procedure well.  Complications: None  [name] 02/28/2022 3:32 PM   This note was created with Dragon Medical transcription system. Any errors in dictation are purely unintentional.

## 2022-03-01 ENCOUNTER — Encounter
Admission: RE | Disposition: A | Discharge status: Home / Self Care | Payer: Self-pay | Source: Home / Self Care | Attending: Vascular Surgery

## 2022-03-01 ENCOUNTER — Encounter: Payer: Self-pay | Admitting: Vascular Surgery

## 2022-03-01 DIAGNOSIS — Z9889 Other specified postprocedural states: Secondary | ICD-10-CM

## 2022-03-01 HISTORY — PX: PERIPHERAL VASCULAR THROMBECTOMY: CATH118306

## 2022-03-01 LAB — CBC
HCT: 29.5 % — ABNORMAL LOW (ref 36.0–46.0)
Hemoglobin: 9.7 g/dL — ABNORMAL LOW (ref 12.0–15.0)
MCH: 32.9 pg (ref 26.0–34.0)
MCHC: 32.9 g/dL (ref 30.0–36.0)
MCV: 100 fL (ref 80.0–100.0)
Platelets: 421 10*3/uL — ABNORMAL HIGH (ref 150–400)
RBC: 2.95 MIL/uL — ABNORMAL LOW (ref 3.87–5.11)
RDW: 13.2 % (ref 11.5–15.5)
WBC: 13.5 10*3/uL — ABNORMAL HIGH (ref 4.0–10.5)
nRBC: 0 % (ref 0.0–0.2)

## 2022-03-01 LAB — BASIC METABOLIC PANEL
Anion gap: 6 (ref 5–15)
BUN: 9 mg/dL (ref 8–23)
CO2: 28 mmol/L (ref 22–32)
Calcium: 8.7 mg/dL — ABNORMAL LOW (ref 8.9–10.3)
Chloride: 106 mmol/L (ref 98–111)
Creatinine, Ser: 0.47 mg/dL (ref 0.44–1.00)
GFR, Estimated: >60 mL/min (ref >60)
Glucose, Bld: 133 mg/dL — ABNORMAL HIGH (ref 70–99)
Potassium: 3.8 mmol/L (ref 3.5–5.1)
Sodium: 140 mmol/L (ref 135–145)

## 2022-03-01 LAB — FIBRINOGEN: Fibrinogen: 237 mg/dL (ref 210–475)

## 2022-03-01 SURGERY — PERIPHERAL VASCULAR THROMBECTOMY
Anesthesia: Moderate Sedation | Laterality: Right

## 2022-03-01 MED ORDER — MIDAZOLAM HCL 2 MG/2ML IJ SOLN
INTRAMUSCULAR | Status: DC | PRN
  Administered 2022-03-01: 2 mg via INTRAVENOUS

## 2022-03-01 MED ORDER — DIPHENHYDRAMINE HCL 50 MG/ML IJ SOLN: 50.0000 mg | Freq: Once | INTRAMUSCULAR | Status: DC | PRN

## 2022-03-01 MED ORDER — FENTANYL CITRATE (PF) 100 MCG/2ML IJ SOLN
INTRAMUSCULAR | Status: DC | PRN
  Administered 2022-03-01: 50 ug via INTRAVENOUS

## 2022-03-01 MED ORDER — TIROFIBAN HCL IV 12.5 MG/250 ML: 0.0750 ug/kg/min | INTRAVENOUS | Status: AC

## 2022-03-01 MED ORDER — TIROFIBAN (AGGRASTAT) BOLUS VIA INFUSION
25.0000 ug/kg | Freq: Once | INTRAVENOUS | Status: AC
  Administered 2022-03-01: 1202.5 ug via INTRAVENOUS
  Filled 2022-03-01: qty 25

## 2022-03-01 MED ORDER — CEFAZOLIN SODIUM-DEXTROSE 2-4 GM/100ML-% IV SOLN
2.0000 g | INTRAVENOUS | Status: DC
  Filled 2022-03-01: qty 100

## 2022-03-01 MED ORDER — NITROGLYCERIN 1 MG/10 ML FOR IR/CATH LAB
INTRA_ARTERIAL | Status: AC
  Filled 2022-03-01: qty 10

## 2022-03-01 MED ORDER — MIDAZOLAM HCL 2 MG/ML PO SYRP: 8.0000 mg | ORAL_SOLUTION | Freq: Once | ORAL | Status: DC | PRN

## 2022-03-01 MED ORDER — IODIXANOL 320 MG/ML IV SOLN
INTRAVENOUS | Status: DC | PRN
  Administered 2022-03-01: 35 mL

## 2022-03-01 MED ORDER — FAMOTIDINE 20 MG PO TABS: 40.0000 mg | ORAL_TABLET | Freq: Once | ORAL | Status: DC | PRN

## 2022-03-01 MED ORDER — SODIUM CHLORIDE 0.9 % IV SOLN: INTRAVENOUS | Status: DC

## 2022-03-01 MED ORDER — HYDROMORPHONE HCL 1 MG/ML IJ SOLN: 1.0000 mg | Freq: Once | INTRAMUSCULAR | Status: DC | PRN

## 2022-03-01 MED ORDER — ONDANSETRON HCL 4 MG/2ML IJ SOLN: 4.0000 mg | Freq: Four times a day (QID) | INTRAMUSCULAR | Status: DC | PRN

## 2022-03-01 MED ORDER — TIROFIBAN HCL IV 12.5 MG/250 ML
INTRAVENOUS | Status: AC
  Administered 2022-03-01: 0.15 ug/kg/min via INTRAVENOUS
  Filled 2022-03-01: qty 250

## 2022-03-01 MED ORDER — HEPARIN SODIUM (PORCINE) 1000 UNIT/ML IJ SOLN
INTRAMUSCULAR | Status: AC
  Filled 2022-03-01: qty 10

## 2022-03-01 MED ORDER — CEFAZOLIN SODIUM-DEXTROSE 2-4 GM/100ML-% IV SOLN
2.0000 g | INTRAVENOUS | Status: AC
  Administered 2022-03-01: 2 g via INTRAVENOUS

## 2022-03-01 MED ORDER — CHLORHEXIDINE GLUCONATE CLOTH 2 % EX PADS
6.0000 | MEDICATED_PAD | Freq: Every day | CUTANEOUS | Status: DC
  Administered 2022-03-01: 6 via TOPICAL

## 2022-03-01 MED ORDER — FENTANYL CITRATE (PF) 100 MCG/2ML IJ SOLN
INTRAMUSCULAR | Status: AC
  Filled 2022-03-01: qty 2

## 2022-03-01 MED ORDER — METHYLPREDNISOLONE SODIUM SUCC 125 MG IJ SOLR: 125.0000 mg | Freq: Once | INTRAMUSCULAR | Status: DC | PRN

## 2022-03-01 MED ORDER — MIDAZOLAM HCL 5 MG/5ML IJ SOLN
INTRAMUSCULAR | Status: AC
  Filled 2022-03-01: qty 5

## 2022-03-01 MED ORDER — RIVAROXABAN 20 MG PO TABS
20.0000 mg | ORAL_TABLET | Freq: Every day | ORAL | Status: DC
  Administered 2022-03-02: 20 mg via ORAL
  Filled 2022-03-01: qty 1

## 2022-03-01 MED ORDER — TIROFIBAN HCL IN NACL 5-0.9 MG/100ML-% IV SOLN: 0.1500 ug/kg/min | INTRAVENOUS | Status: DC

## 2022-03-01 MED ORDER — HEPARIN SODIUM (PORCINE) 1000 UNIT/ML IJ SOLN
INTRAMUSCULAR | Status: DC | PRN
  Administered 2022-03-01: 3000 [IU] via INTRAVENOUS

## 2022-03-01 SURGICAL SUPPLY — 12 items
BALLN ULTRVRSE 2.5X300X150 (BALLOONS) ×1
BALLN ULTRVRSE 3X80X150 (BALLOONS) ×1
BALLN ULTRVRSE 3X80X150 OTW (BALLOONS) ×1
BALLOON ULTRVRSE 2.5X300X150 (BALLOONS) IMPLANT
BALLOON ULTRVRSE 3X80X150 OTW (BALLOONS) IMPLANT
BIOPATCH WHT 1IN DISK W/4.0 H (GAUZE/BANDAGES/DRESSINGS) IMPLANT
DEVICE SAFEGUARD 24CM (GAUZE/BANDAGES/DRESSINGS) IMPLANT
DEVICE STARCLOSE SE CLOSURE (Vascular Products) IMPLANT
KIT ENCORE 26 ADVANTAGE (KITS) IMPLANT
PACK ANGIOGRAPHY (CUSTOM PROCEDURE TRAY) ×1 IMPLANT
WIRE G V18X300CM (WIRE) IMPLANT
WIRE GUIDERIGHT .035X150 (WIRE) IMPLANT

## 2022-03-01 NOTE — Interval H&P Note (Signed)
History and Physical Interval Note:  03/01/2022 8:13 AM  Yvonne [name]  has presented today for surgery, with the diagnosis of Right Leg ischemia.  The various methods of treatment have been discussed with the patient and family. After consideration of risks, benefits and other options for treatment, the patient has consented to  Procedure(s): PERIPHERAL VASCULAR THROMBECTOMY (Right) as a surgical intervention.  The patient's history has been reviewed, patient examined, no change in status, stable for surgery.  I have reviewed the patient's chart and labs.  Questions were answered to the patient's satisfaction.     [name]

## 2022-03-01 NOTE — Op Note (Signed)
[facility]  Percutaneous Study/Intervention Procedural Note   Date of Surgery: 03/01/2022  Surgeon(s):Hershel Corkery    Assistants:none  Pre-operative Diagnosis: PAD with rest [name] right lower extremity, status post overnight thrombolytic therapy  Post-operative diagnosis:  Same  Procedure(s) Performed:             1.  Right lower extremity angiogram             2.  Percutaneous transluminal angioplasty of the right peroneal artery and tibioperoneal trunk with 2.5 mm diameter angioplasty balloon             3.  Percutaneous transluminal angioplasty of the right distal popliteal artery and tibioperoneal trunk with a 3 mm diameter angioplasty balloon             4.  StarClose closure device left femoral artery  EBL: 5 cc  Contrast: 35 cc  Fluoro Time: One-point.  Minutes  Moderate Conscious Sedation Time: approximately 19 minutes using 2 mg of Versed and 50 mcg of Fentanyl              Indications:  Patient is a 66 y.o.female with ischemic rest [name] with acute on chronic ischemia status post overnight thrombolytic therapy. The patient is brought in for angiography for further evaluation and potential treatment.  Due to the limb threatening nature of the situation, angiogram was performed for attempted limb salvage. The patient is aware that if the procedure fails, amputation would be expected.  The patient also understands that even with successful revascularization, amputation may still be required due to the severity of the situation.  Risks and benefits are discussed and informed consent is obtained.   Procedure:  The patient was identified and appropriate procedural time out was performed.  The patient was then placed supine on the table and prepped and draped in the usual sterile fashion. Moderate conscious sedation was administered during a face to face encounter with the patient throughout the procedure with my supervision of the RN administering medicines and  monitoring the patient's vital signs, pulse oximetry, telemetry and mental status throughout from the start of the procedure until the patient was taken to the recovery room.  The existing thrombolytic catheter was removed after placing a V18 wire into the distal peroneal artery.  Selective right lower extremity angiogram was then performed. This demonstrated the common femoral artery, profunda femoris artery, SFA and popliteal stents all to be patent with no residual thrombus or stenosis.  The distal popliteal artery, tibioperoneal trunk, and proximal portion of the peroneal artery all remained highly narrowed with greater than 80% stenosis although there did not appear to be a large volume of thrombus residual in this area.  The mid peroneal artery normalized and was then continuous distally as the only runoff vessel.  The anterior tibial artery occluded in the midsegment the posterior tibial artery was not seen. It was felt that it was in the patient's best interest to proceed with intervention after these images to avoid a second procedure and a larger amount of contrast and fluoroscopy based off of the findings from the initial angiogram. The patient was systemically heparinized and I then performed angioplasty first with a 2-1/2 mm diameter by 30 cm length angioplasty balloon from the mid to distal peroneal artery up through the tibioperoneal trunk and popliteal artery.  This was inflated to 8 atm for 1 minute.  Following this, the peroneal artery was markedly improved with less than 20% residual stenosis but  there is still remained 50% or greater stenosis in the tibioperoneal trunk and distal popliteal artery.  I then upsized to a 3 mm diameter by 8 cm length angioplasty balloon to perform angioplasty on the distal popliteal artery and the TP trunk and take this up to 6 atm for 1 minute.  Completion imaging only showed about a 30% residual stenosis in the distal popliteal artery and tibioperoneal trunk after  treatment.  I elected to terminate the procedure. The sheath was removed and StarClose closure device was deployed in the left femoral artery with excellent hemostatic result. The patient was taken to the recovery room in stable condition having tolerated the procedure well.  Findings:                            Right Lower Extremity:  This demonstrated the common femoral artery, profunda femoris artery, SFA and popliteal stents all to be patent with no residual thrombus or stenosis.  The distal popliteal artery, tibioperoneal trunk, and proximal portion of the peroneal artery all remained highly narrowed with greater than 80% stenosis although there did not appear to be a large volume of thrombus residual in this area.  The mid peroneal artery normalized and was then continuous distally as the only runoff vessel.  The anterior tibial artery occluded in the midsegment the posterior tibial artery was not seen.   Disposition: Patient was taken to the recovery room in stable condition having tolerated the procedure well.  Complications: None  Yvonne [name] 03/01/2022 8:51 AM   This note was created with Dragon Medical transcription system. Any errors in dictation are purely unintentional.

## 2022-03-02 ENCOUNTER — Encounter: Payer: Self-pay | Admitting: Vascular Surgery

## 2022-03-02 MED ORDER — RIVAROXABAN 20 MG PO TABS: 20.0000 mg | ORAL_TABLET | Freq: Every day | ORAL | 3 refills | Status: DC

## 2022-03-02 NOTE — Discharge Summary (Signed)
[facility]    Discharge Summary    Patient ID:  Yvonne [name] MRN: [id number] DOB/AGE: February 22, 1956 66 y.o.  Admit date: 02/28/2022 Discharge date: 03/02/2022 Date of Surgery: 03/01/2022 Surgeon: Surgeon(s): Wyn Quaker Marlow Baars, MD  Admission Diagnosis: Ischemic leg [I99.8]  Discharge Diagnoses:  Ischemic leg [I99.8]  Secondary Diagnoses: Past Medical History:  Diagnosis Date   Hyperlipidemia    Hypertension    Peripheral vascular disease (HCC)    Syncope     Procedure(s): PERIPHERAL VASCULAR THROMBECTOMY  Discharged Condition: good  HPI:  Came in with recurrent RLE ischemia  Hospital Course:  Yvonne [name] is a 66 y.o. female is S/P Right Procedure(s): PERIPHERAL VASCULAR THROMBECTOMY Extubated: POD #  NA Physical exam: foot warm, access site without hematoma Post-op wounds healing well Pt. Ambulating, voiding and taking PO diet without difficulty. Pt pain controlled with PO pain meds. Labs as below Complications:none  Consults:    Significant Diagnostic Studies: CBC Lab Results  Component Value Date   WBC 13.5 (H) 03/01/2022   HGB 9.7 (L) 03/01/2022   HCT 29.5 (L) 03/01/2022   MCV 100.0 03/01/2022   PLT 421 (H) 03/01/2022    BMET    Component Value Date/Time   NA 140 03/01/2022 0518   K 3.8 03/01/2022 0518   CL 106 03/01/2022 0518   CO2 28 03/01/2022 0518   GLUCOSE 133 (H) 03/01/2022 0518   BUN 9 03/01/2022 0518   CREATININE 0.47 03/01/2022 0518   CALCIUM 8.7 (L) 03/01/2022 0518   GFRNONAA >60 03/01/2022 0518   GFRAA >60 12/02/2019 1055   COAG Lab Results  Component Value Date   INR 1.2 02/28/2022   INR 1.2 02/15/2022   INR 1.2 10/27/2021     Disposition:  Discharge to :Home Discharge Instructions     Call MD for:  redness, tenderness, or signs of infection (pain, swelling, bleeding, redness, odor or green/yellow discharge around incision site)   Complete by: As directed    Call MD for:  severe or increased  pain, loss or decreased feeling  in affected limb(s)   Complete by: As directed    Call MD for:  temperature >100.5   Complete by: As directed    Driving Restrictions   Complete by: As directed    No driving for 24 hours   Lifting restrictions   Complete by: As directed    No lifting for 48 hours   No dressing needed   Complete by: As directed    Replace only if drainage present   Resume previous diet   Complete by: As directed       Allergies as of 03/02/2022   No Known Allergies      Medication List     STOP taking these medications    apixaban 5 MG Tabs tablet Commonly known as: ELIQUIS       TAKE these medications    amLODipine 10 MG tablet Commonly known as: NORVASC Take 1 tablet (10 mg total) by mouth daily.   aspirin EC 81 MG tablet Take 81 mg by mouth daily. Swallow whole.   atorvastatin 10 MG tablet Commonly known as: LIPITOR Take 10 mg by mouth daily.   cholecalciferol 25 MCG (1000 UNIT) tablet Commonly known as: VITAMIN D3 Take 1,000 Units by mouth daily.   HYDROcodone-acetaminophen 5-325 MG tablet Commonly known as: Norco Take 1 tablet by mouth every 6 (six) hours as needed for moderate pain.   metoprolol tartrate 25 MG tablet Commonly  known as: LOPRESSOR Take 1 tablet (25 mg total) by mouth 2 (two) times daily.   rivaroxaban 20 MG Tabs tablet Commonly known as: XARELTO Take 1 tablet (20 mg total) by mouth daily.       Verbal and written Discharge instructions given to the patient. Wound care per Discharge AVS  Follow-up Information     Kris Hartmann, NP Follow up in 3 week(s).   Specialty: Vascular Surgery Why: with ABIs Contact information: [place] [address] [phone]                 Signed: [name], MD  03/02/2022, 8:29 AM

## 2022-03-02 NOTE — Progress Notes (Signed)
A&OX4. VSS. R pedal pulse dopplered loudly, L pedal pulse palpated. PAD removed. Site soft and level 0. Central line removed and pressure held until bleeding stopped. Site intact with gauze and tagederm in place. Discharge instructions reviewed with pt at bedside and IV removed.

## 2022-03-05 ENCOUNTER — Telehealth (INDEPENDENT_AMBULATORY_CARE_PROVIDER_SITE_OTHER): Payer: Self-pay

## 2022-03-05 ENCOUNTER — Other Ambulatory Visit (INDEPENDENT_AMBULATORY_CARE_PROVIDER_SITE_OTHER): Payer: Self-pay | Admitting: Nurse Practitioner

## 2022-03-05 MED ORDER — HYDROCODONE-ACETAMINOPHEN 5-325 MG PO TABS: 1.0000 | ORAL_TABLET | Freq: Four times a day (QID) | ORAL | 0 refills | Status: DC | PRN

## 2022-03-05 NOTE — Telephone Encounter (Signed)
Sent!

## 2022-03-05 NOTE — Telephone Encounter (Signed)
Patient has been made aware.

## 2022-03-08 ENCOUNTER — Emergency Department
Admission: EM | Admit: 2022-03-08 | Discharge: 2022-03-08 | Disposition: A | Discharge status: Home / Self Care | Payer: Medicare Other | Attending: Emergency Medicine | Admitting: Emergency Medicine

## 2022-03-08 ENCOUNTER — Encounter: Payer: Self-pay | Admitting: Vascular Surgery

## 2022-03-08 ENCOUNTER — Other Ambulatory Visit: Payer: Self-pay

## 2022-03-08 DIAGNOSIS — Z008 Encounter for other general examination: Secondary | ICD-10-CM

## 2022-03-08 DIAGNOSIS — S0081XA Abrasion of other part of head, initial encounter: Secondary | ICD-10-CM | POA: Diagnosis not present

## 2022-03-08 DIAGNOSIS — S0993XA Unspecified injury of face, initial encounter: Secondary | ICD-10-CM | POA: Diagnosis present

## 2022-03-08 DIAGNOSIS — Z0279 Encounter for issue of other medical certificate: Secondary | ICD-10-CM | POA: Diagnosis not present

## 2022-03-08 NOTE — Discharge Instructions (Addendum)
Patient will superficial abrasion to the right cheek.  Denies any other injury or complaint.  She is currently cleared for incarceration.
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
## 2022-03-08 NOTE — ED Notes (Signed)
Pt Dc in custody of [employer].
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
## 2022-03-12 ENCOUNTER — Other Ambulatory Visit (INDEPENDENT_AMBULATORY_CARE_PROVIDER_SITE_OTHER): Payer: Self-pay | Admitting: Nurse Practitioner

## 2022-03-26 ENCOUNTER — Other Ambulatory Visit (INDEPENDENT_AMBULATORY_CARE_PROVIDER_SITE_OTHER): Payer: Self-pay | Admitting: Vascular Surgery

## 2022-03-26 DIAGNOSIS — Z9889 Other specified postprocedural states: Secondary | ICD-10-CM

## 2022-03-28 ENCOUNTER — Ambulatory Visit (INDEPENDENT_AMBULATORY_CARE_PROVIDER_SITE_OTHER): Payer: Medicare Other | Admitting: Nurse Practitioner

## 2022-03-28 ENCOUNTER — Encounter (INDEPENDENT_AMBULATORY_CARE_PROVIDER_SITE_OTHER): Payer: Medicare Other

## 2022-04-23 ENCOUNTER — Ambulatory Visit (INDEPENDENT_AMBULATORY_CARE_PROVIDER_SITE_OTHER): Payer: Medicare Other

## 2022-04-23 DIAGNOSIS — Z9889 Other specified postprocedural states: Secondary | ICD-10-CM | POA: Diagnosis not present

## 2022-04-23 DIAGNOSIS — I739 Peripheral vascular disease, unspecified: Secondary | ICD-10-CM | POA: Diagnosis not present

## 2022-04-28 ENCOUNTER — Other Ambulatory Visit (INDEPENDENT_AMBULATORY_CARE_PROVIDER_SITE_OTHER): Payer: Self-pay | Admitting: Nurse Practitioner

## 2022-05-01 ENCOUNTER — Ambulatory Visit (INDEPENDENT_AMBULATORY_CARE_PROVIDER_SITE_OTHER): Payer: Medicare Other | Admitting: Nurse Practitioner

## 2022-05-01 ENCOUNTER — Encounter (INDEPENDENT_AMBULATORY_CARE_PROVIDER_SITE_OTHER): Payer: Self-pay | Admitting: Nurse Practitioner

## 2022-05-01 VITALS — BP 174/86 | HR 67 | Resp 16 | Ht 63.0 in | Wt 109.0 lb

## 2022-05-01 DIAGNOSIS — I10 Essential (primary) hypertension: Secondary | ICD-10-CM | POA: Diagnosis not present

## 2022-05-01 DIAGNOSIS — E782 Mixed hyperlipidemia: Secondary | ICD-10-CM

## 2022-05-01 DIAGNOSIS — Z9889 Other specified postprocedural states: Secondary | ICD-10-CM | POA: Diagnosis not present

## 2022-05-01 DIAGNOSIS — I739 Peripheral vascular disease, unspecified: Secondary | ICD-10-CM | POA: Diagnosis not present

## 2022-05-13 ENCOUNTER — Encounter (INDEPENDENT_AMBULATORY_CARE_PROVIDER_SITE_OTHER): Payer: Self-pay | Admitting: Nurse Practitioner

## 2022-05-13 NOTE — Progress Notes (Signed)
Subjective:    Patient ID: Yvonne [name], female    DOB: 1955-06-09, 66 y.o.   MRN: [id number] Chief Complaint  Patient presents with   Follow-up    ultrasound    The patient returns to the office for followup and review status post angiogram with intervention on 03/01/2022.   Procedure:  Procedure(s) Performed:             1.  Right lower extremity angiogram             2.  Percutaneous transluminal angioplasty of the right peroneal artery and tibioperoneal trunk with 2.5 mm diameter angioplasty balloon             3.  Percutaneous transluminal angioplasty of the right distal popliteal artery and tibioperoneal trunk with a 3 mm diameter angioplasty balloon             4.  StarClose closure device left femoral artery   The patient notes improvement in the lower extremity symptoms. No interval shortening of the patient's claudication distance or rest pain symptoms. No new ulcers or wounds have occurred since the last visit.  There have been no significant changes to the patient's overall health care.  No documented history of amaurosis fugax or recent TIA symptoms. There are no recent neurological changes noted. No documented history of DVT, PE or superficial thrombophlebitis. The patient denies recent episodes of angina or shortness of breath.   ABI's Rt=1.01 and Lt=0.86  (previous ABI's Rt=0.17 and Lt=0.76) Duplex US of the bilateral tibial arteries shows biphasic tibial waveforms in the right lower extremity with biphasic/monophasic in the left    Review of Systems  All other systems reviewed and are negative.      Objective:   Physical Exam Vitals reviewed.  HENT:     Head: Normocephalic.  Cardiovascular:     Rate and Rhythm: Normal rate.     Pulses:          Dorsalis pedis pulses are detected w/ Doppler on the right side and detected w/ Doppler on the left side.       Posterior tibial pulses are detected w/ Doppler on the right side and detected w/ Doppler on the  left side.  Pulmonary:     Effort: Pulmonary effort is normal.  Skin:    General: Skin is warm and dry.  Neurological:     Mental Status: She is alert and oriented to person, place, and time.  Psychiatric:        Mood and Affect: Mood normal.        Behavior: Behavior normal.        Thought Content: Thought content normal.        Judgment: Judgment normal.     BP (!) 174/86 (BP Location: Left Arm)   Pulse 67   Resp 16   Ht  (1.6 m)   Wt 109 lb (49.4 kg)   BMI 19.31 kg/m   Past Medical History:  Diagnosis Date   Hyperlipidemia    Hypertension    Peripheral vascular disease (HCC)    Syncope     Social History   Socioeconomic History   Marital status: Divorced    Spouse name: Not on file   Number of children: 2   Years of education: Not on file   Highest education level: Not on file  Occupational History   Not on file  Tobacco Use   Smoking status: Former    Packs/day: 1.00  Types: Cigarettes    Quit date: 10/27/2021    Years since quitting: 0.5   Smokeless tobacco: Never  Substance and Sexual Activity   Alcohol use: Not on file    Comment: occ   Drug use: Never   Sexual activity: Not on file  Other Topics Concern   Not on file  Social History Narrative   Lives with her brother Maisie Fushomas in [place]    Social Determinants of Health   Financial Resource Strain: Not on file  Food Insecurity: Not on file  Transportation Needs: Not on file  Physical Activity: Not on file  Stress: Not on file  Social Connections: Not on file  Intimate Partner Violence: Not on file    Past Surgical History:  Procedure Laterality Date   LOWER EXTREMITY ANGIOGRAPHY Right 10/02/2021   Procedure: Lower Extremity Angiography;  Surgeon: Annice Needyew, Jason S, MD;  Location: [facility];  Service: Cardiovascular;  Laterality: Right;   LOWER EXTREMITY ANGIOGRAPHY Left 10/09/2021   Procedure: Lower Extremity Angiography;  Surgeon: Annice Needyew, Jason S, MD;  Location: [facility];  Service: Cardiovascular;  Laterality: Left;   LOWER EXTREMITY ANGIOGRAPHY Right 10/27/2021   Procedure: Lower Extremity Angiography;  Surgeon: Annice Needyew, Jason S, MD;  Location: [facility];  Service: Cardiovascular;  Laterality: Right;   LOWER EXTREMITY ANGIOGRAPHY Right 02/15/2022   Procedure: Lower Extremity Angiography;  Surgeon: Annice Needyew, Jason S, MD;  Location: [facility];  Service: Cardiovascular;  Laterality: Right;   LOWER EXTREMITY ANGIOGRAPHY Right 02/28/2022   Procedure: Lower Extremity Angiography;  Surgeon: Annice Needyew, Jason S, MD;  Location: [facility];  Service: Cardiovascular;  Laterality: Right;   LOWER EXTREMITY INTERVENTION Right 10/27/2021   Procedure: LOWER EXTREMITY INTERVENTION;  Surgeon: Annice Needyew, Jason S, MD;  Location: [facility];  Service: Cardiovascular;  Laterality: Right;   LOWER EXTREMITY INTERVENTION Right 02/15/2022   Procedure: LOWER EXTREMITY INTERVENTION;  Surgeon: Annice Needyew, Jason S, MD;  Location: [facility];  Service: Cardiovascular;  Laterality: Right;   PARTIAL HYSTERECTOMY     PERIPHERAL VASCULAR THROMBECTOMY Right 03/01/2022   Procedure: PERIPHERAL VASCULAR THROMBECTOMY;  Surgeon: Annice Needyew, Jason S, MD;  Location: [facility];  Service: Cardiovascular;  Laterality: Right;    History reviewed. No pertinent family history.  No Known Allergies     Latest Ref Rng & Units 03/01/2022    5:18 AM 02/28/2022    7:29 PM 02/28/2022    4:04 PM  CBC  WBC 4.0 - 10.5 K/uL 13.5  13.5  13.4   Hemoglobin 12.0 - 15.0 g/dL 9.7  16.110.5  09.610.4   Hematocrit 36.0 - 46.0 % 29.5  31.8  31.6   Platelets 150 - 400 K/uL 421  439  610       CMP     Component Value Date/Time   NA 140 03/01/2022 0518   K 3.8 03/01/2022 0518   CL 106 03/01/2022 0518   CO2 28 03/01/2022 0518   GLUCOSE 133 (H) 03/01/2022 0518   BUN 9 03/01/2022 0518   CREATININE 0.47 03/01/2022 0518   CALCIUM 8.7 (L) 03/01/2022 0518   PROT 7.3 02/28/2022 1929   ALBUMIN 3.2 (L)  02/28/2022 1929   AST 27 02/28/2022 1929   ALT 23 02/28/2022 1929   ALKPHOS 134 (H) 02/28/2022 1929   BILITOT 0.7 02/28/2022 1929   GFRNONAA >60 03/01/2022 0518   GFRAA >60 12/02/2019 1055     VAS US ABI WITH/WO TBI  Result Date: 04/25/2022  LOWER EXTREMITY DOPPLER STUDY Patient Name:  Yessenia Maillet  Date of Exam:   04/23/2022 Medical Rec #: [id number]    Accession #:    [id number] Date of Birth: [birth year]   Patient Gender: F Patient Age:   89 years Exam Location:  [facility] Procedure:      VAS [place] ABI WITH/WO TBI Referring Phys: --------------------------------------------------------------------------------  Indications: Rest pain, and peripheral artery disease. High Risk Factors: Hypertension, hyperlipidemia.  Vascular Interventions: 03/01/2022 Percutaneous transluminal angioplasty of the                         right peroneal artery and tibioperoneal trunk with 2.5                         mm diameter angioplasty balloon                         3. Percutaneous transluminal angioplasty of the right                         distal popliteal artery and tibioperoneal trunk with a 3                         mm diameter angioplasty balloon                         10/27/2021 rt thrombectomy plus new poplitealstent                         10/02/21: Right SFA/popliteal stent with TP trunk/peroneal                         PTAs;                         10/09/21: Left SFA stent with popliteal/TP                         trunk//ATA/peroneal PTAs;. Comparison Study: 02/16/2022 Performing Technologist: Salvadore Farber RVT  Examination Guidelines: A complete evaluation includes at minimum, Doppler waveform signals and systolic blood pressure reading at the level of bilateral brachial, anterior tibial, and posterior tibial arteries, when vessel segments are accessible. Bilateral testing is considered an integral part of a complete examination. Photoelectric Plethysmograph (PPG) waveforms and toe systolic pressure  readings are included as required and additional duplex testing as needed. Limited examinations for reoccurring indications may be performed as noted.  ABI Findings: +---------+------------------+-----+--------+--------+ Right    Rt Pressure (mmHg)IndexWaveformComment  +---------+------------------+-----+--------+--------+ Brachial 158                                     +---------+------------------+-----+--------+--------+ ATA      162               1.01 biphasic         +---------+------------------+-----+--------+--------+ PTA      157               0.98 biphasic         +---------+------------------+-----+--------+--------+ [facility]  0.73 Normal           +---------+------------------+-----+--------+--------+ +---------+------------------+-----+----------+-------+ Left     Lt Pressure (mmHg)IndexWaveform  Comment +---------+------------------+-----+----------+-------+ Brachial 160                                      +---------+------------------+-----+----------+-------+ ATA      137               0.86 monophasic        +---------+------------------+-----+----------+-------+ PTA      126               0.79 biphasic          +---------+------------------+-----+----------+-------+ Great Toe97                0.61 Abnormal          +---------+------------------+-----+----------+-------+ +-------+-----------+-----------+------------+------------+ ABI/TBIToday's ABIToday's TBIPrevious ABIPrevious TBI +-------+-----------+-----------+------------+------------+ Right  1.01       .73        .17         .08          +-------+-----------+-----------+------------+------------+ Left   .86        .79        .73         .41          +-------+-----------+-----------+------------+------------+ Right ABIs and TBIs appear increased compared to prior study on 04/23/2022.  Summary: Right: Resting right ankle-brachial index is within  normal range. The right toe-brachial index is normal. Left: Resting left ankle-brachial index indicates mild left lower extremity arterial disease. The left toe-brachial index is abnormal. *See table(s) above for measurements and observations.  Electronically signed by [name] MD on 04/25/2022 at 2:48:33 PM.    Final        Assessment & Plan:   1. Peripheral arterial disease with history of revascularization (HCC) Recommend:  The patient is status post successful angiogram with intervention.  The patient reports that the claudication symptoms and leg pain has improved.   The patient denies lifestyle limiting changes at this point in time.  No further invasive studies, angiography or surgery at this time The patient should continue walking and begin a more formal exercise program.  The patient should continue antiplatelet therapy and aggressive treatment of the lipid abnormalities  Continued surveillance is indicated as atherosclerosis is likely to progress with time.    Patient should undergo noninvasive studies as ordered. The patient will follow up with me to review the studies.   2. Benign essential HTN Continue antihypertensive medications as already ordered, these medications have been reviewed and there are no changes at this time.  3. Hyperlipidemia, mixed Continue statin as ordered and reviewed, no changes at this time   Current Outpatient Medications on File Prior to Visit  Medication Sig Dispense Refill   aspirin EC 81 MG tablet Take 81 mg by mouth daily. Swallow whole.     atorvastatin (LIPITOR) 10 MG tablet Take 10 mg by mouth daily.     cholecalciferol (VITAMIN D3) 25 MCG (1000 UNIT) tablet Take 1,000 Units by mouth daily.     HYDROcodone-acetaminophen (NORCO) 5-325 MG tablet Take 1 tablet by mouth every 6 (six) hours as needed for moderate pain. 30 tablet 0   metoprolol tartrate (LOPRESSOR) 25 MG tablet Take 1 tablet (25 mg total) by mouth 2 (two) times daily. 60  tablet 3   rivaroxaban (XARELTO) 20 MG  TABS tablet Take 1 tablet (20 mg total) by mouth daily. 30 tablet 3   amLODipine (NORVASC) 10 MG tablet Take 1 tablet by mouth once daily 30 tablet 0   No current facility-administered medications on file prior to visit.    There are no Patient Instructions on file for this visit. No follow-ups on file.   Georgiana Spinner, NP

## 2022-06-11 ENCOUNTER — Other Ambulatory Visit (INDEPENDENT_AMBULATORY_CARE_PROVIDER_SITE_OTHER): Payer: Self-pay | Admitting: Nurse Practitioner

## 2022-06-15 DIAGNOSIS — Z79899 Other long term (current) drug therapy: Secondary | ICD-10-CM | POA: Diagnosis not present

## 2022-06-26 DIAGNOSIS — Z1211 Encounter for screening for malignant neoplasm of colon: Secondary | ICD-10-CM | POA: Diagnosis not present

## 2022-07-18 ENCOUNTER — Other Ambulatory Visit (INDEPENDENT_AMBULATORY_CARE_PROVIDER_SITE_OTHER): Payer: Self-pay | Admitting: Nurse Practitioner

## 2022-08-02 ENCOUNTER — Other Ambulatory Visit (INDEPENDENT_AMBULATORY_CARE_PROVIDER_SITE_OTHER): Payer: Self-pay | Admitting: Nurse Practitioner

## 2022-08-02 DIAGNOSIS — Z9889 Other specified postprocedural states: Secondary | ICD-10-CM

## 2022-08-03 ENCOUNTER — Ambulatory Visit (INDEPENDENT_AMBULATORY_CARE_PROVIDER_SITE_OTHER): Payer: Medicare Other | Admitting: Nurse Practitioner

## 2022-08-03 ENCOUNTER — Encounter (INDEPENDENT_AMBULATORY_CARE_PROVIDER_SITE_OTHER): Payer: Medicare Other

## 2022-08-30 ENCOUNTER — Other Ambulatory Visit: Payer: Self-pay | Admitting: Family Medicine

## 2022-08-30 DIAGNOSIS — Z1231 Encounter for screening mammogram for malignant neoplasm of breast: Secondary | ICD-10-CM

## 2022-09-11 IMAGING — CR DG CHEST 2V
1 series · 2 of 2 positions shown · non-contrast
Comparison: None.

CLINICAL DATA: Shortness of breath

EXAM:
CHEST - 2 VIEW

[Series 2: w chest pa · 0.14mm/px · 2 of 2 slices shown]
[im 1/2]
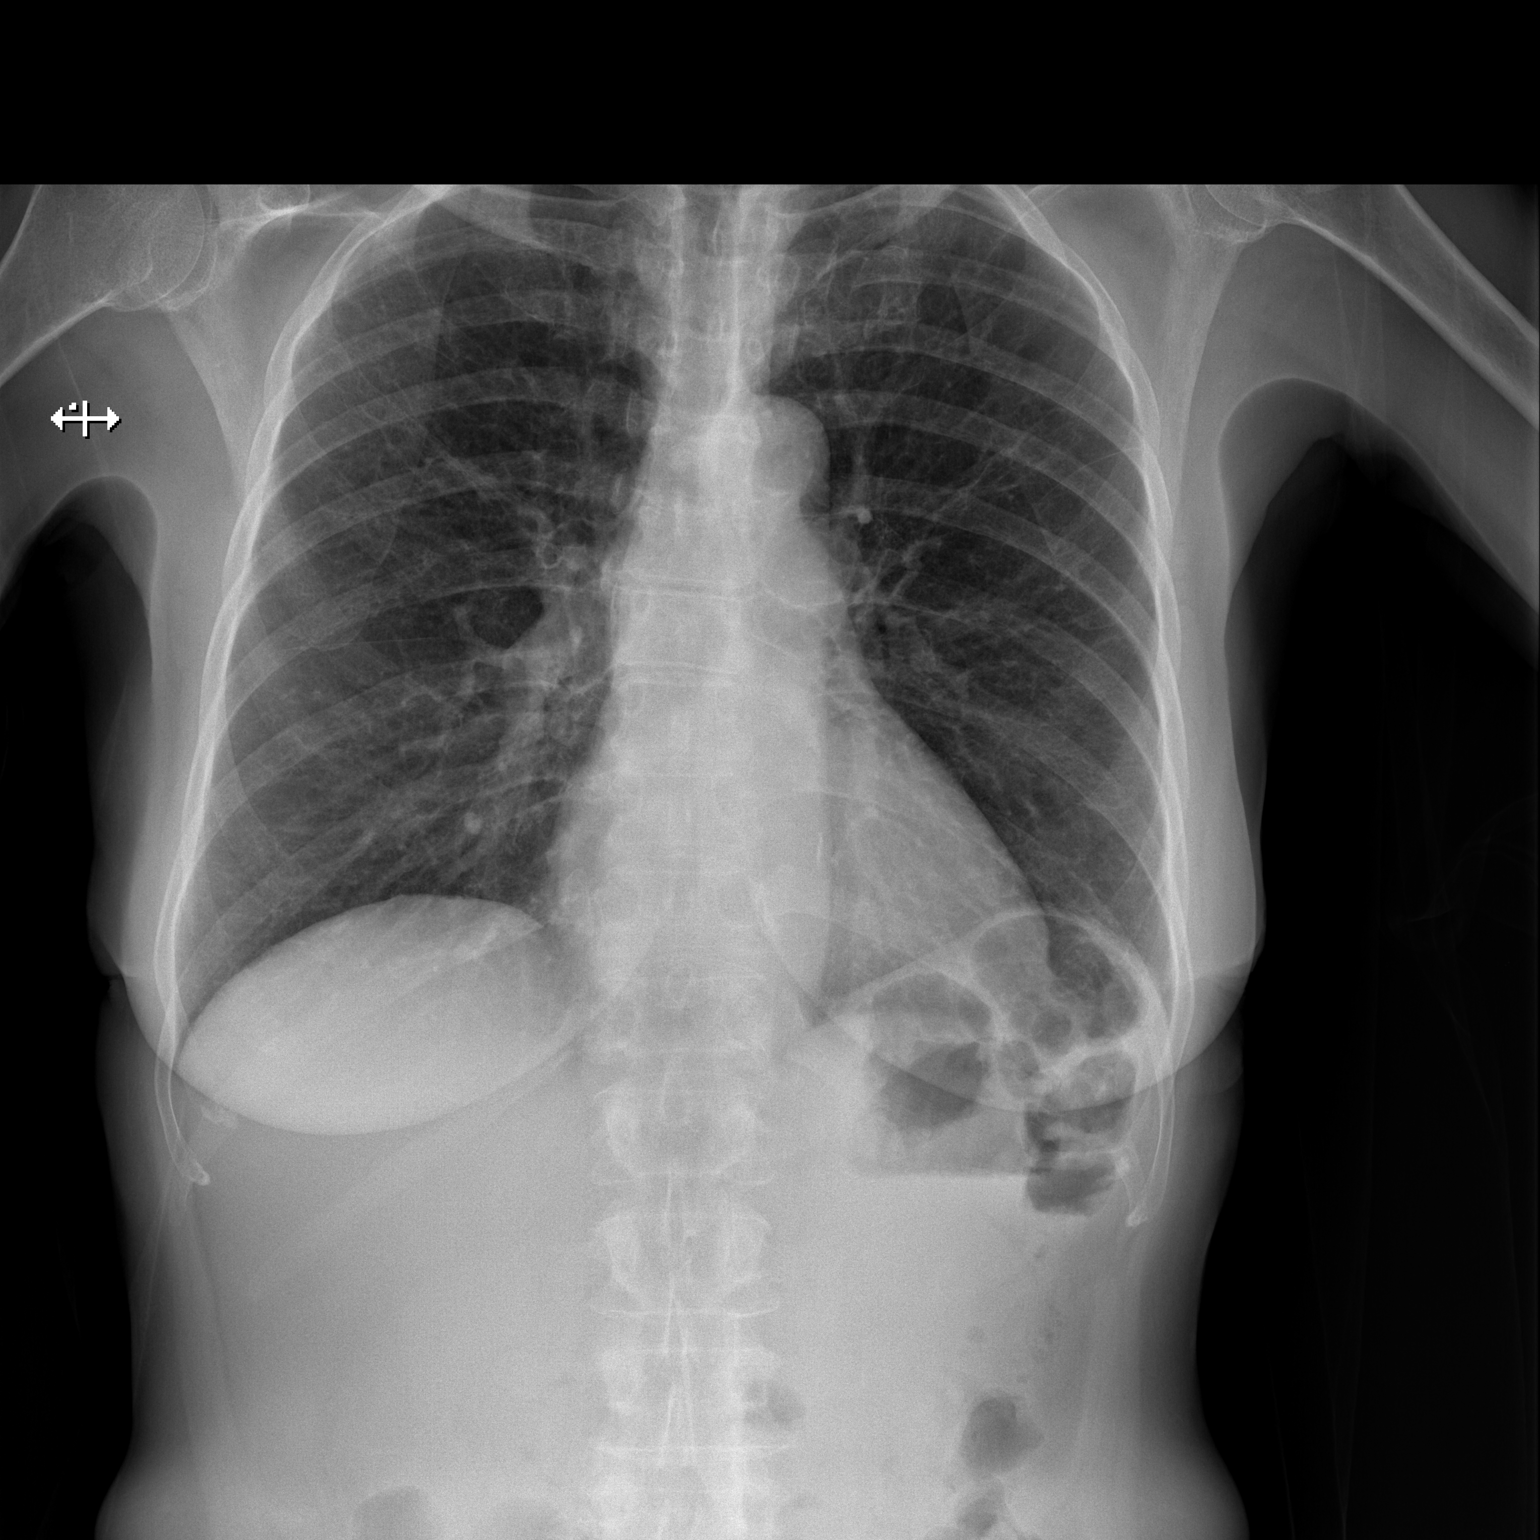
[im 2/2]
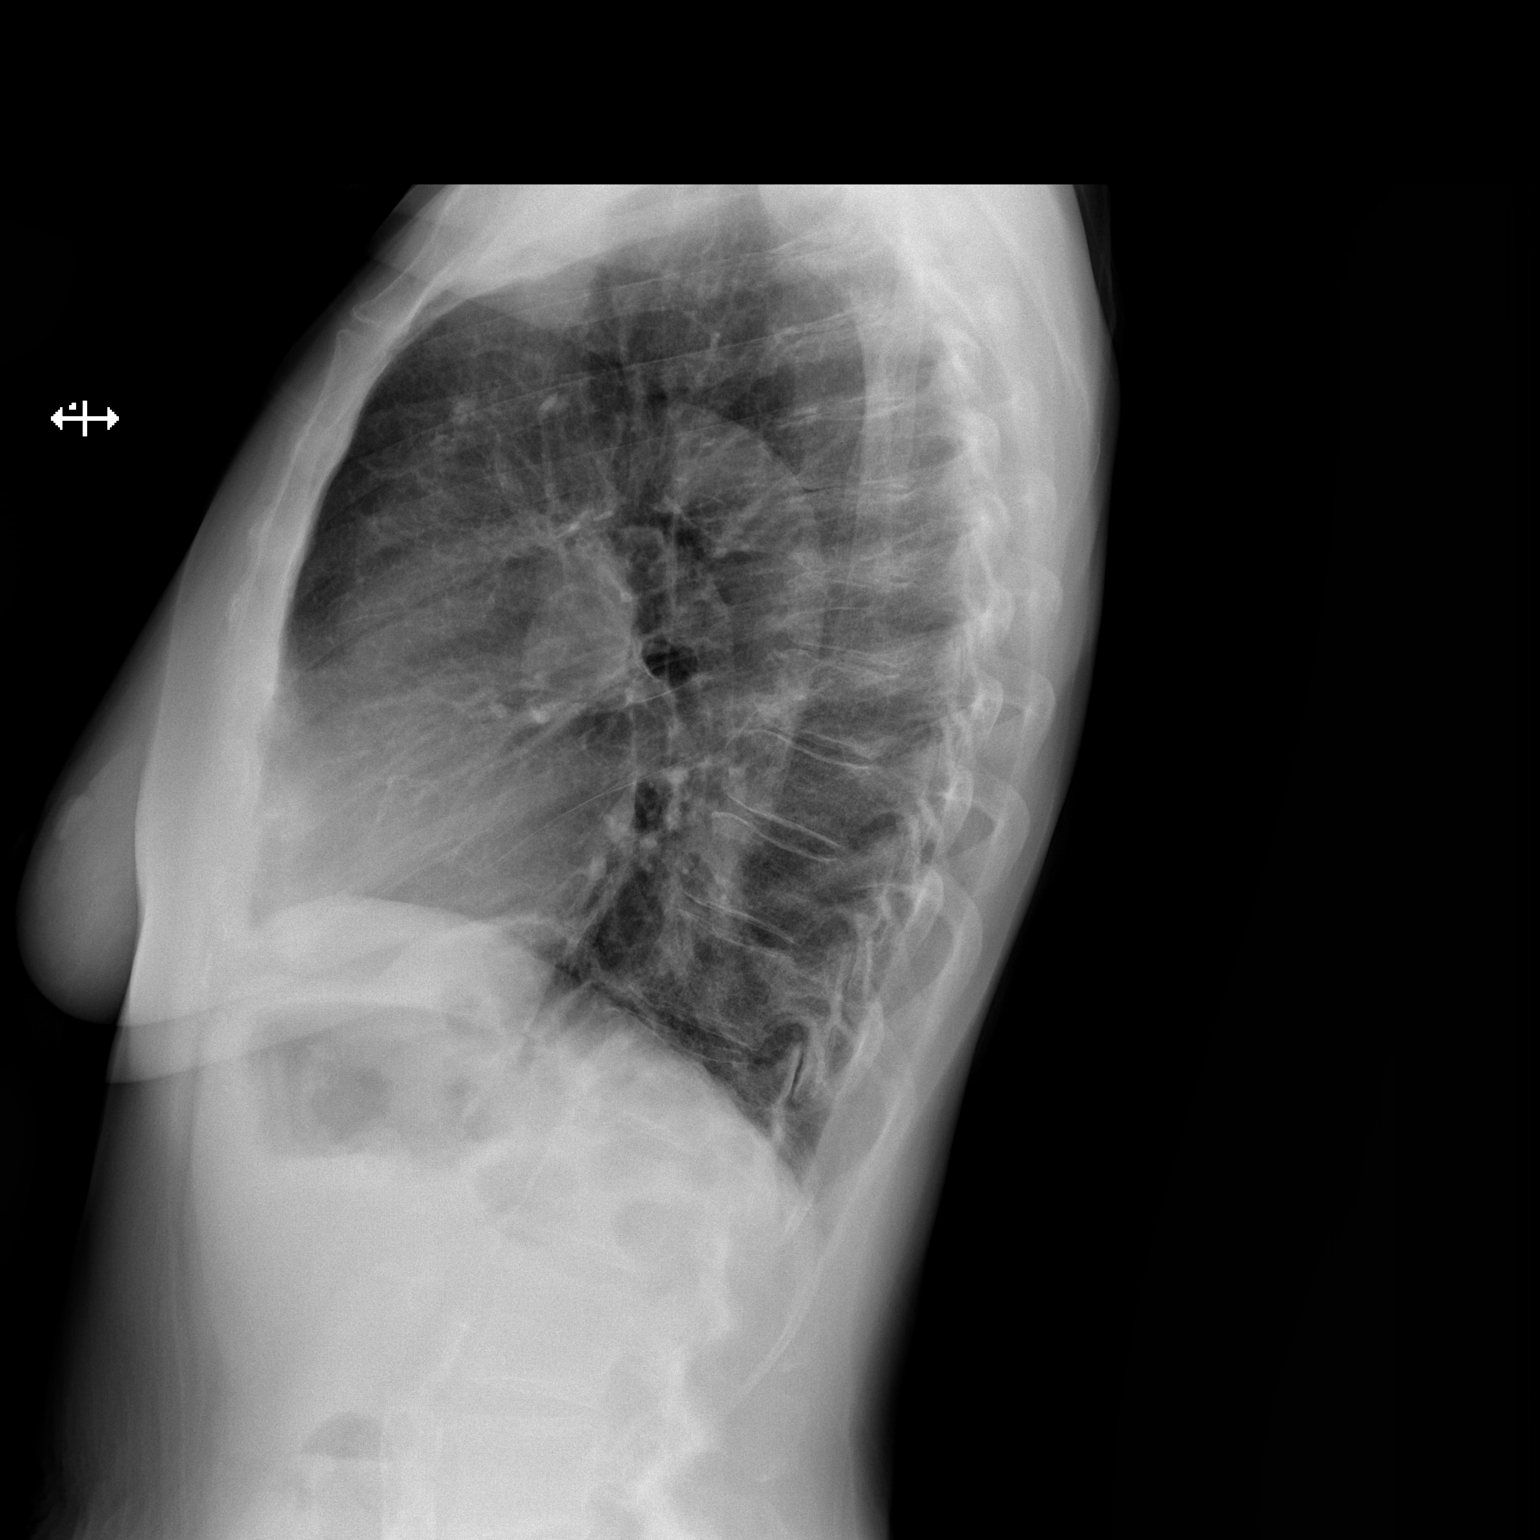

[2 of 2 positions shown; findings below may reference images not displayed]

FINDINGS: The heart size and mediastinal contours are within normal limits.
Both lungs are clear. Disc degenerative disease of the thoracic
spine.
IMPRESSION: No acute abnormality of the lungs.

## 2022-09-11 IMAGING — CT CT RENAL STONE PROTOCOL
2 of 4 series · 15 of 46 positions shown, 17 images · non-contrast
Comparison: None.

CLINICAL DATA: Flank pain

EXAM:
CT ABDOMEN AND PELVIS WITHOUT CONTRAST
TECHNIQUE: Multidetector CT imaging of the abdomen and pelvis was performed
following the standard protocol without IV contrast.

[Series 2: stone full standard · axial · 0.69mm/px · z∈[-408,-34]mm · 12 of 86 slices shown, 14 images]
[im 7/86  soft-tissue]
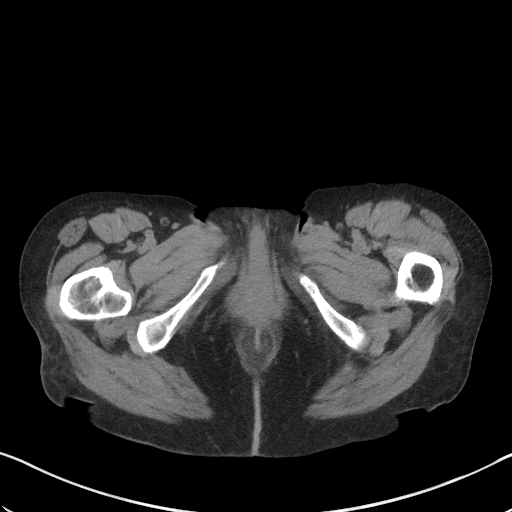
[im 7/86  bone]
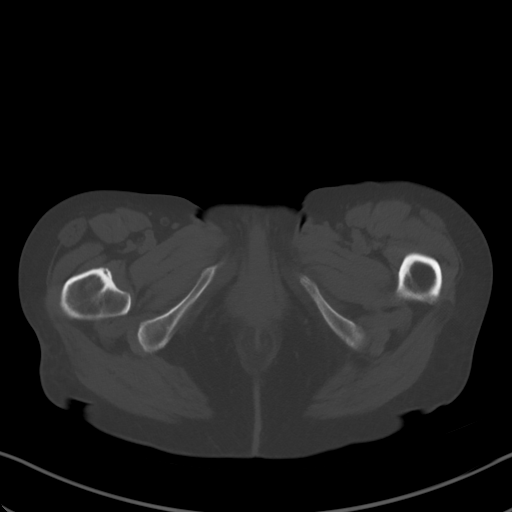
[im 14/86  soft-tissue]
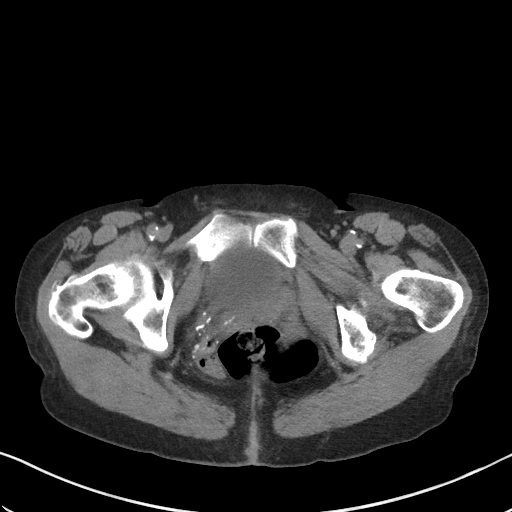
[im 21/86  soft-tissue]
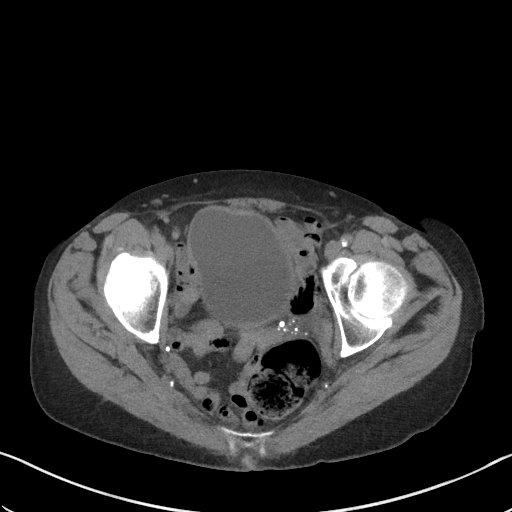
[im 28/86  soft-tissue]
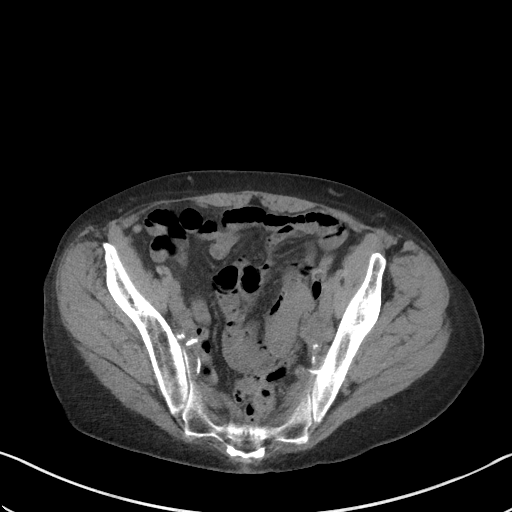
[im 35/86  soft-tissue]
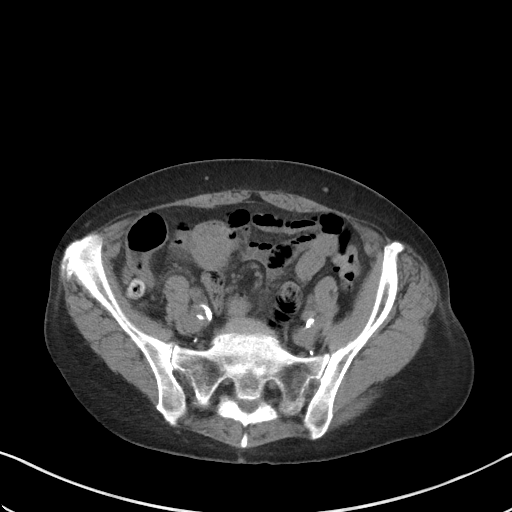
[im 41/86  soft-tissue]
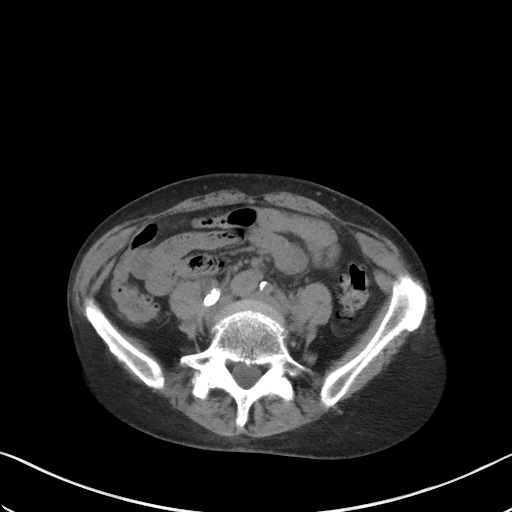
[im 48/86  soft-tissue]
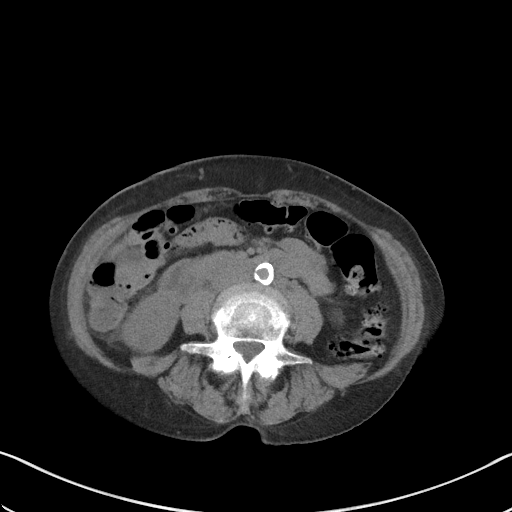
[im 55/86  soft-tissue]
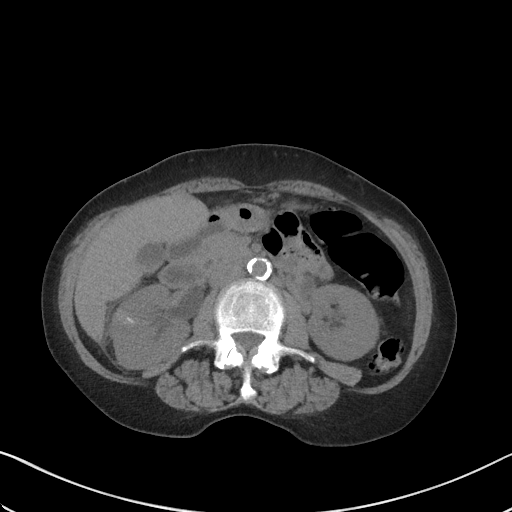
[im 62/86  soft-tissue]
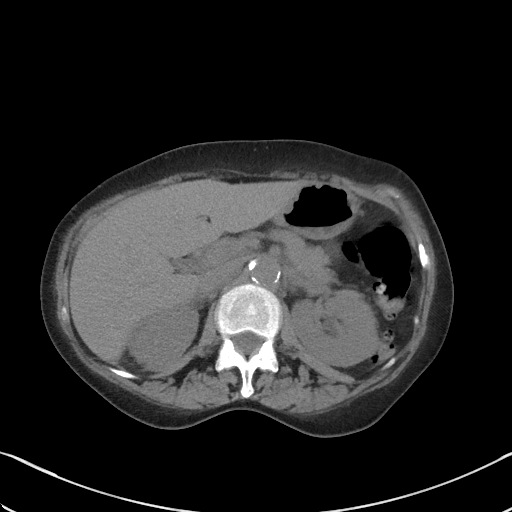
[im 62/86  bone]
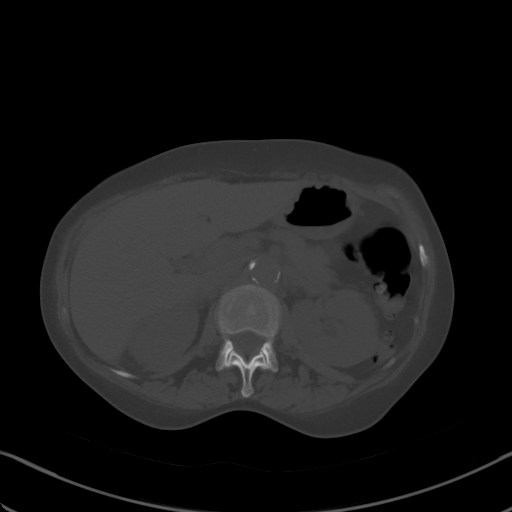
[im 69/86  soft-tissue]
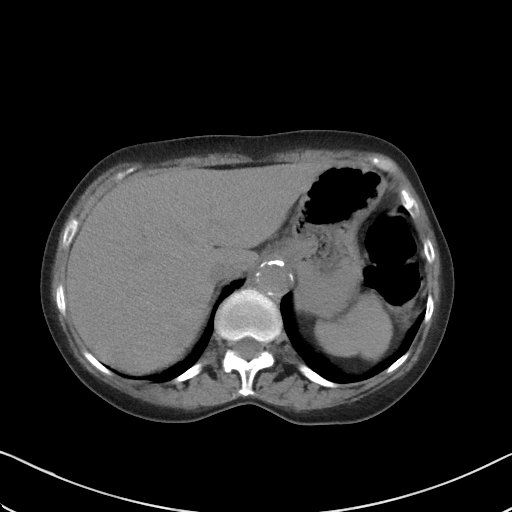
[im 75/86  soft-tissue]
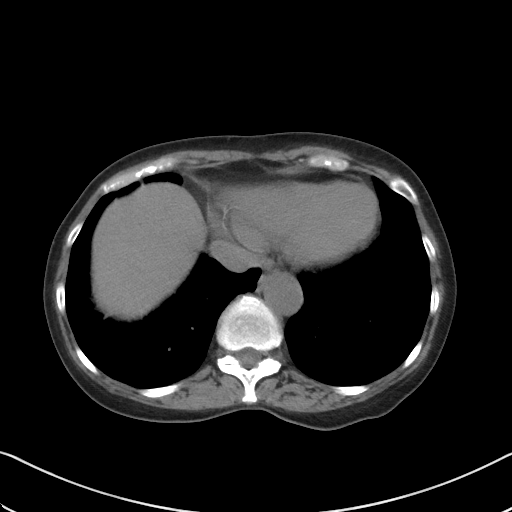
[im 82/86  soft-tissue]
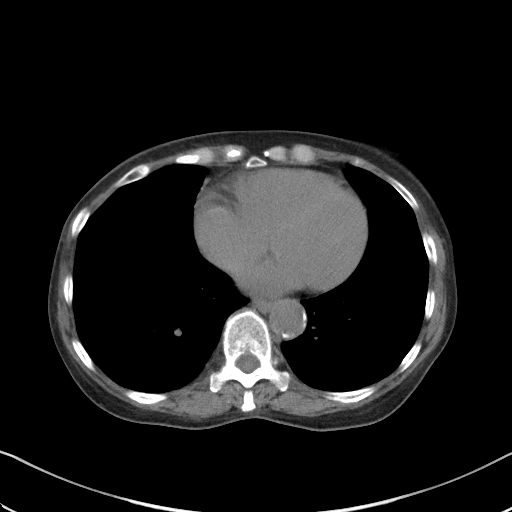

[Series 5: coronal · coronal · 0.61mm/px · 3 of 127 slices shown]
[im 43/127  soft-tissue]
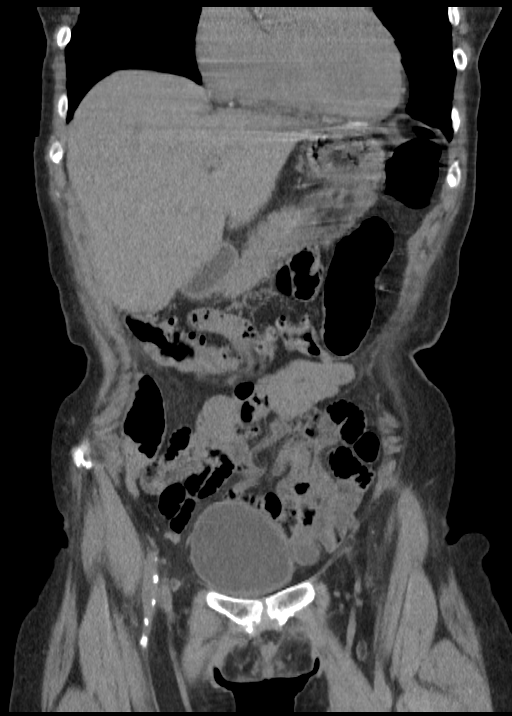
[im 57/127  soft-tissue]
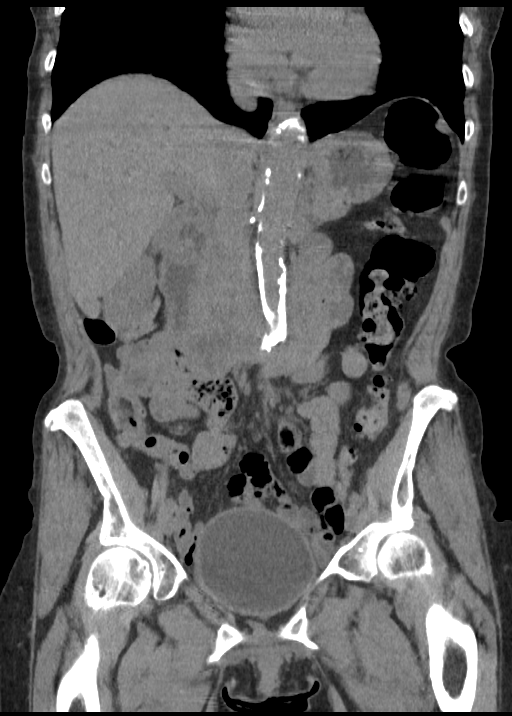
[im 71/127  soft-tissue]
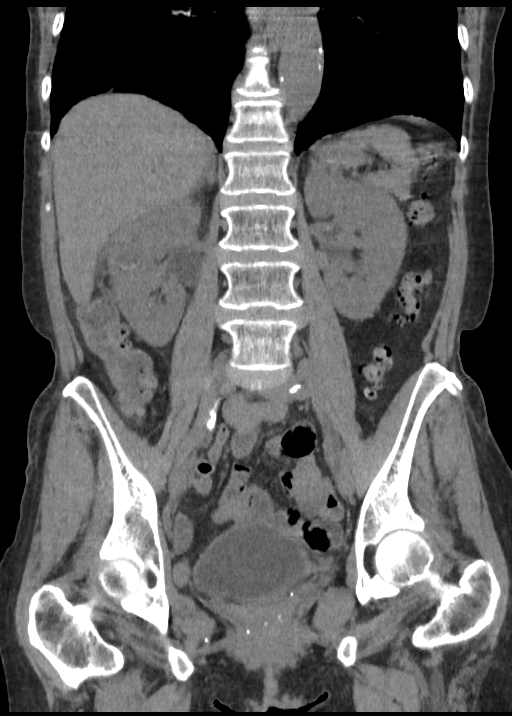

[15 of 46 positions shown; findings below may reference images not displayed]

FINDINGS: Lower chest: No acute abnormality.

Hepatobiliary: No focal liver abnormality is seen. No gallstones,
gallbladder wall thickening, or biliary dilatation.

Pancreas: Unremarkable. No pancreatic ductal dilatation or
surrounding inflammatory changes.

Spleen: Normal in size without focal abnormality.

Adrenals/Urinary Tract: Bilateral adrenal glands unremarkable.
Punctate nonobstructing stone of the left kidney. Mild right greater
than left hydronephrosis and hydroureter with asymmetric fat
stranding about the right kidney. Ill-defined peripherally calcified
low-attenuation areas of the interpolar right kidney. Mildly
thick-walled urinary bladder.

Stomach/Bowel: Stomach is within normal limits. Appendix appears
normal. Diverticulosis. No evidence of bowel wall thickening,
distention, or inflammatory changes.

Vascular/Lymphatic: Aortic atherosclerosis. No enlarged abdominal or
pelvic lymph nodes.

Reproductive: Status post hysterectomy. No adnexal masses.

Other: No abdominal wall hernia or abnormality. No abdominopelvic
ascites.

Musculoskeletal: Mild grade 1 anterolisthesis of L4 and L5, likely
degenerative. No acute osseous abnormality.
IMPRESSION: Asymmetric fat stranding about the right kidney with mild right
greater than left hydronephrosis and hydroureter. No obstructing
stone visualized. Findings are concerning for pyelonephritis.

Ill-defined peripherally calcified low-attenuation area of the
interpolar right kidney, possibly a simple renal cyst or calyceal
diverticulum, although superinfection cannot be excluded.
Contrast-enhanced renal protocol CT or MRI could be performed for
further evaluation.

Mildly thick-walled urinary bladder, findings can be seen in the
setting of cystitis.

Aortic Atherosclerosis ([id number]).

## 2022-09-19 ENCOUNTER — Inpatient Hospital Stay: Admission: RE | Admit: 2022-09-19 | Payer: Self-pay | Source: Ambulatory Visit

## 2022-10-02 ENCOUNTER — Ambulatory Visit
Admission: RE | Admit: 2022-10-02 | Discharge: 2022-10-02 | Disposition: A | Discharge status: Home / Self Care | Payer: Medicare HMO | Source: Ambulatory Visit | Attending: Family Medicine | Admitting: Family Medicine

## 2022-10-02 DIAGNOSIS — Z1231 Encounter for screening mammogram for malignant neoplasm of breast: Secondary | ICD-10-CM | POA: Insufficient documentation

## 2022-12-07 DIAGNOSIS — I10 Essential (primary) hypertension: Secondary | ICD-10-CM | POA: Diagnosis not present

## 2022-12-12 ENCOUNTER — Ambulatory Visit (INDEPENDENT_AMBULATORY_CARE_PROVIDER_SITE_OTHER): Payer: Medicare HMO | Admitting: Nurse Practitioner

## 2022-12-12 ENCOUNTER — Encounter (INDEPENDENT_AMBULATORY_CARE_PROVIDER_SITE_OTHER): Payer: Medicare PPO

## 2022-12-25 ENCOUNTER — Other Ambulatory Visit (HOSPITAL_COMMUNITY): Payer: Self-pay | Admitting: Family Medicine

## 2022-12-25 DIAGNOSIS — Z122 Encounter for screening for malignant neoplasm of respiratory organs: Secondary | ICD-10-CM

## 2022-12-27 ENCOUNTER — Encounter (INDEPENDENT_AMBULATORY_CARE_PROVIDER_SITE_OTHER): Payer: Medicare PPO

## 2022-12-27 ENCOUNTER — Ambulatory Visit (INDEPENDENT_AMBULATORY_CARE_PROVIDER_SITE_OTHER): Payer: Medicare HMO | Admitting: Nurse Practitioner

## 2023-01-09 ENCOUNTER — Ambulatory Visit
Admission: RE | Admit: 2023-01-09 | Discharge: 2023-01-09 | Disposition: A | Discharge status: Home / Self Care | Payer: Medicare HMO | Source: Ambulatory Visit | Attending: Family Medicine | Admitting: Family Medicine

## 2023-01-09 DIAGNOSIS — Z122 Encounter for screening for malignant neoplasm of respiratory organs: Secondary | ICD-10-CM | POA: Insufficient documentation

## 2023-01-09 DIAGNOSIS — Z87891 Personal history of nicotine dependence: Secondary | ICD-10-CM | POA: Insufficient documentation

## 2023-01-25 ENCOUNTER — Other Ambulatory Visit: Payer: Self-pay | Admitting: Family Medicine

## 2023-01-25 DIAGNOSIS — Z13820 Encounter for screening for osteoporosis: Secondary | ICD-10-CM

## 2023-01-29 ENCOUNTER — Ambulatory Visit (INDEPENDENT_AMBULATORY_CARE_PROVIDER_SITE_OTHER): Payer: Medicare HMO

## 2023-01-29 DIAGNOSIS — Z9889 Other specified postprocedural states: Secondary | ICD-10-CM

## 2023-01-29 DIAGNOSIS — I739 Peripheral vascular disease, unspecified: Secondary | ICD-10-CM

## 2023-01-31 LAB — VAS US ABI WITH/WO TBI
Left ABI: 0.65
Right ABI: 0.41

## 2023-02-04 ENCOUNTER — Ambulatory Visit (INDEPENDENT_AMBULATORY_CARE_PROVIDER_SITE_OTHER): Payer: Medicare HMO | Admitting: Nurse Practitioner

## 2023-02-11 ENCOUNTER — Encounter (INDEPENDENT_AMBULATORY_CARE_PROVIDER_SITE_OTHER): Payer: Self-pay | Admitting: Nurse Practitioner

## 2023-02-11 ENCOUNTER — Ambulatory Visit (INDEPENDENT_AMBULATORY_CARE_PROVIDER_SITE_OTHER): Payer: Medicare HMO | Admitting: Nurse Practitioner

## 2023-02-11 VITALS — BP 158/87 | HR 66 | Resp 16 | Wt 126.8 lb

## 2023-02-11 DIAGNOSIS — I70239 Atherosclerosis of native arteries of right leg with ulceration of unspecified site: Secondary | ICD-10-CM | POA: Diagnosis not present

## 2023-02-11 DIAGNOSIS — F17200 Nicotine dependence, unspecified, uncomplicated: Secondary | ICD-10-CM | POA: Diagnosis not present

## 2023-02-11 DIAGNOSIS — I70249 Atherosclerosis of native arteries of left leg with ulceration of unspecified site: Secondary | ICD-10-CM

## 2023-02-11 DIAGNOSIS — I10 Essential (primary) hypertension: Secondary | ICD-10-CM

## 2023-02-11 DIAGNOSIS — E782 Mixed hyperlipidemia: Secondary | ICD-10-CM | POA: Diagnosis not present

## 2023-02-11 MED ORDER — HYDROCODONE-ACETAMINOPHEN 5-325 MG PO TABS: 1.0000 | ORAL_TABLET | Freq: Four times a day (QID) | ORAL | 0 refills | Status: DC | PRN

## 2023-02-11 NOTE — H&P (View-Only) (Signed)
Accession #:    [id number] Date of Birth: 05-May-1956   Patient Gender: F Patient Age:   67 years Exam Location:  [facility] Procedure:      VAS [place] ABI WITH/WO TBI Referring Phys: [name] --------------------------------------------------------------------------------  Indications: Rest pain, and peripheral artery disease. High Risk Factors: Hypertension, hyperlipidemia, past history of smoking.  Vascular Interventions: 03/01/2022 Percutaneous transluminal angioplasty of the                         right peroneal artery and tibioperoneal trunk with 2.5                         mm diameter angioplasty balloon                         3. Percutaneous transluminal angioplasty of the right                         distal popliteal artery and tibioperoneal trunk with a 3                         mm diameter angioplasty balloon                         10/27/2021 rt thrombectomy plus new popliteal stent                         10/02/21: Right SFA/popliteal stent with TP trunk/peroneal                         PTAs;                         10/09/21: Left SFA stent with popliteal/TP                         trunk//ATA/peroneal PTAs;. Performing Technologist: Hardie Lora RVT  Examination Guidelines: A complete evaluation includes at minimum, Doppler waveform signals and systolic blood pressure reading at the level of bilateral brachial, anterior tibial, and posterior tibial arteries, when vessel segments are accessible. Bilateral testing is considered an integral part of a complete examination. Photoelectric Plethysmograph (PPG) waveforms and toe systolic pressure readings are included as required and additional duplex testing as needed. Limited examinations for reoccurring indications may be performed as noted.  ABI Findings:  +---------+------------------+-----+----------+--------+ Right    Rt Pressure (mmHg)IndexWaveform  Comment  +---------+------------------+-----+----------+--------+ Brachial 148                                       +---------+------------------+-----+----------+--------+ PTA      0                 0.00 absent             +---------+------------------+-----+----------+--------+ PERO     53                0.35 monophasic         +---------+------------------+-----+----------+--------+ DP       62                0.41 monophasic         +---------+------------------+-----+----------+--------+  Subjective:    Patient ID: Yvonne [name], female    DOB: September 07, 1955, 67 y.o.   MRN: [id number] Chief Complaint  Patient presents with   Follow-up    Ultrasound follow up    Yvonne [name] is a 67 year old female who presents today for evaluation of worsening of her peripheral arterial disease.  She notes that she began to have worsening pain and discomfort in her right leg and she is unable to sleep or walk any significant distance.  She notes that there is also been discoloration of her toes and she is also developed an ulceration on her fifth toe.  She notes that the pain that she has had is reminiscent of previous ischemic events that she has had.  She also endorses that the left lower extremity which has never had intervention has recently begun to have worsening claudication symptoms as well as some early rest pain as well.  Noninvasive studies show an ABI 0.41 on the right and 0.65 on the left.  The TBI on the right is 0 and TBI on the left is 0.37.  Previous studies done on 04/23/2022 show 1.01 on the right and 0.86 on the left.  The patient additionally had arterial duplex studies done today which shows occlusion of her SFA on the right.  The left shows monophasic with calcified plaque throughout the left lower extremity    Review of Systems  Cardiovascular:        Rest pain  Skin:  Positive for color change and wound.  All other systems reviewed and are negative.      Objective:   Physical Exam Vitals reviewed.  HENT:     Head: Normocephalic.  Cardiovascular:     Rate and Rhythm: Normal rate.     Pulses:          Dorsalis pedis pulses are 0 on the right side and detected w/ Doppler on the left side.       Posterior tibial pulses are 0 on the right side and detected w/ Doppler on the left side.  Pulmonary:     Effort: Pulmonary effort is normal.  Skin:    General: Skin is warm and dry.  Neurological:     Mental Status: She is alert and oriented to person, place, and time.      Gait: Gait abnormal.  Psychiatric:        Mood and Affect: Mood normal.        Behavior: Behavior normal.        Thought Content: Thought content normal.        Judgment: Judgment normal.     BP (!) 158/87 (BP Location: Right Arm)   Pulse 66   Resp 16   Wt 126 lb 12.8 oz (57.5 kg)   BMI 22.46 kg/m   Past Medical History:  Diagnosis Date   Hyperlipidemia    Hypertension    Peripheral vascular disease (HCC)    Syncope     Social History   Socioeconomic History   Marital status: Divorced    Spouse name: Not on file   Number of children: 2   Years of education: Not on file   Highest education level: Not on file  Occupational History   Not on file  Tobacco Use   Smoking status: Former    Current packs/day: 0.00    Types: Cigarettes    Quit date: 10/27/2021    Years since quitting: 1.2   Smokeless tobacco: Never  Substance  Accession #:    [id number] Date of Birth: 05-May-1956   Patient Gender: F Patient Age:   67 years Exam Location:  [facility] Procedure:      VAS [place] ABI WITH/WO TBI Referring Phys: [name] --------------------------------------------------------------------------------  Indications: Rest pain, and peripheral artery disease. High Risk Factors: Hypertension, hyperlipidemia, past history of smoking.  Vascular Interventions: 03/01/2022 Percutaneous transluminal angioplasty of the                         right peroneal artery and tibioperoneal trunk with 2.5                         mm diameter angioplasty balloon                         3. Percutaneous transluminal angioplasty of the right                         distal popliteal artery and tibioperoneal trunk with a 3                         mm diameter angioplasty balloon                         10/27/2021 rt thrombectomy plus new popliteal stent                         10/02/21: Right SFA/popliteal stent with TP trunk/peroneal                         PTAs;                         10/09/21: Left SFA stent with popliteal/TP                         trunk//ATA/peroneal PTAs;. Performing Technologist: Hardie Lora RVT  Examination Guidelines: A complete evaluation includes at minimum, Doppler waveform signals and systolic blood pressure reading at the level of bilateral brachial, anterior tibial, and posterior tibial arteries, when vessel segments are accessible. Bilateral testing is considered an integral part of a complete examination. Photoelectric Plethysmograph (PPG) waveforms and toe systolic pressure readings are included as required and additional duplex testing as needed. Limited examinations for reoccurring indications may be performed as noted.  ABI Findings:  +---------+------------------+-----+----------+--------+ Right    Rt Pressure (mmHg)IndexWaveform  Comment  +---------+------------------+-----+----------+--------+ Brachial 148                                       +---------+------------------+-----+----------+--------+ PTA      0                 0.00 absent             +---------+------------------+-----+----------+--------+ PERO     53                0.35 monophasic         +---------+------------------+-----+----------+--------+ DP       62                0.41 monophasic         +---------+------------------+-----+----------+--------+  Subjective:    Patient ID: Yvonne [name], female    DOB: September 07, 1955, 67 y.o.   MRN: [id number] Chief Complaint  Patient presents with   Follow-up    Ultrasound follow up    Yvonne [name] is a 67 year old female who presents today for evaluation of worsening of her peripheral arterial disease.  She notes that she began to have worsening pain and discomfort in her right leg and she is unable to sleep or walk any significant distance.  She notes that there is also been discoloration of her toes and she is also developed an ulceration on her fifth toe.  She notes that the pain that she has had is reminiscent of previous ischemic events that she has had.  She also endorses that the left lower extremity which has never had intervention has recently begun to have worsening claudication symptoms as well as some early rest pain as well.  Noninvasive studies show an ABI 0.41 on the right and 0.65 on the left.  The TBI on the right is 0 and TBI on the left is 0.37.  Previous studies done on 04/23/2022 show 1.01 on the right and 0.86 on the left.  The patient additionally had arterial duplex studies done today which shows occlusion of her SFA on the right.  The left shows monophasic with calcified plaque throughout the left lower extremity    Review of Systems  Cardiovascular:        Rest pain  Skin:  Positive for color change and wound.  All other systems reviewed and are negative.      Objective:   Physical Exam Vitals reviewed.  HENT:     Head: Normocephalic.  Cardiovascular:     Rate and Rhythm: Normal rate.     Pulses:          Dorsalis pedis pulses are 0 on the right side and detected w/ Doppler on the left side.       Posterior tibial pulses are 0 on the right side and detected w/ Doppler on the left side.  Pulmonary:     Effort: Pulmonary effort is normal.  Skin:    General: Skin is warm and dry.  Neurological:     Mental Status: She is alert and oriented to person, place, and time.      Gait: Gait abnormal.  Psychiatric:        Mood and Affect: Mood normal.        Behavior: Behavior normal.        Thought Content: Thought content normal.        Judgment: Judgment normal.     BP (!) 158/87 (BP Location: Right Arm)   Pulse 66   Resp 16   Wt 126 lb 12.8 oz (57.5 kg)   BMI 22.46 kg/m   Past Medical History:  Diagnosis Date   Hyperlipidemia    Hypertension    Peripheral vascular disease (HCC)    Syncope     Social History   Socioeconomic History   Marital status: Divorced    Spouse name: Not on file   Number of children: 2   Years of education: Not on file   Highest education level: Not on file  Occupational History   Not on file  Tobacco Use   Smoking status: Former    Current packs/day: 0.00    Types: Cigarettes    Quit date: 10/27/2021    Years since quitting: 1.2   Smokeless tobacco: Never  Substance  Accession #:    [id number] Date of Birth: 05-May-1956   Patient Gender: F Patient Age:   67 years Exam Location:  [facility] Procedure:      VAS [place] ABI WITH/WO TBI Referring Phys: [name] --------------------------------------------------------------------------------  Indications: Rest pain, and peripheral artery disease. High Risk Factors: Hypertension, hyperlipidemia, past history of smoking.  Vascular Interventions: 03/01/2022 Percutaneous transluminal angioplasty of the                         right peroneal artery and tibioperoneal trunk with 2.5                         mm diameter angioplasty balloon                         3. Percutaneous transluminal angioplasty of the right                         distal popliteal artery and tibioperoneal trunk with a 3                         mm diameter angioplasty balloon                         10/27/2021 rt thrombectomy plus new popliteal stent                         10/02/21: Right SFA/popliteal stent with TP trunk/peroneal                         PTAs;                         10/09/21: Left SFA stent with popliteal/TP                         trunk//ATA/peroneal PTAs;. Performing Technologist: Hardie Lora RVT  Examination Guidelines: A complete evaluation includes at minimum, Doppler waveform signals and systolic blood pressure reading at the level of bilateral brachial, anterior tibial, and posterior tibial arteries, when vessel segments are accessible. Bilateral testing is considered an integral part of a complete examination. Photoelectric Plethysmograph (PPG) waveforms and toe systolic pressure readings are included as required and additional duplex testing as needed. Limited examinations for reoccurring indications may be performed as noted.  ABI Findings:  +---------+------------------+-----+----------+--------+ Right    Rt Pressure (mmHg)IndexWaveform  Comment  +---------+------------------+-----+----------+--------+ Brachial 148                                       +---------+------------------+-----+----------+--------+ PTA      0                 0.00 absent             +---------+------------------+-----+----------+--------+ PERO     53                0.35 monophasic         +---------+------------------+-----+----------+--------+ DP       62                0.41 monophasic         +---------+------------------+-----+----------+--------+

## 2023-02-11 NOTE — Progress Notes (Signed)
Accession #:    [id number] Date of Birth: 05-May-1956   Patient Gender: F Patient Age:   67 years Exam Location:  [facility] Procedure:      VAS [place] ABI WITH/WO TBI Referring Phys: [name] --------------------------------------------------------------------------------  Indications: Rest pain, and peripheral artery disease. High Risk Factors: Hypertension, hyperlipidemia, past history of smoking.  Vascular Interventions: 03/01/2022 Percutaneous transluminal angioplasty of the                         right peroneal artery and tibioperoneal trunk with 2.5                         mm diameter angioplasty balloon                         3. Percutaneous transluminal angioplasty of the right                         distal popliteal artery and tibioperoneal trunk with a 3                         mm diameter angioplasty balloon                         10/27/2021 rt thrombectomy plus new popliteal stent                         10/02/21: Right SFA/popliteal stent with TP trunk/peroneal                         PTAs;                         10/09/21: Left SFA stent with popliteal/TP                         trunk//ATA/peroneal PTAs;. Performing Technologist: Hardie Lora RVT  Examination Guidelines: A complete evaluation includes at minimum, Doppler waveform signals and systolic blood pressure reading at the level of bilateral brachial, anterior tibial, and posterior tibial arteries, when vessel segments are accessible. Bilateral testing is considered an integral part of a complete examination. Photoelectric Plethysmograph (PPG) waveforms and toe systolic pressure readings are included as required and additional duplex testing as needed. Limited examinations for reoccurring indications may be performed as noted.  ABI Findings:  +---------+------------------+-----+----------+--------+ Right    Rt Pressure (mmHg)IndexWaveform  Comment  +---------+------------------+-----+----------+--------+ Brachial 148                                       +---------+------------------+-----+----------+--------+ PTA      0                 0.00 absent             +---------+------------------+-----+----------+--------+ PERO     53                0.35 monophasic         +---------+------------------+-----+----------+--------+ DP       62                0.41 monophasic         +---------+------------------+-----+----------+--------+  Subjective:    Patient ID: Yvonne [name], female    DOB: September 07, 1955, 67 y.o.   MRN: [id number] Chief Complaint  Patient presents with   Follow-up    Ultrasound follow up    Yvonne [name] is a 67 year old female who presents today for evaluation of worsening of her peripheral arterial disease.  She notes that she began to have worsening pain and discomfort in her right leg and she is unable to sleep or walk any significant distance.  She notes that there is also been discoloration of her toes and she is also developed an ulceration on her fifth toe.  She notes that the pain that she has had is reminiscent of previous ischemic events that she has had.  She also endorses that the left lower extremity which has never had intervention has recently begun to have worsening claudication symptoms as well as some early rest pain as well.  Noninvasive studies show an ABI 0.41 on the right and 0.65 on the left.  The TBI on the right is 0 and TBI on the left is 0.37.  Previous studies done on 04/23/2022 show 1.01 on the right and 0.86 on the left.  The patient additionally had arterial duplex studies done today which shows occlusion of her SFA on the right.  The left shows monophasic with calcified plaque throughout the left lower extremity    Review of Systems  Cardiovascular:        Rest pain  Skin:  Positive for color change and wound.  All other systems reviewed and are negative.      Objective:   Physical Exam Vitals reviewed.  HENT:     Head: Normocephalic.  Cardiovascular:     Rate and Rhythm: Normal rate.     Pulses:          Dorsalis pedis pulses are 0 on the right side and detected w/ Doppler on the left side.       Posterior tibial pulses are 0 on the right side and detected w/ Doppler on the left side.  Pulmonary:     Effort: Pulmonary effort is normal.  Skin:    General: Skin is warm and dry.  Neurological:     Mental Status: She is alert and oriented to person, place, and time.      Gait: Gait abnormal.  Psychiatric:        Mood and Affect: Mood normal.        Behavior: Behavior normal.        Thought Content: Thought content normal.        Judgment: Judgment normal.     BP (!) 158/87 (BP Location: Right Arm)   Pulse 66   Resp 16   Wt 126 lb 12.8 oz (57.5 kg)   BMI 22.46 kg/m   Past Medical History:  Diagnosis Date   Hyperlipidemia    Hypertension    Peripheral vascular disease (HCC)    Syncope     Social History   Socioeconomic History   Marital status: Divorced    Spouse name: Not on file   Number of children: 2   Years of education: Not on file   Highest education level: Not on file  Occupational History   Not on file  Tobacco Use   Smoking status: Former    Current packs/day: 0.00    Types: Cigarettes    Quit date: 10/27/2021    Years since quitting: 1.2   Smokeless tobacco: Never  Substance  Accession #:    [id number] Date of Birth: 05-May-1956   Patient Gender: F Patient Age:   67 years Exam Location:  [facility] Procedure:      VAS [place] ABI WITH/WO TBI Referring Phys: [name] --------------------------------------------------------------------------------  Indications: Rest pain, and peripheral artery disease. High Risk Factors: Hypertension, hyperlipidemia, past history of smoking.  Vascular Interventions: 03/01/2022 Percutaneous transluminal angioplasty of the                         right peroneal artery and tibioperoneal trunk with 2.5                         mm diameter angioplasty balloon                         3. Percutaneous transluminal angioplasty of the right                         distal popliteal artery and tibioperoneal trunk with a 3                         mm diameter angioplasty balloon                         10/27/2021 rt thrombectomy plus new popliteal stent                         10/02/21: Right SFA/popliteal stent with TP trunk/peroneal                         PTAs;                         10/09/21: Left SFA stent with popliteal/TP                         trunk//ATA/peroneal PTAs;. Performing Technologist: Hardie Lora RVT  Examination Guidelines: A complete evaluation includes at minimum, Doppler waveform signals and systolic blood pressure reading at the level of bilateral brachial, anterior tibial, and posterior tibial arteries, when vessel segments are accessible. Bilateral testing is considered an integral part of a complete examination. Photoelectric Plethysmograph (PPG) waveforms and toe systolic pressure readings are included as required and additional duplex testing as needed. Limited examinations for reoccurring indications may be performed as noted.  ABI Findings:  +---------+------------------+-----+----------+--------+ Right    Rt Pressure (mmHg)IndexWaveform  Comment  +---------+------------------+-----+----------+--------+ Brachial 148                                       +---------+------------------+-----+----------+--------+ PTA      0                 0.00 absent             +---------+------------------+-----+----------+--------+ PERO     53                0.35 monophasic         +---------+------------------+-----+----------+--------+ DP       62                0.41 monophasic         +---------+------------------+-----+----------+--------+  Subjective:    Patient ID: Yvonne [name], female    DOB: September 07, 1955, 67 y.o.   MRN: [id number] Chief Complaint  Patient presents with   Follow-up    Ultrasound follow up    Yvonne [name] is a 67 year old female who presents today for evaluation of worsening of her peripheral arterial disease.  She notes that she began to have worsening pain and discomfort in her right leg and she is unable to sleep or walk any significant distance.  She notes that there is also been discoloration of her toes and she is also developed an ulceration on her fifth toe.  She notes that the pain that she has had is reminiscent of previous ischemic events that she has had.  She also endorses that the left lower extremity which has never had intervention has recently begun to have worsening claudication symptoms as well as some early rest pain as well.  Noninvasive studies show an ABI 0.41 on the right and 0.65 on the left.  The TBI on the right is 0 and TBI on the left is 0.37.  Previous studies done on 04/23/2022 show 1.01 on the right and 0.86 on the left.  The patient additionally had arterial duplex studies done today which shows occlusion of her SFA on the right.  The left shows monophasic with calcified plaque throughout the left lower extremity    Review of Systems  Cardiovascular:        Rest pain  Skin:  Positive for color change and wound.  All other systems reviewed and are negative.      Objective:   Physical Exam Vitals reviewed.  HENT:     Head: Normocephalic.  Cardiovascular:     Rate and Rhythm: Normal rate.     Pulses:          Dorsalis pedis pulses are 0 on the right side and detected w/ Doppler on the left side.       Posterior tibial pulses are 0 on the right side and detected w/ Doppler on the left side.  Pulmonary:     Effort: Pulmonary effort is normal.  Skin:    General: Skin is warm and dry.  Neurological:     Mental Status: She is alert and oriented to person, place, and time.      Gait: Gait abnormal.  Psychiatric:        Mood and Affect: Mood normal.        Behavior: Behavior normal.        Thought Content: Thought content normal.        Judgment: Judgment normal.     BP (!) 158/87 (BP Location: Right Arm)   Pulse 66   Resp 16   Wt 126 lb 12.8 oz (57.5 kg)   BMI 22.46 kg/m   Past Medical History:  Diagnosis Date   Hyperlipidemia    Hypertension    Peripheral vascular disease (HCC)    Syncope     Social History   Socioeconomic History   Marital status: Divorced    Spouse name: Not on file   Number of children: 2   Years of education: Not on file   Highest education level: Not on file  Occupational History   Not on file  Tobacco Use   Smoking status: Former    Current packs/day: 0.00    Types: Cigarettes    Quit date: 10/27/2021    Years since quitting: 1.2   Smokeless tobacco: Never  Substance  Accession #:    [id number] Date of Birth: 05-May-1956   Patient Gender: F Patient Age:   67 years Exam Location:  [facility] Procedure:      VAS [place] ABI WITH/WO TBI Referring Phys: [name] --------------------------------------------------------------------------------  Indications: Rest pain, and peripheral artery disease. High Risk Factors: Hypertension, hyperlipidemia, past history of smoking.  Vascular Interventions: 03/01/2022 Percutaneous transluminal angioplasty of the                         right peroneal artery and tibioperoneal trunk with 2.5                         mm diameter angioplasty balloon                         3. Percutaneous transluminal angioplasty of the right                         distal popliteal artery and tibioperoneal trunk with a 3                         mm diameter angioplasty balloon                         10/27/2021 rt thrombectomy plus new popliteal stent                         10/02/21: Right SFA/popliteal stent with TP trunk/peroneal                         PTAs;                         10/09/21: Left SFA stent with popliteal/TP                         trunk//ATA/peroneal PTAs;. Performing Technologist: Hardie Lora RVT  Examination Guidelines: A complete evaluation includes at minimum, Doppler waveform signals and systolic blood pressure reading at the level of bilateral brachial, anterior tibial, and posterior tibial arteries, when vessel segments are accessible. Bilateral testing is considered an integral part of a complete examination. Photoelectric Plethysmograph (PPG) waveforms and toe systolic pressure readings are included as required and additional duplex testing as needed. Limited examinations for reoccurring indications may be performed as noted.  ABI Findings:  +---------+------------------+-----+----------+--------+ Right    Rt Pressure (mmHg)IndexWaveform  Comment  +---------+------------------+-----+----------+--------+ Brachial 148                                       +---------+------------------+-----+----------+--------+ PTA      0                 0.00 absent             +---------+------------------+-----+----------+--------+ PERO     53                0.35 monophasic         +---------+------------------+-----+----------+--------+ DP       62                0.41 monophasic         +---------+------------------+-----+----------+--------+

## 2023-02-12 ENCOUNTER — Telehealth (INDEPENDENT_AMBULATORY_CARE_PROVIDER_SITE_OTHER): Payer: Self-pay

## 2023-02-12 NOTE — Telephone Encounter (Signed)
I spoke with the patient and she is scheduled with Dr. Wyn Quaker on 02/18/23 (right - 11:30 am arrival) and 02/25/23 (left - 6:45 am arrival) to the [facility]. Pre-procedure instructions were discussed and will be mailed.

## 2023-02-14 ENCOUNTER — Ambulatory Visit (INDEPENDENT_AMBULATORY_CARE_PROVIDER_SITE_OTHER): Payer: Medicare HMO | Admitting: Nurse Practitioner

## 2023-02-18 ENCOUNTER — Encounter
Admission: AD | Disposition: A | Discharge status: Home / Self Care | Payer: Self-pay | Source: Home / Self Care | Attending: Vascular Surgery

## 2023-02-18 ENCOUNTER — Other Ambulatory Visit: Payer: Self-pay

## 2023-02-18 ENCOUNTER — Inpatient Hospital Stay
Admission: AD | Admit: 2023-02-18 | Discharge: 2023-02-21 | DRG: 271 | Disposition: A | Discharge status: Home / Self Care | Payer: Medicare HMO | Attending: Vascular Surgery | Admitting: Vascular Surgery

## 2023-02-18 ENCOUNTER — Encounter: Payer: Self-pay | Admitting: Vascular Surgery

## 2023-02-18 DIAGNOSIS — I10 Essential (primary) hypertension: Secondary | ICD-10-CM | POA: Diagnosis not present

## 2023-02-18 DIAGNOSIS — I998 Other disorder of circulatory system: Principal | ICD-10-CM | POA: Diagnosis present

## 2023-02-18 DIAGNOSIS — Z79899 Other long term (current) drug therapy: Secondary | ICD-10-CM | POA: Diagnosis not present

## 2023-02-18 DIAGNOSIS — Z87891 Personal history of nicotine dependence: Secondary | ICD-10-CM

## 2023-02-18 DIAGNOSIS — Y838 Other surgical procedures as the cause of abnormal reaction of the patient, or of later complication, without mention of misadventure at the time of the procedure: Secondary | ICD-10-CM | POA: Diagnosis present

## 2023-02-18 DIAGNOSIS — T82868A Thrombosis of vascular prosthetic devices, implants and grafts, initial encounter: Secondary | ICD-10-CM | POA: Diagnosis not present

## 2023-02-18 DIAGNOSIS — I70221 Atherosclerosis of native arteries of extremities with rest pain, right leg: Secondary | ICD-10-CM | POA: Diagnosis not present

## 2023-02-18 DIAGNOSIS — Z5941 Food insecurity: Secondary | ICD-10-CM | POA: Diagnosis not present

## 2023-02-18 DIAGNOSIS — E782 Mixed hyperlipidemia: Secondary | ICD-10-CM | POA: Diagnosis not present

## 2023-02-18 DIAGNOSIS — I708 Atherosclerosis of other arteries: Secondary | ICD-10-CM | POA: Diagnosis not present

## 2023-02-18 DIAGNOSIS — L97519 Non-pressure chronic ulcer of other part of right foot with unspecified severity: Secondary | ICD-10-CM | POA: Diagnosis not present

## 2023-02-18 DIAGNOSIS — I70222 Atherosclerosis of native arteries of extremities with rest pain, left leg: Secondary | ICD-10-CM | POA: Diagnosis not present

## 2023-02-18 DIAGNOSIS — Z7901 Long term (current) use of anticoagulants: Secondary | ICD-10-CM

## 2023-02-18 DIAGNOSIS — I743 Embolism and thrombosis of arteries of the lower extremities: Secondary | ICD-10-CM

## 2023-02-18 DIAGNOSIS — Z7982 Long term (current) use of aspirin: Secondary | ICD-10-CM

## 2023-02-18 DIAGNOSIS — I70299 Other atherosclerosis of native arteries of extremities, unspecified extremity: Secondary | ICD-10-CM

## 2023-02-18 DIAGNOSIS — Z90711 Acquired absence of uterus with remaining cervical stump: Secondary | ICD-10-CM

## 2023-02-18 DIAGNOSIS — Z9889 Other specified postprocedural states: Secondary | ICD-10-CM

## 2023-02-18 DIAGNOSIS — T82856A Stenosis of peripheral vascular stent, initial encounter: Secondary | ICD-10-CM | POA: Diagnosis not present

## 2023-02-18 DIAGNOSIS — I70235 Atherosclerosis of native arteries of right leg with ulceration of other part of foot: Principal | ICD-10-CM | POA: Diagnosis present

## 2023-02-18 DIAGNOSIS — L97909 Non-pressure chronic ulcer of unspecified part of unspecified lower leg with unspecified severity: Secondary | ICD-10-CM

## 2023-02-18 HISTORY — PX: LOWER EXTREMITY ANGIOGRAPHY: CATH118251

## 2023-02-18 LAB — CBC
HCT: 37.3 % (ref 36.0–46.0)
HCT: 37.6 % (ref 36.0–46.0)
Hemoglobin: 12.2 g/dL (ref 12.0–15.0)
Hemoglobin: 12.2 g/dL (ref 12.0–15.0)
MCH: 31.9 pg (ref 26.0–34.0)
MCH: 31.9 pg (ref 26.0–34.0)
MCHC: 32.7 g/dL (ref 30.0–36.0)
MCHC: 32.4 g/dL (ref 30.0–36.0)
MCV: 97.4 fL (ref 80.0–100.0)
MCV: 98.2 fL (ref 80.0–100.0)
Platelets: 265 10*3/uL (ref 150–400)
Platelets: 220 10*3/uL (ref 150–400)
RBC: 3.83 MIL/uL — ABNORMAL LOW (ref 3.87–5.11)
RBC: 3.83 MIL/uL — ABNORMAL LOW (ref 3.87–5.11)
RDW: 13.2 % (ref 11.5–15.5)
RDW: 13.1 % (ref 11.5–15.5)
WBC: 7.2 10*3/uL (ref 4.0–10.5)
WBC: 9.5 10*3/uL (ref 4.0–10.5)
nRBC: 0 % (ref 0.0–0.2)
nRBC: 0 % (ref 0.0–0.2)

## 2023-02-18 LAB — FIBRINOGEN: Fibrinogen: 305 mg/dL (ref 210–475)

## 2023-02-18 LAB — BUN: BUN: 11 mg/dL (ref 8–23)

## 2023-02-18 LAB — HEPARIN LEVEL (UNFRACTIONATED): Heparin Unfractionated: 0.23 IU/mL — ABNORMAL LOW (ref 0.30–0.70)

## 2023-02-18 LAB — CREATININE, SERUM
Creatinine, Ser: 0.54 mg/dL (ref 0.44–1.00)
GFR, Estimated: >60 mL/min (ref >60)

## 2023-02-18 LAB — GLUCOSE, CAPILLARY: Glucose-Capillary: 106 mg/dL — ABNORMAL HIGH (ref 70–99)

## 2023-02-18 SURGERY — LOWER EXTREMITY ANGIOGRAPHY
Anesthesia: Moderate Sedation | Site: Leg Lower | Laterality: Right

## 2023-02-18 MED ORDER — LIDOCAINE-EPINEPHRINE (PF) 1 %-1:200000 IJ SOLN
INTRAMUSCULAR | Status: DC | PRN
  Administered 2023-02-18: 10 mL

## 2023-02-18 MED ORDER — MIDAZOLAM HCL 2 MG/2ML IJ SOLN
INTRAMUSCULAR | Status: DC | PRN
  Administered 2023-02-18 (×4): 1 mg via INTRAVENOUS

## 2023-02-18 MED ORDER — SODIUM CHLORIDE 0.9 % IV SOLN
0.5000 mg/h | INTRAVENOUS | Status: DC
  Filled 2023-02-18: qty 10

## 2023-02-18 MED ORDER — MIDAZOLAM HCL 2 MG/ML PO SYRP: 8.0000 mg | ORAL_SOLUTION | Freq: Once | ORAL | Status: DC | PRN

## 2023-02-18 MED ORDER — SODIUM CHLORIDE 0.9 % IV SOLN: INTRAVENOUS | Status: DC

## 2023-02-18 MED ORDER — ASPIRIN 81 MG PO TBEC
81.0000 mg | DELAYED_RELEASE_TABLET | Freq: Every day | ORAL | Status: DC
  Administered 2023-02-20 – 2023-02-21 (×2): 81 mg via ORAL
  Filled 2023-02-18 (×2): qty 1

## 2023-02-18 MED ORDER — CEFAZOLIN SODIUM-DEXTROSE 2-4 GM/100ML-% IV SOLN
INTRAVENOUS | Status: AC
  Filled 2023-02-18: qty 100

## 2023-02-18 MED ORDER — AMLODIPINE BESYLATE 5 MG PO TABS
ORAL_TABLET | ORAL | Status: AC
  Filled 2023-02-18: qty 2

## 2023-02-18 MED ORDER — ASPIRIN 81 MG PO CHEW
CHEWABLE_TABLET | ORAL | Status: AC
  Filled 2023-02-18: qty 1

## 2023-02-18 MED ORDER — ALTEPLASE 2 MG IJ SOLR
INTRAMUSCULAR | Status: AC
  Filled 2023-02-18: qty 8

## 2023-02-18 MED ORDER — FENTANYL CITRATE PF 50 MCG/ML IJ SOSY
PREFILLED_SYRINGE | INTRAMUSCULAR | Status: AC
  Filled 2023-02-18: qty 1

## 2023-02-18 MED ORDER — HEPARIN (PORCINE) 25000 UT/250ML-% IV SOLN
INTRAVENOUS | Status: AC
  Filled 2023-02-18: qty 250

## 2023-02-18 MED ORDER — FENTANYL CITRATE (PF) 100 MCG/2ML IJ SOLN
INTRAMUSCULAR | Status: AC
  Filled 2023-02-18: qty 2

## 2023-02-18 MED ORDER — HEPARIN (PORCINE) IN NACL 2000-0.9 UNIT/L-% IV SOLN
INTRAVENOUS | Status: DC | PRN
  Administered 2023-02-18: 1000 mL

## 2023-02-18 MED ORDER — ATORVASTATIN CALCIUM 10 MG PO TABS
10.0000 mg | ORAL_TABLET | Freq: Every day | ORAL | Status: DC
  Administered 2023-02-18 – 2023-02-20 (×2): 10 mg via ORAL
  Filled 2023-02-18 (×2): qty 1

## 2023-02-18 MED ORDER — AMLODIPINE BESYLATE 10 MG PO TABS
10.0000 mg | ORAL_TABLET | Freq: Every day | ORAL | Status: DC
  Administered 2023-02-18 – 2023-02-21 (×3): 10 mg via ORAL
  Filled 2023-02-18 (×2): qty 1

## 2023-02-18 MED ORDER — SODIUM CHLORIDE 0.9% FLUSH
3.0000 mL | Freq: Two times a day (BID) | INTRAVENOUS | Status: DC
  Administered 2023-02-18 – 2023-02-21 (×6): 3 mL via INTRAVENOUS

## 2023-02-18 MED ORDER — LABETALOL HCL 5 MG/ML IV SOLN
INTRAVENOUS | Status: AC
  Administered 2023-02-18: 10 mg via INTRAVENOUS
  Filled 2023-02-18: qty 4

## 2023-02-18 MED ORDER — HYDROMORPHONE HCL 1 MG/ML IJ SOLN
INTRAMUSCULAR | Status: AC
  Administered 2023-02-18: 1 mg
  Filled 2023-02-18: qty 1

## 2023-02-18 MED ORDER — ONDANSETRON HCL 4 MG/2ML IJ SOLN: 4.0000 mg | Freq: Four times a day (QID) | INTRAMUSCULAR | Status: DC | PRN

## 2023-02-18 MED ORDER — HYDROCODONE-ACETAMINOPHEN 5-325 MG PO TABS
1.0000 | ORAL_TABLET | Freq: Four times a day (QID) | ORAL | Status: DC | PRN
  Administered 2023-02-18 – 2023-02-21 (×6): 1 via ORAL
  Filled 2023-02-18 (×8): qty 1

## 2023-02-18 MED ORDER — FENTANYL CITRATE (PF) 100 MCG/2ML IJ SOLN
INTRAMUSCULAR | Status: DC | PRN
  Administered 2023-02-18 (×3): 50 ug via INTRAVENOUS

## 2023-02-18 MED ORDER — HEPARIN SODIUM (PORCINE) 1000 UNIT/ML IJ SOLN
INTRAMUSCULAR | Status: AC
  Filled 2023-02-18: qty 10

## 2023-02-18 MED ORDER — METOPROLOL TARTRATE 25 MG PO TABS
25.0000 mg | ORAL_TABLET | Freq: Two times a day (BID) | ORAL | Status: DC
  Administered 2023-02-18 – 2023-02-21 (×5): 25 mg via ORAL
  Filled 2023-02-18 (×5): qty 1

## 2023-02-18 MED ORDER — SODIUM CHLORIDE 0.9 % IV SOLN: 250.0000 mL | INTRAVENOUS | Status: DC | PRN

## 2023-02-18 MED ORDER — HYDROMORPHONE HCL 1 MG/ML IJ SOLN
1.0000 mg | Freq: Once | INTRAMUSCULAR | Status: AC | PRN
  Administered 2023-02-19: 1 mg via INTRAVENOUS
  Filled 2023-02-18: qty 1

## 2023-02-18 MED ORDER — LABETALOL HCL 5 MG/ML IV SOLN: 10.0000 mg | Freq: Once | INTRAVENOUS | Status: AC

## 2023-02-18 MED ORDER — METHYLPREDNISOLONE SODIUM SUCC 125 MG IJ SOLR: 125.0000 mg | Freq: Once | INTRAMUSCULAR | Status: DC | PRN

## 2023-02-18 MED ORDER — CEFAZOLIN SODIUM-DEXTROSE 2-4 GM/100ML-% IV SOLN
2.0000 g | INTRAVENOUS | Status: AC
  Administered 2023-02-18: 2 g via INTRAVENOUS

## 2023-02-18 MED ORDER — HEPARIN (PORCINE) 25000 UT/250ML-% IV SOLN
600.0000 [IU]/h | INTRAVENOUS | Status: DC
  Administered 2023-02-18: 600 [IU]/h via INTRA_ARTERIAL

## 2023-02-18 MED ORDER — ALTEPLASE 2 MG IJ SOLR
INTRAMUSCULAR | Status: DC | PRN
  Administered 2023-02-18: 8 mg

## 2023-02-18 MED ORDER — HEPARIN SODIUM (PORCINE) 1000 UNIT/ML IJ SOLN
INTRAMUSCULAR | Status: DC | PRN
  Administered 2023-02-18: 4000 [IU] via INTRAVENOUS

## 2023-02-18 MED ORDER — SODIUM CHLORIDE 0.9% FLUSH: 3.0000 mL | INTRAVENOUS | Status: DC | PRN

## 2023-02-18 MED ORDER — MIDAZOLAM HCL 2 MG/2ML IJ SOLN
INTRAMUSCULAR | Status: AC
  Filled 2023-02-18: qty 2

## 2023-02-18 MED ORDER — DIPHENHYDRAMINE HCL 50 MG/ML IJ SOLN: 50.0000 mg | Freq: Once | INTRAMUSCULAR | Status: DC | PRN

## 2023-02-18 MED ORDER — FAMOTIDINE 20 MG PO TABS: 40.0000 mg | ORAL_TABLET | Freq: Once | ORAL | Status: DC | PRN

## 2023-02-18 MED ORDER — SODIUM CHLORIDE 0.9 % IV SOLN
1.0000 mg/h | INTRAVENOUS | Status: AC
  Administered 2023-02-18: 1 mg/h
  Filled 2023-02-18: qty 10

## 2023-02-18 MED ORDER — IODIXANOL 320 MG/ML IV SOLN
INTRAVENOUS | Status: DC | PRN
  Administered 2023-02-18: 55 mL via INTRA_ARTERIAL

## 2023-02-18 MED ORDER — VITAMIN D 25 MCG (1000 UNIT) PO TABS
1000.0000 [IU] | ORAL_TABLET | Freq: Every day | ORAL | Status: DC
  Administered 2023-02-20 – 2023-02-21 (×2): 1000 [IU] via ORAL
  Filled 2023-02-18 (×2): qty 1

## 2023-02-18 SURGICAL SUPPLY — 21 items
BALLN ULTRVRSE 2.5X300X150 (BALLOONS) ×1
BALLOON ULTRVRSE 2.5X300X150 (BALLOONS) IMPLANT
CANISTER PENUMBRA ENGINE (MISCELLANEOUS) IMPLANT
CATH 0.018 NAVICROSS ANG 135 (CATHETERS) IMPLANT
CATH ANGIO 5F PIGTAIL 65CM (CATHETERS) IMPLANT
CATH BEACON 5 .035 65 KMP TIP (CATHETERS) IMPLANT
CATH BEACON 5 .038 100 VERT TP (CATHETERS) IMPLANT
CATH INDIGO CAT6 KIT (CATHETERS) IMPLANT
CATH INFUS 135CMX50CM (CATHETERS) IMPLANT
CATH TEMPO 5F RIM 65CM (CATHETERS) IMPLANT
COVER PROBE ULTRASOUND 5X96 (MISCELLANEOUS) IMPLANT
GLIDEWIRE ADV .035X260CM (WIRE) IMPLANT
KIT ENCORE 26 ADVANTAGE (KITS) IMPLANT
PACK ANGIOGRAPHY (CUSTOM PROCEDURE TRAY) ×1 IMPLANT
SHEATH BRITE TIP 5FRX11 (SHEATH) IMPLANT
SHEATH PINNACLE MP 6F 45CM (SHEATH) IMPLANT
SUT PROLENE 0 CT 1 30 (SUTURE) IMPLANT
SYR MEDRAD MARK 7 150ML (SYRINGE) IMPLANT
TUBING CONTRAST HIGH PRESS 72 (TUBING) IMPLANT
WIRE G V18X300CM (WIRE) IMPLANT
WIRE GUIDERIGHT .035X150 (WIRE) IMPLANT

## 2023-02-18 NOTE — Interval H&P Note (Signed)
History and Physical Interval Note:  02/18/2023 12:00 PM  Yvonne [name]  has presented today for surgery, with the diagnosis of RLE Angio   ASO w ulceration.  The various methods of treatment have been discussed with the patient and family. After consideration of risks, benefits and other options for treatment, the patient has consented to  Procedure(s): Lower Extremity Angiography (Right) as a surgical intervention.  The patient's history has been reviewed, patient examined, no change in status, stable for surgery.  I have reviewed the patient's chart and labs.  Questions were answered to the patient's satisfaction.     [name]

## 2023-02-18 NOTE — Progress Notes (Addendum)
Patient admitted to unit from [facility]. Oriented to room, call bell, and staff. Bed in lowest position. Fall safety plan reviewed. Full assessment to Epic. Skin assessment verified with Melina Copa.  Upon assessment, unable to get pulse with doppler on R foot, and L tibial pulse. Dr. Wyn Quaker notified.MD also notified of elevated BP 172/72, per MD okay to give dose of metoprolol early.  Will continue to monitor.

## 2023-02-18 NOTE — Op Note (Signed)
[facility]  Percutaneous Study/Intervention Procedural Note   Date of Surgery: 02/18/2023  Surgeon(s):Dianne Bady    Assistants:none  Pre-operative Diagnosis: PAD with rest pain right lower extremity, acute on chronic ischemia  Post-operative diagnosis:  Same  Procedure(s) Performed:             1.  Ultrasound guidance for vascular access left femoral artery             2.  Catheter placement into right common femoral artery from left femoral approach             3.  Aortogram and selective right lower extremity angiogram             4.  Mechanical thrombectomy of the right SFA and popliteal arteries as well as the TP trunk and proximal peroneal artery with the penumbra CAT 6 device             5.  Angioplasty of the right peroneal artery and tibioperoneal trunk with a 2.5 mm diameter by 30 cm length angioplasty balloon  6.  Catheter directed thrombolytic therapy with 8 mg of tPA instilled in the right SFA, popliteal artery, tibioperoneal trunk, and proximal peroneal artery with placement of a 135 cm total length 50 cm working length lytic catheter for continuous thrombolytic therapy             7.  Ultrasound for vascular access left femoral vein  8.   Placement of a left femoral venous triple-lumen catheter  EBL: 100 cc  Contrast: 55 cc  Fluoro Time: 10.9 minutes  Moderate Conscious Sedation Time: approximately 45 minutes using 4 mg of Versed and 150 mcg of Fentanyl              Indications:  Patient is a 67 y.o.female with an ischemic right lower extremity. The patient has noninvasive study showing thrombosis of her previous right SFA and popliteal stents with markedly reduced flow distally. The patient is brought in for angiography for further evaluation and potential treatment.  Due to the limb threatening nature of the situation, angiogram was performed for attempted limb salvage. The patient is aware that if the procedure fails, amputation would be  expected.  The patient also understands that even with successful revascularization, amputation may still be required due to the severity of the situation.  Risks and benefits are discussed and informed consent is obtained.   Procedure:  The patient was identified and appropriate procedural time out was performed.  The patient was then placed supine on the table and prepped and draped in the usual sterile fashion. Moderate conscious sedation was administered during a face to face encounter with the patient throughout the procedure with my supervision of the RN administering medicines and monitoring the patient's vital signs, pulse oximetry, telemetry and mental status throughout from the start of the procedure until the patient was taken to the recovery room. Ultrasound was used to evaluate the left common femoral artery.  It was patent .  A digital ultrasound image was acquired.  A Seldinger needle was used to access the left common femoral artery under direct ultrasound guidance and a permanent image was performed.  A 0.035 J wire was advanced without resistance and a 5Fr sheath was placed.  Pigtail catheter was placed into the aorta and an AP aortogram was performed. This demonstrated normal renal arteries.  The aorta was fairly normal.  Both iliac arteries were fairly heavily calcified but only mild stenosis appears  to be present. I then crossed the aortic bifurcation and advanced to the right femoral head. Selective right lower extremity angiogram was then performed. This demonstrated recurrent flush occlusion of the right SFA and popliteal stents with extensive thrombosis.  Distal imaging was very poor due to the limited flow, but there was reconstitution of the anterior tibial artery in the proximal segment but the then occluded again in the mid to distal segment was not continuous to the foot.  The best runoff distally was the peroneal artery which reconstituted in the midsegment.  The posterior tibial  artery was chronically occluded without obvious distal reconstitution. It was felt that it was in the patient's best interest to proceed with intervention after these images to avoid a second procedure and a larger amount of contrast and fluoroscopy based off of the findings from the initial angiogram. The patient was systemically heparinized and a 6 [place] destination sheath was then placed over the Terumo Advantage wire. I then used a Kumpe catheter and the advantage wire to get into the SFA occlusion and crossed this without difficulty first getting an advantage wire and Kumpe catheter down into the anterior tibial artery but again this was demonstrated to be occluded in the mid to distal segment without filling the foot.  I then redirected the Kumpe catheter with a V18 wire and then a Nava cross catheter to get down into the peroneal artery and confirm intraluminal flow in the mid to distal peroneal artery.  The V18 wire was replaced and the diagnostic catheter was removed.  I then made 4 passes with the penumbra CAT 6 device to treat the extensive thrombosis within the right SFA, popliteal artery, tibioperoneal trunk, and even the proximal peroneal artery.  Imaging following this showed still limited flow distally and there remained occlusion in the tibioperoneal trunk and proximal peroneal artery even after thrombectomy.  There is still also a large amount of thrombus residual within the stents in the SFA and popliteal arteries.  I then elected to perform angioplasty on the tibioperoneal trunk and peroneal artery to provide runoff and place a thrombolytic catheter for continuous thrombolytic therapy as I felt this would be the best chance of success.  A 2.5 mm diameter by 30 cm length angioplasty balloon was used to treat the peroneal artery from the mid to distal segment up through the tibioperoneal trunk.  This was inflated to 10 atm for 1 minute.  This did seem to result in some return of patency of the  peroneal artery but there remained occlusion and thrombosis above and so I placed a 135 cm length 50 cm working length thrombolysis catheter.  The proximal extent was parked at the origin of the SFA and the distal tip was placed in the proximal peroneal artery.  8 mg of tPA were then instilled through the catheter and the catheter and the sheath were secured in place with Prolene suture.  To provide durable venous access for blood draws and avoidance of having to stick her while on tPA, a central line was placed.  The left femoral vein was visualized with ultrasound and found to be widely patent.  It was then accessed under direct ultrasound guidance without difficulty with a Seldinger needle and a permanent image was recorded.  A J-wire was placed.  After skin nick and dilatation a triple-lumen catheter was placed over the wire and the wire was removed.  All 3 lm withdrew dark red nonpulsatile blood and flushed easily with sterile saline.  It was then secured into place with 2 Prolene sutures. I elected to terminate the procedure.  The patient was taken to the recovery room in stable condition having tolerated the procedure well.  Findings:               Aortogram:  This demonstrated normal renal arteries.  The aorta was fairly normal.  Both iliac arteries were fairly heavily calcified but only mild stenosis appears to be present.             Right lower Extremity:  This demonstrated recurrent flush occlusion of the right SFA and popliteal stents with extensive thrombosis.  Distal imaging was very poor due to the limited flow, but there was reconstitution of the anterior tibial artery in the proximal segment but the then occluded again in the mid to distal segment was not continuous to the foot.  The best runoff distally was the peroneal artery which reconstituted in the midsegment.  The posterior tibial artery was chronically occluded without obvious distal reconstitution.   Disposition: Patient was taken  to the recovery room in stable condition having tolerated the procedure well.  Complications: None  [name] 02/18/2023 2:35 PM   This note was created with Dragon Medical transcription system. Any errors in dictation are purely unintentional.

## 2023-02-19 ENCOUNTER — Encounter: Payer: Self-pay | Admitting: Vascular Surgery

## 2023-02-19 ENCOUNTER — Encounter
Admission: AD | Disposition: A | Discharge status: Home / Self Care | Payer: Self-pay | Source: Home / Self Care | Attending: Vascular Surgery

## 2023-02-19 DIAGNOSIS — T82868A Thrombosis of vascular prosthetic devices, implants and grafts, initial encounter: Secondary | ICD-10-CM | POA: Diagnosis not present

## 2023-02-19 DIAGNOSIS — T82856A Stenosis of peripheral vascular stent, initial encounter: Secondary | ICD-10-CM

## 2023-02-19 DIAGNOSIS — Z9889 Other specified postprocedural states: Secondary | ICD-10-CM

## 2023-02-19 DIAGNOSIS — I743 Embolism and thrombosis of arteries of the lower extremities: Secondary | ICD-10-CM | POA: Diagnosis not present

## 2023-02-19 DIAGNOSIS — I70221 Atherosclerosis of native arteries of extremities with rest pain, right leg: Secondary | ICD-10-CM | POA: Diagnosis not present

## 2023-02-19 HISTORY — PX: LOWER EXTREMITY ANGIOGRAPHY: CATH118251

## 2023-02-19 LAB — LIPID PANEL
Cholesterol: 144 mg/dL (ref 0–200)
HDL: 65 mg/dL (ref >40)
LDL Cholesterol: 63 mg/dL (ref 0–99)
Total CHOL/HDL Ratio: 2.2 RATIO
Triglycerides: 80 mg/dL (ref <150)
VLDL: 16 mg/dL (ref 0–40)

## 2023-02-19 LAB — FIBRINOGEN
Fibrinogen: 124 mg/dL — ABNORMAL LOW (ref 210–475)
Fibrinogen: 119 mg/dL — ABNORMAL LOW (ref 210–475)

## 2023-02-19 LAB — CBC
HCT: 35.5 % — ABNORMAL LOW (ref 36.0–46.0)
Hemoglobin: 12.1 g/dL (ref 12.0–15.0)
MCH: 33 pg (ref 26.0–34.0)
MCHC: 34.1 g/dL (ref 30.0–36.0)
MCV: 96.7 fL (ref 80.0–100.0)
Platelets: 207 10*3/uL (ref 150–400)
RBC: 3.67 MIL/uL — ABNORMAL LOW (ref 3.87–5.11)
RDW: 13 % (ref 11.5–15.5)
WBC: 8.5 10*3/uL (ref 4.0–10.5)
nRBC: 0 % (ref 0.0–0.2)

## 2023-02-19 LAB — HEPARIN LEVEL (UNFRACTIONATED)
Heparin Unfractionated: <0.1 IU/mL — ABNORMAL LOW (ref 0.30–0.70)
Heparin Unfractionated: <0.1 IU/mL — ABNORMAL LOW (ref 0.30–0.70)

## 2023-02-19 SURGERY — LOWER EXTREMITY ANGIOGRAPHY
Anesthesia: Moderate Sedation | Laterality: Right

## 2023-02-19 MED ORDER — LIDOCAINE-EPINEPHRINE (PF) 1 %-1:200000 IJ SOLN
INTRAMUSCULAR | Status: DC | PRN
  Administered 2023-02-19: 10 mL via INTRADERMAL

## 2023-02-19 MED ORDER — LABETALOL HCL 5 MG/ML IV SOLN
10.0000 mg | INTRAVENOUS | Status: DC | PRN
  Administered 2023-02-19: 10 mg via INTRAVENOUS
  Filled 2023-02-19: qty 4

## 2023-02-19 MED ORDER — HEPARIN SODIUM (PORCINE) 1000 UNIT/ML IJ SOLN
INTRAMUSCULAR | Status: AC
  Filled 2023-02-19: qty 10

## 2023-02-19 MED ORDER — TIROFIBAN HCL IN NACL 5-0.9 MG/100ML-% IV SOLN
0.1500 ug/kg/min | INTRAVENOUS | Status: DC
Start: 2023-02-19 — End: 2023-02-19

## 2023-02-19 MED ORDER — HEPARIN (PORCINE) IN NACL 1000-0.9 UT/500ML-% IV SOLN
INTRAVENOUS | Status: DC | PRN
  Administered 2023-02-19: 1000 mL

## 2023-02-19 MED ORDER — IODIXANOL 320 MG/ML IV SOLN
INTRAVENOUS | Status: DC | PRN
  Administered 2023-02-19: 35 mL via INTRA_ARTERIAL

## 2023-02-19 MED ORDER — NITROGLYCERIN 1 MG/10 ML FOR IR/CATH LAB
INTRA_ARTERIAL | Status: DC | PRN
  Administered 2023-02-19: 300 ug

## 2023-02-19 MED ORDER — FENTANYL CITRATE (PF) 100 MCG/2ML IJ SOLN
INTRAMUSCULAR | Status: AC
  Filled 2023-02-19: qty 2

## 2023-02-19 MED ORDER — MIDAZOLAM HCL 2 MG/2ML IJ SOLN
INTRAMUSCULAR | Status: DC | PRN
  Administered 2023-02-19: .5 mg via INTRAVENOUS
  Administered 2023-02-19: 1 mg via INTRAVENOUS

## 2023-02-19 MED ORDER — HYDROMORPHONE HCL 1 MG/ML IJ SOLN
1.0000 mg | INTRAMUSCULAR | Status: DC | PRN
  Administered 2023-02-19 – 2023-02-20 (×5): 1 mg via INTRAVENOUS
  Filled 2023-02-19 (×5): qty 1

## 2023-02-19 MED ORDER — FENTANYL CITRATE (PF) 100 MCG/2ML IJ SOLN
INTRAMUSCULAR | Status: DC | PRN
  Administered 2023-02-19: 25 ug via INTRAVENOUS
  Administered 2023-02-19: 50 ug via INTRAVENOUS

## 2023-02-19 MED ORDER — TIROFIBAN (AGGRASTAT) BOLUS VIA INFUSION
25.0000 ug/kg | Freq: Once | INTRAVENOUS | Status: AC
  Administered 2023-02-19: 1465 ug via INTRAVENOUS
  Filled 2023-02-19: qty 30

## 2023-02-19 MED ORDER — MIDAZOLAM HCL 2 MG/2ML IJ SOLN
INTRAMUSCULAR | Status: AC
  Filled 2023-02-19: qty 2

## 2023-02-19 MED ORDER — CHLORHEXIDINE GLUCONATE CLOTH 2 % EX PADS
6.0000 | MEDICATED_PAD | Freq: Every day | CUTANEOUS | Status: DC
  Administered 2023-02-20 – 2023-02-21 (×2): 6 via TOPICAL

## 2023-02-19 MED ORDER — TIROFIBAN HCL IV 12.5 MG/250 ML
0.0750 ug/kg/min | INTRAVENOUS | Status: AC
  Administered 2023-02-19 (×2): 0.075 ug/kg/min via INTRAVENOUS
  Filled 2023-02-19: qty 250

## 2023-02-19 MED ORDER — HEPARIN SODIUM (PORCINE) 1000 UNIT/ML IJ SOLN
INTRAMUSCULAR | Status: DC | PRN
  Administered 2023-02-19: 3000 [IU] via INTRAVENOUS

## 2023-02-19 MED ORDER — NITROGLYCERIN 1 MG/10 ML FOR IR/CATH LAB
INTRA_ARTERIAL | Status: AC
  Filled 2023-02-19: qty 10

## 2023-02-19 SURGICAL SUPPLY — 10 items
BALLN ULTRVRSE 3X150X150 (BALLOONS) ×1
BALLOON ULTRVRSE 3X150X150 (BALLOONS) IMPLANT
CANISTER PENUMBRA ENGINE (MISCELLANEOUS) IMPLANT
CATH INDIGO CAT6 KIT (CATHETERS) IMPLANT
DEVICE STARCLOSE SE CLOSURE (Vascular Products) IMPLANT
KIT ENCORE 26 ADVANTAGE (KITS) IMPLANT
PACK ANGIOGRAPHY (CUSTOM PROCEDURE TRAY) ×1 IMPLANT
STENT VIABAHN 6X7.5X120 (Permanent Stent) IMPLANT
WIRE G V18X300CM (WIRE) IMPLANT
WIRE GUIDERIGHT .035X150 (WIRE) IMPLANT

## 2023-02-19 NOTE — TOC CM/SW Note (Signed)
Transition of Care [facility]) - Inpatient Brief Assessment   Patient Details  Name: Yvonne [name] MRN: [id number] Date of Birth: 1956/01/24  [facility] CM/SW Contact:    Chapman Fitch, RN Phone Number: 02/19/2023, 1:53 PM   Clinical Narrative:   POD 0 angioplasty.  Per vascular amputation may still required.  Patient would benefit from PT OT eval when medically appropriate to determine appropriate disposition    Transition of Care Asessment: Insurance and Status: Insurance coverage has been reviewed Patient has primary care physician: Yes     Prior/Current Home Services: No current home services Social Determinants of Health Reivew: SDOH reviewed no interventions necessary Readmission risk has been reviewed: Yes Transition of care needs: transition of care needs identified, TOC will continue to follow

## 2023-02-19 NOTE — Op Note (Signed)
[facility]  Percutaneous Study/Intervention Procedural Note   Date of Surgery: 02/19/2023  Surgeon(s):Eymi Lipuma    Assistants:none  Pre-operative Diagnosis: PAD with rest pain RLE, s/p overnight thrombolytic therapy  Post-operative diagnosis:  Same  Procedure(s) Performed:             1.  Right lower extremity angiogram through existing catheter             2.  Mechanical thrombectomy using the penumbra CAT 6 device to the right popliteal artery, tibioperoneal trunk, and proximal peroneal artery             3.  Covered stent placement for residual thrombus creating stenosis in the above-knee popliteal artery with 6 mm diameter by 7.5 cm length Viabahn stent             4.  Angioplasty of the tibioperoneal trunk and proximal peroneal artery with 3 mm diameter by 15 cm length angioplasty balloon for residual thrombus and stenosis             5.  StarClose closure device left femoral artery  EBL: 100 cc  Contrast: 35 cc  Fluoro Time: 3.3 minutes  Moderate Conscious Sedation Time: approximately 34 minutes using 1.5 mg of Versed and 75 mcg of Fentanyl              Indications:  Patient is a 67 y.o.female with ischemic rest pain of the right lower extremity status post overnight thrombolytic therapy. The patient is brought in for angiography for further evaluation and potential treatment.  Due to the limb threatening nature of the situation, angiogram was performed for attempted limb salvage. The patient is aware that if the procedure fails, amputation would be expected.  The patient also understands that even with successful revascularization, amputation may still be required due to the severity of the situation.  Risks and benefits are discussed and informed consent is obtained.   Procedure:  The patient was identified and appropriate procedural time out was performed.  The patient was then placed supine on the table and prepped and draped in the usual sterile  fashion. Moderate conscious sedation was administered during a face to face encounter with the patient throughout the procedure with my supervision of the RN administering medicines and monitoring the patient's vital signs, pulse oximetry, telemetry and mental status throughout from the start of the procedure until the patient was taken to the recovery room.  The existing thrombolytic catheter was removed after a V18 wire was placed and parked in the distal peroneal artery.  Imaging was performed through the sheath.  Selective right lower extremity angiogram was then performed. This demonstrated clearance of the majority of the thrombus.  The proximal, mid, and distal SFA were patent.  There was thrombus creating stenosis within the previously placed stents in the above-knee popliteal artery causing over 60% narrowing.  This was relatively short segment.  The remainder of the stents were normal down to the distal extent but in the distal popliteal artery, tibioperoneal trunk, and proximal portion of the peroneal artery there was thrombus and stenosis.  The anterior tibial artery was now in line, but it did not fill the foot and terminated in the midsegment. It was felt that it was in the patient's best interest to proceed with intervention after these images to avoid a second procedure and a larger amount of contrast.  I started with mechanical thrombectomy to try to address the thrombus in multiple locations.  Several  passes were made within the popliteal artery and the area of thrombus within the previously placed stents with minimal improvement.  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  I elected to place a covered stent to cover the thrombus and stenosis within the  previously placed stents in the above-knee popliteal artery.  A 6 mm diameter by 7.5 cm length Viabahn stent was deployed without significant residual stenosis and exclusion of the thrombus.  I used a 3 mm diameter by 15 cm length angioplasty balloon inflated to 8 atm for 1 minute in the right tibioperoneal trunk and proximal peroneal artery with completion imaging showing only about a 30% residual stenosis with good flow distally.  There was still distal flow limitation likely from a combination of spasm and distal thrombosis and the patient was given intra-arterial nitroglycerin and will be treated with Aggrastat following the procedure.  I elected to terminate the procedure. The sheath was removed and StarClose closure device was deployed in the left femoral artery with excellent hemostatic result. The patient was taken to the recovery room in stable condition having tolerated the procedure well.  Findings:                            Right Lower Extremity:  This demonstrated clearance of the majority of the thrombus.  The proximal, mid, and distal SFA were patent.  There was thrombus creating stenosis within the previously placed stents in the above-knee popliteal artery causing over 60% narrowing.  This was relatively short segment.  The remainder of the stents were normal down to the distal extent but in the distal popliteal artery, tibioperoneal trunk, and proximal portion of the peroneal artery there was thrombus and stenosis.  The anterior tibial artery was now in line, but it did not fill the foot and terminated in the midsegment.   Disposition: Patient was taken to the recovery room in stable condition having tolerated the procedure well.  Complications: None  [name] 02/19/2023 8:58 AM   This note was created with Dragon Medical transcription system. Any errors in dictation are purely unintentional.

## 2023-02-19 NOTE — Progress Notes (Addendum)
Dr. Wyn Quaker notified about oozing around patient Left Groin site and also about patient elevated BP 166/98.Orders for Labetalol 10mg ; and if any excessive bleeding hold TPA for a hour. Albertine Patricia, RN 3:48 AM 02/19/2023

## 2023-02-20 ENCOUNTER — Encounter: Payer: Self-pay | Admitting: Vascular Surgery

## 2023-02-20 LAB — CBC
HCT: 31.9 % — ABNORMAL LOW (ref 36.0–46.0)
Hemoglobin: 10.9 g/dL — ABNORMAL LOW (ref 12.0–15.0)
MCH: 32.4 pg (ref 26.0–34.0)
MCHC: 34.2 g/dL (ref 30.0–36.0)
MCV: 94.9 fL (ref 80.0–100.0)
Platelets: 163 10*3/uL (ref 150–400)
RBC: 3.36 MIL/uL — ABNORMAL LOW (ref 3.87–5.11)
RDW: 13.2 % (ref 11.5–15.5)
WBC: 10.8 10*3/uL — ABNORMAL HIGH (ref 4.0–10.5)
nRBC: 0 % (ref 0.0–0.2)

## 2023-02-20 LAB — APTT: aPTT: 26 seconds (ref 24–36)

## 2023-02-20 LAB — HEPARIN LEVEL (UNFRACTIONATED)
Heparin Unfractionated: <0.1 IU/mL — ABNORMAL LOW (ref 0.30–0.70)
Heparin Unfractionated: 0.27 IU/mL — ABNORMAL LOW (ref 0.30–0.70)

## 2023-02-20 MED ORDER — HEPARIN (PORCINE) 25000 UT/250ML-% IV SOLN
1400.0000 [IU]/h | INTRAVENOUS | Status: DC
  Administered 2023-02-20: 950 [IU]/h via INTRAVENOUS
  Administered 2023-02-21: 1400 [IU]/h via INTRAVENOUS
  Filled 2023-02-20 (×2): qty 250

## 2023-02-20 MED ORDER — ATORVASTATIN CALCIUM 20 MG PO TABS
20.0000 mg | ORAL_TABLET | Freq: Every day | ORAL | Status: DC
  Administered 2023-02-21: 20 mg via ORAL
  Filled 2023-02-20: qty 1

## 2023-02-20 MED ORDER — HEPARIN BOLUS VIA INFUSION
3500.0000 [IU] | Freq: Once | INTRAVENOUS | Status: AC
  Administered 2023-02-20: 3500 [IU] via INTRAVENOUS
  Filled 2023-02-20: qty 3500

## 2023-02-20 MED ORDER — HEPARIN BOLUS VIA INFUSION
900.0000 [IU] | Freq: Once | INTRAVENOUS | Status: AC
  Administered 2023-02-20: 900 [IU] via INTRAVENOUS
  Filled 2023-02-20: qty 900

## 2023-02-20 NOTE — Progress Notes (Signed)
Progress Note    02/20/2023 11:01 AM 1 Day Post-Op  Subjective:  Yvonne [name] is a 67 yo female who is now POD #1 from right lower extremity angiogram with lysis catheter.  She also had a mechanical thrombectomy of the right popliteal artery, tibioperoneal trunk, and proximal peroneal artery.  She also had stent placement for residual thrombus.  Patient is resting comfortably in bed in ICU this morning.  She denies any pain to her right lower extremity.  She endorses having a conversation with Dr. [name] MD about possible amputation this Friday, 02/22/2023.  She has not decided to move forward at this time.  No complications overnight and vitals all remained stable.   Vitals:   02/20/23 1000 02/20/23 1006  BP: 102/62   Pulse:    Resp:  16  Temp:    SpO2:     Physical Exam: Cardiac:  RRR, normal S1, S2. Lungs: Clear throughout on auscultation.  No rales, rhonchi or wheezing noted. Incisions: Left groin incision with dressing clean dry and intact.  No hematoma or seroma to note. Extremities: Unable to palpate or Doppler pulses in patient's right lower extremity.  No DP or PT pulses found.  Right lower extremity is cool to touch. Abdomen: Positive bowel sounds throughout, soft, nontender and nondistended. Neurologic: Alert and oriented x 3, answers all questions and follows commands appropriately.  CBC    Component Value Date/Time   WBC 10.8 (H) 02/20/2023 0904   RBC 3.36 (L) 02/20/2023 0904   HGB 10.9 (L) 02/20/2023 0904   HCT 31.9 (L) 02/20/2023 0904   PLT 163 02/20/2023 0904   MCV 94.9 02/20/2023 0904   MCH 32.4 02/20/2023 0904   MCHC 34.2 02/20/2023 0904   RDW 13.2 02/20/2023 0904   LYMPHSABS 1.6 03/20/2021 1428   MONOABS 1.5 (H) 03/20/2021 1428   EOSABS 0.0 03/20/2021 1428   BASOSABS 0.0 03/20/2021 1428    BMET    Component Value Date/Time   NA 140 03/01/2022 0518   K 3.8 03/01/2022 0518   CL 106 03/01/2022 0518   CO2 28 03/01/2022 0518   GLUCOSE 133 (H)  03/01/2022 0518   BUN 11 02/18/2023 1231   CREATININE 0.54 02/18/2023 1231   CALCIUM 8.7 (L) 03/01/2022 0518   GFRNONAA >60 02/18/2023 1231   GFRAA >60 12/02/2019 1055    INR    Component Value Date/Time   INR 1.2 02/28/2022 1416     Intake/Output Summary (Last 24 hours) at 02/20/2023 1101 Last data filed at 02/20/2023 1030 Gross per 24 hour  Intake 677.1 ml  Output 800 ml  Net -122.9 ml     Assessment/Plan:  67 y.o. female is s/p right lower extremity angiogram with lysis catheter, mechanical thrombectomy, angioplasty with covered stent placement.  1 Day Post-Op   PLAN: Vascular surgery was unsuccessful in being able to correct or maintain adequate blood flow to the patient's right lower extremity.  Patient's right lower extremity is cold from mid calf to her toes.  Patient is endorsing soreness but denies any pain this morning.  I had a discussion with the patient this morning about amputation of her right lower extremity on Friday and she would rather wait at this point in time.  Dr. [name] MD also had the same conversation with her.  Her response was the same.  No plans for further surgery such as right below-knee amputation at this time.  Will continue to monitor patient's pain level.  Patient will continue on  heparin infusion for possible surgery later this week or next week.   DVT prophylaxis: Heparin infusion.   [facility] 02/20/2023 11:01 AM

## 2023-02-20 NOTE — Consult Note (Signed)
ANTICOAGULATION CONSULT NOTE - Initial Consult  Pharmacy Consult for Heparin  Indication: PAD/ischemic limb  No Known Allergies  Patient Measurements: Height: 5\' 3"  (160 cm) Weight: 58.6 kg (129 lb 3 oz) IBW/kg (Calculated) : 52.4 Heparin Dosing Weight: 58.6  Vital Signs: Temp: 98 F (36.7 C) (09/25 1454) Temp Source: Oral (09/25 0718) BP: 127/65 (09/25 1454) Pulse Rate: 89 (09/25 1454)  Labs: Recent Labs    02/18/23 1231 02/18/23 1753 02/18/23 2303 02/19/23 0410 02/20/23 0904 02/20/23 1711  HGB  --    < > 12.2 12.1 10.9*  --   HCT  --    < > 37.6 35.5* 31.9*  --   PLT  --    < > 220 207 163  --   APTT  --   --   --   --  26  --   HEPARINUNFRC  --    < > <0.10* <0.10* <0.10* 0.27*  CREATININE 0.54  --   --   --   --   --    < > = values in this interval not displayed.   Estimated Creatinine Clearance: 55.7 mL/min (by C-G formula based on SCr of 0.54 mg/dL).  Medical History: Past Medical History:  Diagnosis Date   Hyperlipidemia    Hypertension    Peripheral vascular disease (HCC)    Syncope    Medications:  Medications Prior to Admission  Medication Sig Dispense Refill Last Dose   amLODipine (NORVASC) 10 MG tablet Take 1 tablet by mouth once daily 30 tablet 0 02/18/2023 at 0830   aspirin EC 81 MG tablet Take 81 mg by mouth daily. Swallow whole.   02/14/2023   atorvastatin (LIPITOR) 20 MG tablet Take 20 mg by mouth daily.   02/18/2023   cholecalciferol (VITAMIN D3) 25 MCG (1000 UNIT) tablet Take 1,000 Units by mouth daily.   02/18/2023 at 0830   HYDROcodone-acetaminophen (NORCO/VICODIN) 5-325 MG tablet Take 1 tablet by mouth every 6 (six) hours as needed for moderate pain. 20 tablet 0 02/18/2023 at 0830   metoprolol tartrate (LOPRESSOR) 25 MG tablet Take 1 tablet (25 mg total) by mouth 2 (two) times daily. 60 tablet 3 02/18/2023 at 0830   rivaroxaban (XARELTO) 20 MG TABS tablet Take 1 tablet (20 mg total) by mouth daily. 30 tablet 3 02/14/2023   Assessment: Yvonne [name] is a 67 y.o. female admitted for surgery for RLE angiography. Her PMH includes peripheral vascular disease, hyperlipidemia, HTN, and syncope. Pharmacy has been consulted to dose heparin for DVT. Patient was on Xarelto anticoagulation prior to admission, which has been held since 02/14/23. Will monitor/follow aPTT for correlation with heparin level, then will monitor only heparin levels thereafter.  H&H and platelets have been downtrending since admission.  Goal of Therapy:  Heparin level 0.3-0.7 units/ml aPTT 66-102 seconds Monitor platelets by anticoagulation protocol: Yes   Labs: Date Time HL   9/25 0904 <0.10   09/25   1711    0.27  Plan:  Give bolus of 900 units x 1 Increase infusion rate to 1050 units/hr Check next HL 6 hours after rate increase Continue to monitor H/H and platelets  Littie Deeds, PharmD PGY1 Pharmacy Resident

## 2023-02-20 NOTE — Progress Notes (Signed)
[facility]  Daily Progress Note   Subjective  -   Still having a lot of right foot pain.  Very tender to the touch and dangles her foot for some relief.  Objective Vitals:   02/20/23 0500 02/20/23 0600 02/20/23 0700 02/20/23 0718  BP: (!) 143/70 (!) 113/59 134/86   Pulse: 79 79 88 92  Resp:  18 17   Temp:    98.1 F (36.7 C)  TempSrc:    Oral  SpO2: (!) 88% 93% 92% 96%  Weight:      Height:        Intake/Output Summary (Last 24 hours) at 02/20/2023 0748 Last data filed at 02/20/2023 0725 Gross per 24 hour  Intake 594.43 ml  Output 1100 ml  Net -505.57 ml    PULM  CTAB CV  RRR VASC  right foot is dark and cool with no pulses present.  She is warm to the mid to lower calf.  Access site is without hematoma.  Laboratory CBC    Component Value Date/Time   WBC 8.5 02/19/2023 0410   HGB 12.1 02/19/2023 0410   HCT 35.5 (L) 02/19/2023 0410   PLT 207 02/19/2023 0410    BMET    Component Value Date/Time   NA 140 03/01/2022 0518   K 3.8 03/01/2022 0518   CL 106 03/01/2022 0518   CO2 28 03/01/2022 0518   GLUCOSE 133 (H) 03/01/2022 0518   BUN 11 02/18/2023 1231   CREATININE 0.54 02/18/2023 1231   CALCIUM 8.7 (L) 03/01/2022 0518   GFRNONAA >60 02/18/2023 1231   GFRAA >60 12/02/2019 1055    Assessment/Planning: POD #1/2 s/p angiogram with revascularization of the right lower extremity  Right foot remains fairly ischemic and she has extensive small vessel disease and limited flow distally even with recanalization of her SFA and popliteal stents.  I think she is likely going to require right below-knee amputation at this point. Patient going to see how she does today and see how tolerable the pain is which is very reasonable. Aggrastat has stopped.  Instead of starting oral anticoagulants, I will put on a heparin drip in place as I think she will likely need surgery later this week. Okay to transfer out of the ICU and to the floor.   [name]  02/20/2023, 7:48 AM

## 2023-02-20 NOTE — Plan of Care (Signed)
  Problem: Education: Goal: Knowledge of General Education information will improve Description: Including pain rating scale, medication(s)/side effects and non-pharmacologic comfort measures Outcome: Progressing   Problem: Health Behavior/Discharge Planning: Goal: Ability to manage health-related needs will improve Outcome: Progressing   Problem: Clinical Measurements: Goal: Respiratory complications will improve Outcome: Progressing Goal: Cardiovascular complication will be avoided Outcome: Progressing   Problem: Coping: Goal: Level of anxiety will decrease Outcome: Progressing   Problem: Safety: Goal: Ability to remain free from injury will improve Outcome: Progressing   Problem: Skin Integrity: Goal: Risk for impaired skin integrity will decrease Outcome: Progressing   Problem: Education: Goal: Understanding of CV disease, CV risk reduction, and recovery process will improve Outcome: Progressing   Problem: Cardiovascular: Goal: Vascular access site(s) Level 0-1 will be maintained Outcome: Progressing

## 2023-02-20 NOTE — Progress Notes (Signed)
Pt A&OX4. Tolerating POs. Intermittently sitting on edge of bed to relieve leg pain. PRNs administered for pain. VSS. Heparin gtt started per orders. RLE tibial and pedal pulses absent and Dr. Wyn Quaker is aware.

## 2023-02-20 NOTE — Consult Note (Signed)
ANTICOAGULATION CONSULT NOTE - Initial Consult  Pharmacy Consult for Heparin  Indication: PAD/ischemic limb  No Known Allergies  Patient Measurements: Height: 5\' 3"  (160 cm) Weight: 58.6 kg (129 lb 3 oz) IBW/kg (Calculated) : 52.4 Heparin Dosing Weight: 58.6  Vital Signs: Temp: 99.1 F (37.3 C) (09/25 1134) Temp Source: Oral (09/25 0718) BP: 134/85 (09/25 1134) Pulse Rate: 80 (09/25 1134)  Labs: Recent Labs    02/18/23 1231 02/18/23 1753 02/18/23 2303 02/19/23 0410 02/20/23 0904  HGB  --    < > 12.2 12.1 10.9*  HCT  --    < > 37.6 35.5* 31.9*  PLT  --    < > 220 207 163  APTT  --   --   --   --  26  HEPARINUNFRC  --    < > <0.10* <0.10* <0.10*  CREATININE 0.54  --   --   --   --    < > = values in this interval not displayed.    Estimated Creatinine Clearance: 55.7 mL/min (by C-G formula based on SCr of 0.54 mg/dL).   Medical History: Past Medical History:  Diagnosis Date   Hyperlipidemia    Hypertension    Peripheral vascular disease (HCC)    Syncope     Medications:  Medications Prior to Admission  Medication Sig Dispense Refill Last Dose   amLODipine (NORVASC) 10 MG tablet Take 1 tablet by mouth once daily 30 tablet 0 02/18/2023 at 0830   aspirin EC 81 MG tablet Take 81 mg by mouth daily. Swallow whole.   02/14/2023   atorvastatin (LIPITOR) 10 MG tablet Take 10 mg by mouth daily.   02/18/2023 at 0830   cholecalciferol (VITAMIN D3) 25 MCG (1000 UNIT) tablet Take 1,000 Units by mouth daily.   02/18/2023 at 0830   HYDROcodone-acetaminophen (NORCO/VICODIN) 5-325 MG tablet Take 1 tablet by mouth every 6 (six) hours as needed for moderate pain. 20 tablet 0 02/18/2023 at 0830   metoprolol tartrate (LOPRESSOR) 25 MG tablet Take 1 tablet (25 mg total) by mouth 2 (two) times daily. 60 tablet 3 02/18/2023 at 0830   rivaroxaban (XARELTO) 20 MG TABS tablet Take 1 tablet (20 mg total) by mouth daily. 30 tablet 3 02/14/2023    Assessment: Yvonne [name] is a 67 y.o. female  admitted for surgery for RLE angiography. Her PMH includes peripheral vascular disease, hyperlipidemia, HTN, and syncope. Pharmacy has been consulted to dose heparin for DVT. Patient was on Xarelto anticoagulation prior to admission, which has been held since 02/14/23. Will monitor/follow aPTT for correlation with heparin level, then will monitor only heparin levels thereafter.  H&H and platelets have been downtrending since admission.  Goal of Therapy:  Heparin level 0.3-0.7 units/ml aPTT 66-102 seconds Monitor platelets by anticoagulation protocol: Yes   Labs: Date Time aPTT Comment 9/25 0904 26 Below goal   Plan:  Heparin bolus of 3,500 units  Start heparin infusion at 950 units/hr Check aPTT level 6 hours after infusion start Check heparin level daily for correlation Continue to monitor H&H and platelets  Darolyn Rua, PharmD Student [employer]

## 2023-02-21 LAB — CBC
HCT: 33.8 % — ABNORMAL LOW (ref 36.0–46.0)
Hemoglobin: 11.1 g/dL — ABNORMAL LOW (ref 12.0–15.0)
MCH: 32 pg (ref 26.0–34.0)
MCHC: 32.8 g/dL (ref 30.0–36.0)
MCV: 97.4 fL (ref 80.0–100.0)
Platelets: 162 10*3/uL (ref 150–400)
RBC: 3.47 MIL/uL — ABNORMAL LOW (ref 3.87–5.11)
RDW: 13.3 % (ref 11.5–15.5)
WBC: 14.9 10*3/uL — ABNORMAL HIGH (ref 4.0–10.5)
nRBC: 0 % (ref 0.0–0.2)

## 2023-02-21 LAB — HEPARIN LEVEL (UNFRACTIONATED)
Heparin Unfractionated: 0.27 IU/mL — ABNORMAL LOW (ref 0.30–0.70)
Heparin Unfractionated: <0.1 IU/mL — ABNORMAL LOW (ref 0.30–0.70)

## 2023-02-21 MED ORDER — ATORVASTATIN CALCIUM 20 MG PO TABS: 20.0000 mg | ORAL_TABLET | Freq: Every day | ORAL | 11 refills | Status: DC

## 2023-02-21 MED ORDER — RIVAROXABAN 10 MG PO TABS
10.0000 mg | ORAL_TABLET | Freq: Once | ORAL | Status: AC
  Administered 2023-02-21: 10 mg via ORAL
  Filled 2023-02-21: qty 1

## 2023-02-21 MED ORDER — HEPARIN BOLUS VIA INFUSION
900.0000 [IU] | Freq: Once | INTRAVENOUS | Status: AC
  Administered 2023-02-21: 900 [IU] via INTRAVENOUS
  Filled 2023-02-21: qty 900

## 2023-02-21 MED ORDER — OXYCODONE HCL 5 MG PO CAPS: 5.0000 mg | ORAL_CAPSULE | ORAL | 0 refills | Status: DC | PRN

## 2023-02-21 MED ORDER — HEPARIN BOLUS VIA INFUSION
1800.0000 [IU] | Freq: Once | INTRAVENOUS | Status: AC
  Administered 2023-02-21: 1800 [IU] via INTRAVENOUS
  Filled 2023-02-21: qty 1800

## 2023-02-21 NOTE — Consult Note (Signed)
ANTICOAGULATION CONSULT NOTE - Initial Consult  Pharmacy Consult for Heparin  Indication: PAD/ischemic limb  No Known Allergies  Patient Measurements: Height: 5\' 3"  (160 cm) Weight: 58.6 kg (129 lb 3 oz) IBW/kg (Calculated) : 52.4 Heparin Dosing Weight: 58.6  Vital Signs: Temp: 100 F (37.8 C) (09/25 1957) BP: 132/63 (09/25 2152) Pulse Rate: 96 (09/25 2152)  Labs: Recent Labs    02/18/23 1231 02/18/23 1753 02/19/23 0410 02/20/23 0904 02/20/23 1711 02/21/23 0047  HGB  --    < > 12.1 10.9*  --  11.1*  HCT  --    < > 35.5* 31.9*  --  33.8*  PLT  --    < > 207 163  --  162  APTT  --   --   --  26  --   --   HEPARINUNFRC  --    < > <0.10* <0.10* 0.27* 0.27*  CREATININE 0.54  --   --   --   --   --    < > = values in this interval not displayed.   Estimated Creatinine Clearance: 55.7 mL/min (by C-G formula based on SCr of 0.54 mg/dL).  Medical History: Past Medical History:  Diagnosis Date   Hyperlipidemia    Hypertension    Peripheral vascular disease (HCC)    Syncope    Medications:  Medications Prior to Admission  Medication Sig Dispense Refill Last Dose   amLODipine (NORVASC) 10 MG tablet Take 1 tablet by mouth once daily 30 tablet 0 02/18/2023 at 0830   aspirin EC 81 MG tablet Take 81 mg by mouth daily. Swallow whole.   02/14/2023   atorvastatin (LIPITOR) 20 MG tablet Take 20 mg by mouth daily.   02/18/2023   cholecalciferol (VITAMIN D3) 25 MCG (1000 UNIT) tablet Take 1,000 Units by mouth daily.   02/18/2023 at 0830   HYDROcodone-acetaminophen (NORCO/VICODIN) 5-325 MG tablet Take 1 tablet by mouth every 6 (six) hours as needed for moderate pain. 20 tablet 0 02/18/2023 at 0830   metoprolol tartrate (LOPRESSOR) 25 MG tablet Take 1 tablet (25 mg total) by mouth 2 (two) times daily. 60 tablet 3 02/18/2023 at 0830   rivaroxaban (XARELTO) 20 MG TABS tablet Take 1 tablet (20 mg total) by mouth daily. 30 tablet 3 02/14/2023   Assessment: Yvonne [name] is a 67 y.o. female  admitted for surgery for RLE angiography. Her PMH includes peripheral vascular disease, hyperlipidemia, HTN, and syncope. Pharmacy has been consulted to dose heparin for DVT. Patient was on Xarelto anticoagulation prior to admission, which has been held since 02/14/23. Will monitor/follow aPTT for correlation with heparin level, then will monitor only heparin levels thereafter.  H&H and platelets have been downtrending since admission.  Goal of Therapy:  Heparin level 0.3-0.7 units/ml aPTT 66-102 seconds Monitor platelets by anticoagulation protocol: Yes   Labs: Date Time HL   9/25 0904 <0.10   09/25   1711    0.27 09/26   0047    0.27  Plan:  9/26:  HL @ 0047 = 0.27, SUBtherapeutic - Will order heparin 900 units IV X 1 bolus and increase drip rate to 1150 units/hr.  Check next HL 6 hours after rate increase Continue to monitor H/H and platelets  Izaia Say D

## 2023-02-21 NOTE — Consult Note (Signed)
ANTICOAGULATION CONSULT NOTE - Initial Consult  Pharmacy Consult for Heparin  Indication: PAD/ischemic limb  No Known Allergies  Patient Measurements: Height: 5\' 3"  (160 cm) Weight: 58.6 kg (129 lb 3 oz) IBW/kg (Calculated) : 52.4 Heparin Dosing Weight: 58.6  Vital Signs: Temp: 99.2 F (37.3 C) (09/26 0526) Temp Source: Oral (09/26 0526) BP: 136/54 (09/26 0352) Pulse Rate: 89 (09/26 0352)  Labs: Recent Labs    02/18/23 1231 02/18/23 1753 02/19/23 0410 02/20/23 0904 02/20/23 1711 02/21/23 0047  HGB  --    < > 12.1 10.9*  --  11.1*  HCT  --    < > 35.5* 31.9*  --  33.8*  PLT  --    < > 207 163  --  162  APTT  --   --   --  26  --   --   HEPARINUNFRC  --    < > <0.10* <0.10* 0.27* 0.27*  CREATININE 0.54  --   --   --   --   --    < > = values in this interval not displayed.   Estimated Creatinine Clearance: 55.7 mL/min (by C-G formula based on SCr of 0.54 mg/dL).  Medical History: Past Medical History:  Diagnosis Date   Hyperlipidemia    Hypertension    Peripheral vascular disease (HCC)    Syncope    Medications:  Medications Prior to Admission  Medication Sig Dispense Refill Last Dose   amLODipine (NORVASC) 10 MG tablet Take 1 tablet by mouth once daily 30 tablet 0 02/18/2023 at 0830   aspirin EC 81 MG tablet Take 81 mg by mouth daily. Swallow whole.   02/14/2023   atorvastatin (LIPITOR) 20 MG tablet Take 20 mg by mouth daily.   02/18/2023   cholecalciferol (VITAMIN D3) 25 MCG (1000 UNIT) tablet Take 1,000 Units by mouth daily.   02/18/2023 at 0830   HYDROcodone-acetaminophen (NORCO/VICODIN) 5-325 MG tablet Take 1 tablet by mouth every 6 (six) hours as needed for moderate pain. 20 tablet 0 02/18/2023 at 0830   metoprolol tartrate (LOPRESSOR) 25 MG tablet Take 1 tablet (25 mg total) by mouth 2 (two) times daily. 60 tablet 3 02/18/2023 at 0830   rivaroxaban (XARELTO) 20 MG TABS tablet Take 1 tablet (20 mg total) by mouth daily. 30 tablet 3 02/14/2023    Assessment: Yvonne [name] is a 67 y.o. female admitted for surgery for RLE angiography. Her PMH includes peripheral vascular disease, hyperlipidemia, HTN, and syncope. Pharmacy has been consulted to dose heparin for DVT. Patient was on Xarelto anticoagulation prior to admission, which has been held since 02/14/23. Will monitor/follow aPTT for correlation with heparin level, then will monitor only heparin levels thereafter.  Goal of Therapy:  Heparin level 0.3-0.7 units/ml Monitor platelets by anticoagulation protocol: Yes   Plan: heparin level undetectable: no significant interruptions per RN Will order heparin 1800 units IV X 1 bolus and increase drip rate to 1400 units/hr.  Check next heparin level 6 hours after rate increase Continue to monitor H/H and platelets  Lowella Bandy

## 2023-02-21 NOTE — TOC Progression Note (Signed)
Transition of Care [facility]) - Progression Note    Patient Details  Name: Yvonne [name] MRN: [id number] Date of Birth: March 27, 1956  Transition of Care [facility]) CM/SW Contact  Darolyn Rua, [place] Phone Number: 02/21/2023, 2:21 PM  Clinical Narrative:     Patient to dc today with medication management, refusing right below knee amputation at this time until she decides.   No TOC needs.        Expected Discharge Plan and Services         Expected Discharge Date: 02/21/23                                     Social Determinants of Health (SDOH) Interventions SDOH Screenings   Food Insecurity: Food Insecurity Present (02/18/2023)  Housing: Patient Declined (02/18/2023)  Transportation Needs: No Transportation Needs (02/18/2023)  Utilities: Not At Risk (02/18/2023)  Tobacco Use: Medium Risk (02/18/2023)    Readmission Risk Interventions     No data to display

## 2023-02-21 NOTE — Care Management Important Message (Signed)
Important Message  Patient Details  Name: Yvonne [name] MRN: [id number] Date of Birth: 1955-10-04   Important Message Given:  Yes - Medicare IM     [name] 02/21/2023, 12:38 PM

## 2023-02-21 NOTE — Discharge Summary (Signed)
[facility]    Discharge Summary    Patient ID:  Yvonne [name] MRN: [id number] DOB/AGE: Feb 17, 1956 67 y.o.  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  Unfortunately lysis therapy was unsuccessful.  Patient has very limited blood supply to her right lower extremity.  She is requiring a right below the knee amputation.  At this time she refuses to have a right below the knee amputation and wishes to be discharged home on pain medicine until she can decide what to do.  Patient was scheduled to have left lower extremity angiogram with possible intervention on Monday, 02/25/2023 but this has been canceled due to the need for right lower extremity surgery first per Dr. [name] MD.  Patient is recovering from the procedure as expected.  Vitals all remained stable.  Patient discharge will need to continue ASA 81 mg daily, Xarelto 20 mg daily, and Lipitor 20 mg daily.  Hospital Course:  Yvonne [name] is a 66 y.o. female is S/P Right Extubated: POD # 0 Physical Exam:  Alert notes x3, no acute distress Face: Symmetrical.  Tongue is midline. Neck: Trachea is midline.  No swelling or bruising. Cardiovascular: Regular rate and rhythm Pulmonary: Clear to auscultation bilaterally Abdomen: Soft, nontender, non distended Left groin access: Clean dry and intact.  No swelling or drainage noted Left lower extremity: Thigh soft.  Calf soft.  Extremities  warm distally toes. Hard to palpate pedal pulses however the foot is warm is her good capillary refill. Right lower extremity: Thigh soft.  Calf soft.  Extremities cool distally toes. Unable to palpate pedal pulses and the left foot is cool with poor capillary refill. Neurological: No deficits noted   Post-op wounds:  clean, dry, intact or healing well  Pt. Ambulating, voiding and taking PO diet without difficulty. Pt pain controlled with PO pain meds.  Labs:  As below  Complications: none  Consults:    Significant Diagnostic Studies: CBC Lab Results  Component Value Date   WBC 14.9 (H) 02/21/2023   HGB 11.1 (L) 02/21/2023   HCT 33.8 (L) 02/21/2023   MCV 97.4 02/21/2023   PLT 162 02/21/2023    BMET    Component Value Date/Time   NA 140 03/01/2022 0518   K 3.8 03/01/2022 0518   CL 106 03/01/2022 0518   CO2 28 03/01/2022 0518   GLUCOSE 133 (H) 03/01/2022 0518   BUN 11 02/18/2023 1231   CREATININE 0.54 02/18/2023 1231   CALCIUM 8.7 (L) 03/01/2022 0518   GFRNONAA >60 02/18/2023 1231   GFRAA >60 12/02/2019 1055   COAG Lab Results  Component Value Date   INR 1.2 02/28/2022   INR 1.2 02/15/2022   INR 1.2 10/27/2021     Disposition:  Discharge to :Home  Allergies as of 02/21/2023   No Known Allergies      Medication List     STOP taking these medications    HYDROcodone-acetaminophen 5-325 MG tablet Commonly known as: NORCO/VICODIN  TAKE these medications    amLODipine 10 MG tablet Commonly known as: NORVASC Take 1 tablet by mouth once daily   aspirin EC 81 MG tablet Take 81 mg by mouth daily. Swallow whole.   atorvastatin 20 MG tablet Commonly known as: LIPITOR Take 20 mg by mouth daily. What changed: Another medication with the same name was changed. Make sure you understand how and when to take each.   atorvastatin 20 MG tablet Commonly known as: LIPITOR Take 1 tablet (20 mg total) by mouth daily. Start taking on: February 22, 2023 What changed:  medication strength how much to take   cholecalciferol 25 MCG (1000 UNIT) tablet Commonly known as: VITAMIN D3 Take 1,000 Units by mouth daily.   metoprolol tartrate 25 MG tablet Commonly known as: LOPRESSOR Take 1 tablet (25 mg total) by mouth 2 (two) times daily.   oxycodone 5 MG capsule Commonly known as: OXY-IR Take 1 capsule (5 mg total) by mouth every 4 (four) hours as needed.   rivaroxaban 20 MG Tabs tablet Commonly known as: XARELTO Take 1 tablet (20 mg total) by mouth daily.       Verbal and written Discharge instructions given to the patient. Wound care per Discharge AVS  Follow-up Information     Georgiana Spinner, NP Follow up in 1 month(s).   Specialty: Vascular Surgery Why: Discuss Amputation Contact information: [address] [place] [address] [phone]                 Signed: Marcie Bal, NP  02/21/2023, 11:56 AM

## 2023-02-21 NOTE — Plan of Care (Signed)

## 2023-02-21 NOTE — Progress Notes (Signed)
Alexander RN aware of order to remove CVC.

## 2023-02-21 NOTE — Discharge Instructions (Signed)
No heavy lifting for the next 2 weeks.  Do not lift anything more than a gallon of milk.  No driving while taking pain medications.  Especially narcotic pain medication.  He will be discharged on aspirin 81 mg daily, Xarelto 20 mg daily, and Lipitor 20 mg daily.  Please do not miss any of these medications they are vital to your vascular blood supply to your lower extremities.  Follow-up with vein and vascular surgery as needed.

## 2023-02-25 ENCOUNTER — Encounter: Admission: RE | Payer: Self-pay | Source: Ambulatory Visit

## 2023-02-25 ENCOUNTER — Ambulatory Visit: Admission: RE | Admit: 2023-02-25 | Payer: Medicare HMO | Source: Ambulatory Visit | Admitting: Vascular Surgery

## 2023-02-25 DIAGNOSIS — L97909 Non-pressure chronic ulcer of unspecified part of unspecified lower leg with unspecified severity: Secondary | ICD-10-CM

## 2023-02-25 SURGERY — LOWER EXTREMITY ANGIOGRAPHY
Anesthesia: Moderate Sedation | Site: Leg Lower | Laterality: Left

## 2023-03-20 ENCOUNTER — Other Ambulatory Visit (INDEPENDENT_AMBULATORY_CARE_PROVIDER_SITE_OTHER): Payer: Self-pay | Admitting: Nurse Practitioner

## 2023-03-20 ENCOUNTER — Telehealth (INDEPENDENT_AMBULATORY_CARE_PROVIDER_SITE_OTHER): Payer: Self-pay

## 2023-03-20 MED ORDER — OXYCODONE HCL 5 MG PO CAPS: 5.0000 mg | ORAL_CAPSULE | ORAL | 0 refills | Status: DC | PRN

## 2023-03-20 NOTE — Telephone Encounter (Signed)
Patient called requesting a refill for Oxycodone 5 mg.   Please advise

## 2023-03-20 NOTE — Telephone Encounter (Signed)
Message given

## 2023-03-20 NOTE — Telephone Encounter (Signed)
Refill sent.

## 2023-03-26 ENCOUNTER — Encounter (INDEPENDENT_AMBULATORY_CARE_PROVIDER_SITE_OTHER): Payer: Self-pay | Admitting: Vascular Surgery

## 2023-03-26 ENCOUNTER — Ambulatory Visit (INDEPENDENT_AMBULATORY_CARE_PROVIDER_SITE_OTHER): Payer: Medicare HMO | Admitting: Vascular Surgery

## 2023-03-26 VITALS — BP 135/85 | HR 62 | Resp 18 | Ht 63.0 in | Wt 120.4 lb

## 2023-03-26 DIAGNOSIS — E782 Mixed hyperlipidemia: Secondary | ICD-10-CM | POA: Diagnosis not present

## 2023-03-26 DIAGNOSIS — I998 Other disorder of circulatory system: Secondary | ICD-10-CM

## 2023-03-26 DIAGNOSIS — I10 Essential (primary) hypertension: Secondary | ICD-10-CM | POA: Diagnosis not present

## 2023-03-26 NOTE — Progress Notes (Signed)
MRN : [id number]  Yvonne [name] is a 67 y.o. (Jan 07, 1956) female who presents with chief complaint of  Chief Complaint  Patient presents with   Follow-up    1 month (03/25/2023); Discuss Amputation  .  History of Present Illness: Patient returns today in follow up of her right lower extremity ischemia.  Her pain is usually somewhere in the 2-4 range and seems to be tolerating the ischemic right leg fairly well.  She does have gangrenous changes on the distal aspect of the right fifth toe and this is dry and stable.  There is no significant drainage or erythema.  Her foot is warm to the ankle and heel area but is fairly cool in the forefoot.  She had known severe small vessel disease with no good distal target for bypass and is had many previous revascularization procedures performed percutaneously.  She was started on anticoagulation and told to try to walk is much as possible but that a below-knee amputation would likely be required of her pain was intolerable or if she developed an infection.  To this point, those have not occurred.  Current Outpatient Medications  Medication Sig Dispense Refill   amLODipine (NORVASC) 10 MG tablet Take 1 tablet by mouth once daily 30 tablet 0   aspirin EC 81 MG tablet Take 81 mg by mouth daily. Swallow whole.     atorvastatin (LIPITOR) 20 MG tablet Take 20 mg by mouth daily.     atorvastatin (LIPITOR) 20 MG tablet Take 1 tablet (20 mg total) by mouth daily. 30 tablet 11   cholecalciferol (VITAMIN D3) 25 MCG (1000 UNIT) tablet Take 1,000 Units by mouth daily.     metoprolol tartrate (LOPRESSOR) 25 MG tablet Take 1 tablet (25 mg total) by mouth 2 (two) times daily. 60 tablet 3   oxyCODONE (OXY IR/ROXICODONE) 5 MG immediate release tablet Take 1 tablet (5 mg total) by mouth every 8 (eight) hours as needed for severe pain (pain score 7-10). 30 tablet 0   rivaroxaban (XARELTO) 20 MG TABS tablet Take 1 tablet (20 mg total) by mouth daily. 30 tablet 3   No  current facility-administered medications for this visit.    Past Medical History:  Diagnosis Date   Hyperlipidemia    Hypertension    Peripheral vascular disease (HCC)    Syncope     Past Surgical History:  Procedure Laterality Date   LOWER EXTREMITY ANGIOGRAPHY Right 10/02/2021   Procedure: Lower Extremity Angiography;  Surgeon: Annice Needy, MD;  Location: [facility];  Service: Cardiovascular;  Laterality: Right;   LOWER EXTREMITY ANGIOGRAPHY Left 10/09/2021   Procedure: Lower Extremity Angiography;  Surgeon: Annice Needy, MD;  Location: [facility];  Service: Cardiovascular;  Laterality: Left;   LOWER EXTREMITY ANGIOGRAPHY Right 10/27/2021   Procedure: Lower Extremity Angiography;  Surgeon: Annice Needy, MD;  Location: [facility];  Service: Cardiovascular;  Laterality: Right;   LOWER EXTREMITY ANGIOGRAPHY Right 02/15/2022   Procedure: Lower Extremity Angiography;  Surgeon: Annice Needy, MD;  Location: [facility];  Service: Cardiovascular;  Laterality: Right;   LOWER EXTREMITY ANGIOGRAPHY Right 02/28/2022   Procedure: Lower Extremity Angiography;  Surgeon: Annice Needy, MD;  Location: [facility];  Service: Cardiovascular;  Laterality: Right;   LOWER EXTREMITY ANGIOGRAPHY Right 02/18/2023   Procedure: Lower Extremity Angiography;  Surgeon: Annice Needy, MD;  Location: [facility];  Service: Cardiovascular;  Laterality: Right;  LOWER EXTREMITY ANGIOGRAPHY Right 02/19/2023   Procedure: Lower Extremity Angiography;  Surgeon: Annice Needy, MD;  Location: [facility];  Service: Cardiovascular;  Laterality: Right;   LOWER EXTREMITY INTERVENTION Right 10/27/2021   Procedure: LOWER EXTREMITY INTERVENTION;  Surgeon: Annice Needy, MD;  Location: [facility];  Service: Cardiovascular;  Laterality: Right;   LOWER EXTREMITY INTERVENTION Right 02/15/2022   Procedure: LOWER EXTREMITY INTERVENTION;  Surgeon: Annice Needy, MD;  Location:  [facility];  Service: Cardiovascular;  Laterality: Right;   PARTIAL HYSTERECTOMY     PERIPHERAL VASCULAR THROMBECTOMY Right 03/01/2022   Procedure: PERIPHERAL VASCULAR THROMBECTOMY;  Surgeon: Annice Needy, MD;  Location: [facility];  Service: Cardiovascular;  Laterality: Right;     Social History   Tobacco Use   Smoking status: Former    Current packs/day: 0.00    Types: Cigarettes    Quit date: 10/27/2021    Years since quitting: 1.4   Smokeless tobacco: Never  Vaping Use   Vaping status: Never Used  Substance Use Topics   Drug use: Never      Family History No bleeding or clotting disorders No autoimmune diseases  No Known Allergies   REVIEW OF SYSTEMS (Negative unless checked)  Constitutional: [] Weight loss  [] Fever  [] Chills Cardiac: [] Chest pain   [] Chest pressure   [] Palpitations   [] Shortness of breath when laying flat   [] Shortness of breath at rest   [] Shortness of breath with exertion. Vascular:  [x] Pain in legs with walking   [x] Pain in legs at rest   [] Pain in legs when laying flat   [] Claudication   [] Pain in feet when walking  [x] Pain in feet at rest  [] Pain in feet when laying flat   [] History of DVT   [] Phlebitis   [] Swelling in legs   [] Varicose veins   [x] Non-healing ulcers Pulmonary:   [] Uses home oxygen   [] Productive cough   [] Hemoptysis   [] Wheeze  [] COPD   [] Asthma Neurologic:  [] Dizziness  [] Blackouts   [] Seizures   [] History of stroke   [] History of TIA  [] Aphasia   [] Temporary blindness   [] Dysphagia   [] Weakness or numbness in arms   [] Weakness or numbness in legs Musculoskeletal:  [] Arthritis   [] Joint swelling   [] Joint pain   [] Low back pain Hematologic:  [] Easy bruising  [] Easy bleeding   [] Hypercoagulable state   [] Anemic   Gastrointestinal:  [] Blood in stool   [] Vomiting blood  [] Gastroesophageal reflux/heartburn   [] Abdominal pain Genitourinary:  [] Chronic kidney disease   [] Difficult urination  [] Frequent urination   [] Burning with urination   [] Hematuria Skin:  [] Rashes   [x] Ulcers   [x] Wounds Psychological:  [] History of anxiety   []  History of major depression.  Physical Examination  BP 135/85 (BP Location: Right Arm)   Pulse 62   Resp 18   Ht 5\' 3"  (1.6 m)   Wt 120 lb 6.4 oz (54.6 kg)   BMI 21.33 kg/m  Gen:   NAD, thin and frail appearing Head: [place]/AT, + temporalis wasting. Ear/Nose/Throat: Hearing grossly intact, nares w/o erythema or drainage Eyes: Conjunctiva clear. Sclera non-icteric Neck: Supple.  Trachea midline Pulmonary:  Good air movement, no use of accessory muscles.  Cardiac: RRR, no JVD Vascular:  Vessel Right Left  Radial Palpable Palpable                          PT Not Palpable Not  Palpable  DP Not Palpable 1+ Palpable   Gastrointestinal: soft, non-tender/non-distended. No guarding/reflex.  Musculoskeletal: M/S 5/5 throughout.  No deformity or atrophy.  Right foot is cool from the midfoot distally but warm in the heel and ankle area.  She has dry gangrenous changes to the right fifth toe without erythema or drainage.  No significant lower extremity edema. Neurologic: Sensation grossly intact in extremities.  Symmetrical.  Speech is fluent.  Psychiatric: Judgment intact, Mood & affect appropriate for pt's clinical situation. Dermatologic: Right fifth toe as above      Labs Recent Results (from the past 2160 hour(s))  VAS [place] ABI WITH/WO TBI     Status: None   Collection Time: 01/29/23 11:33 AM  Result Value Ref Range   Right ABI 0.41    Left ABI 0.65   BUN     Status: None   Collection Time: 02/18/23 12:31 PM  Result Value Ref Range   BUN 11 8 - 23 mg/dL    Comment: Performed at [facility], [address] [place], [place] [address]  Creatinine, serum     Status: None   Collection Time: 02/18/23 12:31 PM  Result Value Ref Range   Creatinine, Ser 0.54 0.44 - 1.00 mg/dL   GFR, Estimated >60 >45 mL/min    Comment: (NOTE) Calculated using the  CKD-EPI Creatinine Equation (2021) Performed at [facility], [address] [place], [place] [address]   Glucose, capillary     Status: Abnormal   Collection Time: 02/18/23  5:34 PM  Result Value Ref Range   Glucose-Capillary 106 (H) 70 - 99 mg/dL    Comment: Glucose reference range applies only to samples taken after fasting for at least 8 hours.  Heparin level (unfractionated) every 6 hours x 4 post-procedure     Status: Abnormal   Collection Time: 02/18/23  5:53 PM  Result Value Ref Range   Heparin Unfractionated 0.23 (L) 0.30 - 0.70 IU/mL    Comment: (NOTE) The clinical reportable range upper limit is being lowered to >1.10 to align with the FDA approved guidance for the current laboratory assay.  If heparin results are below expected values, and patient dosage has  been confirmed, suggest follow up testing of antithrombin III levels. Performed at [facility], [address] [place], [place] [address]   CBC every 6 hours x 4 post-procedure     Status: Abnormal   Collection Time: 02/18/23  5:53 PM  Result Value Ref Range   WBC 7.2 4.0 - 10.5 K/uL   RBC 3.83 (L) 3.87 - 5.11 MIL/uL   Hemoglobin 12.2 12.0 - 15.0 g/dL   HCT 82.9 56.2 - 13.0 %   MCV 97.4 80.0 - 100.0 fL   MCH 31.9 26.0 - 34.0 pg   MCHC 32.7 30.0 - 36.0 g/dL   RDW 86.5 78.4 - 69.6 %   Platelets 265 150 - 400 K/uL   nRBC 0.0 0.0 - 0.2 %    Comment: Performed at [facility], [address] [place], [place] [address]  Fibrinogen every 6 hours x 4 post-procedure     Status: None   Collection Time: 02/18/23  5:53 PM  Result Value Ref Range   Fibrinogen 305 210 - 475 mg/dL    Comment: (NOTE) Fibrinogen results may be underestimated in patients receiving thrombolytic therapy. Performed at [facility], [address] [place], [place] [address]   Heparin level (unfractionated) every 6 hours x 4 post-procedure  Status: Abnormal   Collection Time: 02/18/23 11:03 PM   Result Value Ref Range   Heparin Unfractionated <0.10 (L) 0.30 - 0.70 IU/mL    Comment: Performed at [facility], [address] [place], [place] [address]  CBC every 6 hours x 4 post-procedure     Status: Abnormal   Collection Time: 02/18/23 11:03 PM  Result Value Ref Range   WBC 9.5 4.0 - 10.5 K/uL   RBC 3.83 (L) 3.87 - 5.11 MIL/uL   Hemoglobin 12.2 12.0 - 15.0 g/dL   HCT 60.4 54.0 - 98.1 %   MCV 98.2 80.0 - 100.0 fL   MCH 31.9 26.0 - 34.0 pg   MCHC 32.4 30.0 - 36.0 g/dL   RDW 19.1 47.8 - 29.5 %   Platelets 220 150 - 400 K/uL   nRBC 0.0 0.0 - 0.2 %    Comment: Performed at [facility], [address] [place], [place] [address]  Fibrinogen every 6 hours x 4 post-procedure     Status: Abnormal   Collection Time: 02/18/23 11:03 PM  Result Value Ref Range   Fibrinogen 124 (L) 210 - 475 mg/dL    Comment: (NOTE) Fibrinogen results may be underestimated in patients receiving thrombolytic therapy. Performed at [facility], [address] [place], [place] [address]   Heparin level (unfractionated) every 6 hours x 4 post-procedure     Status: Abnormal   Collection Time: 02/19/23  4:10 AM  Result Value Ref Range   Heparin Unfractionated <0.10 (L) 0.30 - 0.70 IU/mL    Comment: REPEATED TO VERIFY (NOTE) The clinical reportable range upper limit is being lowered to >1.10 to align with the FDA approved guidance for the current laboratory assay.  If heparin results are below expected values, and patient dosage has  been confirmed, suggest follow up testing of antithrombin III levels. Performed at [facility], [address] [place], [place] [address]   CBC every 6 hours x 4 post-procedure     Status: Abnormal   Collection Time: 02/19/23  4:10 AM  Result Value Ref Range   WBC 8.5 4.0 - 10.5 K/uL   RBC 3.67 (L) 3.87 - 5.11 MIL/uL   Hemoglobin 12.1 12.0 - 15.0 g/dL   HCT 95.2 (L) 84.1 - 32.4 %   MCV 96.7 80.0 - 100.0 fL   MCH 33.0  26.0 - 34.0 pg   MCHC 34.1 30.0 - 36.0 g/dL   RDW 40.1 02.7 - 25.3 %   Platelets 207 150 - 400 K/uL   nRBC 0.0 0.0 - 0.2 %    Comment: Performed at [facility], [address] [place], [place] [address]  Fibrinogen every 6 hours x 4 post-procedure     Status: Abnormal   Collection Time: 02/19/23  4:10 AM  Result Value Ref Range   Fibrinogen 119 (L) 210 - 475 mg/dL    Comment: (NOTE) Fibrinogen results may be underestimated in patients receiving thrombolytic therapy. Performed at [facility], [address] [place], [place] [address]   Lipid panel     Status: None   Collection Time: 02/19/23  4:10 AM  Result Value Ref Range   Cholesterol 144 0 - 200 mg/dL   Triglycerides 80 <595 mg/dL   HDL 65 >63 mg/dL   Total CHOL/HDL Ratio 2.2 RATIO   VLDL 16 0 - 40 mg/dL   LDL Cholesterol 63 0 - 99 mg/dL    Comment:  Total Cholesterol/HDL:CHD Risk Coronary Heart Disease Risk Table                     Men   Women  1/2 Average Risk   3.4   3.3  Average Risk       5.0   4.4  2 X Average Risk   9.6   7.1  3 X Average Risk  23.4   11.0        Use the calculated Patient Ratio above and the CHD Risk Table to determine the patient's CHD Risk.        ATP III CLASSIFICATION (LDL):  <100     mg/dL   Optimal  161-096  mg/dL   Near or Above                    Optimal  130-159  mg/dL   Borderline  045-409  mg/dL   High  >811     mg/dL   Very High Performed at [facility], [address] [place], [place] [address]   APTT     Status: None   Collection Time: 02/20/23  9:04 AM  Result Value Ref Range   aPTT 26 24 - 36 seconds    Comment: Performed at [facility], [address] [place], [place] [address]  Heparin level (unfractionated)     Status: Abnormal   Collection Time: 02/20/23  9:04 AM  Result Value Ref Range   Heparin Unfractionated <0.10 (L) 0.30 - 0.70 IU/mL    Comment: (NOTE) The clinical reportable range upper limit is being  lowered to >1.10 to align with the FDA approved guidance for the current laboratory assay.  If heparin results are below expected values, and patient dosage has  been confirmed, suggest follow up testing of antithrombin III levels. Performed at [facility], [address] [place], [place] [address]   CBC     Status: Abnormal   Collection Time: 02/20/23  9:04 AM  Result Value Ref Range   WBC 10.8 (H) 4.0 - 10.5 K/uL   RBC 3.36 (L) 3.87 - 5.11 MIL/uL   Hemoglobin 10.9 (L) 12.0 - 15.0 g/dL   HCT 57.8 (L) 46.9 - 62.9 %   MCV 94.9 80.0 - 100.0 fL   MCH 32.4 26.0 - 34.0 pg   MCHC 34.2 30.0 - 36.0 g/dL   RDW 52.8 41.3 - 24.4 %   Platelets 163 150 - 400 K/uL   nRBC 0.0 0.0 - 0.2 %    Comment: Performed at [facility], [address] [place], [place] [address]  Heparin level (unfractionated)     Status: Abnormal   Collection Time: 02/20/23  5:11 PM  Result Value Ref Range   Heparin Unfractionated 0.27 (L) 0.30 - 0.70 IU/mL    Comment: (NOTE) The clinical reportable range upper limit is being lowered to >1.10 to align with the FDA approved guidance for the current laboratory assay.  If heparin results are below expected values, and patient dosage has  been confirmed, suggest follow up testing of antithrombin III levels. Performed at [facility], [address] [place], [place] [address]   CBC     Status: Abnormal   Collection Time: 02/21/23 12:47 AM  Result Value Ref Range   WBC 14.9 (H) 4.0 - 10.5 K/uL   RBC 3.47 (L) 3.87 - 5.11 MIL/uL   Hemoglobin 11.1 (L) 12.0 - 15.0 g/dL   HCT 44.0 (  L) 36.0 - 46.0 %   MCV 97.4 80.0 - 100.0 fL   MCH 32.0 26.0 - 34.0 pg   MCHC 32.8 30.0 - 36.0 g/dL   RDW 57.8 46.9 - 62.9 %   Platelets 162 150 - 400 K/uL   nRBC 0.0 0.0 - 0.2 %    Comment: Performed at [facility], [address] [place], [place] [address]  Heparin level (unfractionated)     Status: Abnormal   Collection Time: 02/21/23 12:47 AM   Result Value Ref Range   Heparin Unfractionated 0.27 (L) 0.30 - 0.70 IU/mL    Comment: (NOTE) The clinical reportable range upper limit is being lowered to >1.10 to align with the FDA approved guidance for the current laboratory assay.  If heparin results are below expected values, and patient dosage has  been confirmed, suggest follow up testing of antithrombin III levels. Performed at [facility], [address] [place], [place] [address]   Heparin level (unfractionated)     Status: Abnormal   Collection Time: 02/21/23  7:54 AM  Result Value Ref Range   Heparin Unfractionated <0.10 (L) 0.30 - 0.70 IU/mL    Comment: (NOTE) The clinical reportable range upper limit is being lowered to >1.10 to align with the FDA approved guidance for the current laboratory assay.  If heparin results are below expected values, and patient dosage has  been confirmed, suggest follow up testing of antithrombin III levels. Performed at [facility], [address] [place], [place] [address]     Radiology No results found.  Assessment/Plan  Benign essential HTN blood pressure control important in reducing the progression of atherosclerotic disease. On appropriate oral medications.   Hyperlipidemia, mixed lipid control important in reducing the progression of atherosclerotic disease. Continue statin therapy   Ischemic leg She was started on anticoagulation and told to try to walk is much as possible but that a below-knee amputation would likely be required of her pain was intolerable or if she developed an infection.  To this point, those have not occurred.  She really has no further revascularization options for the right leg.  If she can tolerate the discomfort and she does not develop infection, and the gangrenous toe auto amputate's and scabs over we may get by without having to perform a major amputation.  I would not recommend a transmetatarsal amputation or a digital  amputation is making a larger wound is going to be unlikely to heal with the poor perfusion.  If she develops infection or intolerable pain, she will need an amputation.  Otherwise we will plan to see her back in 4 to 6 weeks in follow-up.    [name], MD  03/26/2023 3:53 PM    This note was created with Dragon medical transcription system.  Any errors from dictation are purely unintentional

## 2023-03-26 NOTE — Assessment & Plan Note (Signed)
She was started on anticoagulation and told to try to walk is much as possible but that a below-knee amputation would likely be required of her pain was intolerable or if she developed an infection.  To this point, those have not occurred.  She really has no further revascularization options for the right leg.  If she can tolerate the discomfort and she does not develop infection, and the gangrenous toe auto amputate's and scabs over we may get by without having to perform a major amputation.  I would not recommend a transmetatarsal amputation or a digital amputation is making a larger wound is going to be unlikely to heal with the poor perfusion.  If she develops infection or intolerable pain, she will need an amputation.  Otherwise we will plan to see her back in 4 to 6 weeks in follow-up.

## 2023-03-26 NOTE — Assessment & Plan Note (Signed)
lipid control important in reducing the progression of atherosclerotic disease. Continue statin therapy  

## 2023-03-26 NOTE — Assessment & Plan Note (Signed)
blood pressure control important in reducing the progression of atherosclerotic disease. On appropriate oral medications.  

## 2023-04-09 DIAGNOSIS — I6523 Occlusion and stenosis of bilateral carotid arteries: Secondary | ICD-10-CM | POA: Diagnosis not present

## 2023-04-09 DIAGNOSIS — R55 Syncope and collapse: Secondary | ICD-10-CM | POA: Diagnosis not present

## 2023-04-15 DIAGNOSIS — R55 Syncope and collapse: Secondary | ICD-10-CM | POA: Diagnosis not present

## 2023-04-15 DIAGNOSIS — R002 Palpitations: Secondary | ICD-10-CM | POA: Diagnosis not present

## 2023-04-23 ENCOUNTER — Ambulatory Visit (INDEPENDENT_AMBULATORY_CARE_PROVIDER_SITE_OTHER): Payer: Medicare HMO | Admitting: Vascular Surgery

## 2023-04-30 ENCOUNTER — Ambulatory Visit (INDEPENDENT_AMBULATORY_CARE_PROVIDER_SITE_OTHER): Payer: Medicare HMO | Admitting: Vascular Surgery

## 2023-04-30 ENCOUNTER — Encounter (INDEPENDENT_AMBULATORY_CARE_PROVIDER_SITE_OTHER): Payer: Self-pay | Admitting: Vascular Surgery

## 2023-04-30 VITALS — BP 134/74 | HR 66 | Resp 15 | Wt 120.0 lb

## 2023-04-30 DIAGNOSIS — I998 Other disorder of circulatory system: Secondary | ICD-10-CM

## 2023-04-30 DIAGNOSIS — I70261 Atherosclerosis of native arteries of extremities with gangrene, right leg: Secondary | ICD-10-CM

## 2023-04-30 DIAGNOSIS — E782 Mixed hyperlipidemia: Secondary | ICD-10-CM

## 2023-04-30 DIAGNOSIS — I10 Essential (primary) hypertension: Secondary | ICD-10-CM | POA: Diagnosis not present

## 2023-04-30 DIAGNOSIS — I70269 Atherosclerosis of native arteries of extremities with gangrene, unspecified extremity: Secondary | ICD-10-CM | POA: Insufficient documentation

## 2023-04-30 MED ORDER — OXYCODONE-ACETAMINOPHEN 5-325 MG PO TABS
1.0000 | ORAL_TABLET | ORAL | 0 refills | Status: DC | PRN
Start: 2023-04-30 — End: 2023-05-30

## 2023-04-30 NOTE — H&P (View-Only) (Signed)
 MRN : [id number]  Yvonne [name] is a 67 y.o. (1955-09-24) female who presents with chief complaint of  Chief Complaint  Patient presents with   Follow-up    4-6 week follow up  .  History of Present Illness: Patient returns today in follow up of severe peripheral arterial disease with rest pain and gangrenous changes to the right foot.  She has had multiple previous revascularization procedures but has severe small vessel disease and poor tibial targets that would be unlikely to support a bypass.  We have tried conservative measures and pain control but her pain is now intolerable and she is ready to proceed with a below-knee amputation.  No fevers or chills.  No signs of systemic infection.  Current Outpatient Medications  Medication Sig Dispense Refill   amLODipine (NORVASC) 10 MG tablet Take 1 tablet by mouth once daily 30 tablet 0   aspirin EC 81 MG tablet Take 81 mg by mouth daily. Swallow whole.     atorvastatin (LIPITOR) 20 MG tablet Take 20 mg by mouth daily.     atorvastatin (LIPITOR) 20 MG tablet Take 1 tablet (20 mg total) by mouth daily. 30 tablet 11   cholecalciferol (VITAMIN D3) 25 MCG (1000 UNIT) tablet Take 1,000 Units by mouth daily.     metoprolol tartrate (LOPRESSOR) 25 MG tablet Take 1 tablet (25 mg total) by mouth 2 (two) times daily. 60 tablet 3   oxyCODONE (OXY IR/ROXICODONE) 5 MG immediate release tablet Take 1 tablet (5 mg total) by mouth every 8 (eight) hours as needed for severe pain (pain score 7-10). 30 tablet 0   rivaroxaban (XARELTO) 20 MG TABS tablet Take 1 tablet (20 mg total) by mouth daily. 30 tablet 3   No current facility-administered medications for this visit.    Past Medical History:  Diagnosis Date   Hyperlipidemia    Hypertension    Peripheral vascular disease (HCC)    Syncope     Past Surgical History:  Procedure Laterality Date   LOWER EXTREMITY ANGIOGRAPHY Right 10/02/2021   Procedure: Lower Extremity Angiography;  Surgeon: Annice Needy, MD;  Location: [facility];  Service: Cardiovascular;  Laterality: Right;   LOWER EXTREMITY ANGIOGRAPHY Left 10/09/2021   Procedure: Lower Extremity Angiography;  Surgeon: Annice Needy, MD;  Location: [facility];  Service: Cardiovascular;  Laterality: Left;   LOWER EXTREMITY ANGIOGRAPHY Right 10/27/2021   Procedure: Lower Extremity Angiography;  Surgeon: Annice Needy, MD;  Location: [facility];  Service: Cardiovascular;  Laterality: Right;   LOWER EXTREMITY ANGIOGRAPHY Right 02/15/2022   Procedure: Lower Extremity Angiography;  Surgeon: Annice Needy, MD;  Location: [facility];  Service: Cardiovascular;  Laterality: Right;   LOWER EXTREMITY ANGIOGRAPHY Right 02/28/2022   Procedure: Lower Extremity Angiography;  Surgeon: Annice Needy, MD;  Location: [facility];  Service: Cardiovascular;  Laterality: Right;   LOWER EXTREMITY ANGIOGRAPHY Right 02/18/2023   Procedure: Lower Extremity Angiography;  Surgeon: Annice Needy, MD;  Location: [facility];  Service: Cardiovascular;  Laterality: Right;   LOWER EXTREMITY ANGIOGRAPHY Right 02/19/2023   Procedure: Lower Extremity Angiography;  Surgeon: Annice Needy, MD;  Location: [facility];  Service: Cardiovascular;  Laterality: Right;   LOWER EXTREMITY INTERVENTION Right 10/27/2021   Procedure: LOWER EXTREMITY INTERVENTION;  Surgeon: Annice Needy, MD;  Location: [facility];  Service: Cardiovascular;  Laterality: Right;   LOWER EXTREMITY INTERVENTION  Right 02/15/2022   Procedure: LOWER EXTREMITY INTERVENTION;  Surgeon: Annice Needy, MD;  Location: [facility];  Service: Cardiovascular;  Laterality: Right;   PARTIAL HYSTERECTOMY     PERIPHERAL VASCULAR THROMBECTOMY Right 03/01/2022   Procedure: PERIPHERAL VASCULAR THROMBECTOMY;  Surgeon: Annice Needy, MD;  Location: [facility];  Service: Cardiovascular;  Laterality: Right;     Social History   Tobacco Use    Smoking status: Former    Current packs/day: 0.00    Types: Cigarettes    Quit date: 10/27/2021    Years since quitting: 1.5   Smokeless tobacco: Never  Vaping Use   Vaping status: Never Used  Substance Use Topics   Drug use: Never      No family history on file.   No Known Allergies   REVIEW OF SYSTEMS (Negative unless checked)   Constitutional: [] Weight loss  [] Fever  [] Chills Cardiac: [] Chest pain   [] Chest pressure   [] Palpitations   [] Shortness of breath when laying flat   [] Shortness of breath at rest   [] Shortness of breath with exertion. Vascular:  [x] Pain in legs with walking   [x] Pain in legs at rest   [] Pain in legs when laying flat   [] Claudication   [] Pain in feet when walking  [x] Pain in feet at rest  [] Pain in feet when laying flat   [] History of DVT   [] Phlebitis   [] Swelling in legs   [] Varicose veins   [x] Non-healing ulcers Pulmonary:   [] Uses home oxygen   [] Productive cough   [] Hemoptysis   [] Wheeze  [] COPD   [] Asthma Neurologic:  [] Dizziness  [] Blackouts   [] Seizures   [] History of stroke   [] History of TIA  [] Aphasia   [] Temporary blindness   [] Dysphagia   [] Weakness or numbness in arms   [] Weakness or numbness in legs Musculoskeletal:  [] Arthritis   [] Joint swelling   [] Joint pain   [] Low back pain Hematologic:  [] Easy bruising  [] Easy bleeding   [] Hypercoagulable state   [] Anemic   Gastrointestinal:  [] Blood in stool   [] Vomiting blood  [] Gastroesophageal reflux/heartburn   [] Abdominal pain Genitourinary:  [] Chronic kidney disease   [] Difficult urination  [] Frequent urination  [] Burning with urination   [] Hematuria Skin:  [] Rashes   [x] Ulcers   [x] Wounds Psychological:  [] History of anxiety   []  History of major depression.  Physical Examination  BP 134/74 (BP Location: Right Arm)   Pulse 66   Resp 15   Wt 120 lb (54.4 kg)   BMI 21.26 kg/m  Gen:   NAD, thin and frail Head: [place]/AT, No temporalis wasting. Ear/Nose/Throat: Hearing grossly intact, nares  w/o erythema or drainage Eyes: Conjunctiva clear. Sclera non-icteric Neck: Supple.  Trachea midline Pulmonary:  Good air movement, no use of accessory muscles.  Cardiac: RRR, no JVD Vascular:  Vessel Right Left  Radial Palpable Palpable                          PT Not Palpable 1+ Palpable  DP Not Palpable 1+ Palpable   Gastrointestinal: soft, non-tender/non-distended. No guarding/reflex.  Musculoskeletal: M/S 5/5 throughout.  No deformity or atrophy.  Gangrenous changes on toes on the right foot.  No significant lower extremity edema. Neurologic: Sensation grossly intact in extremities.  Symmetrical.  Speech is fluent.  Psychiatric: Judgment intact, Mood & affect appropriate for pt's clinical situation. Dermatologic: Dry gangrenous changes on the toes on the right foot.  Labs Recent Results (from the past 2160 hour(s))  BUN     Status: None   Collection Time: 02/18/23 12:31 PM  Result Value Ref Range   BUN 11 8 - 23 mg/dL    Comment: Performed at [facility], [address] [place], [place] [address]  Creatinine, serum     Status: None   Collection Time: 02/18/23 12:31 PM  Result Value Ref Range   Creatinine, Ser 0.54 0.44 - 1.00 mg/dL   GFR, Estimated >28 >41 mL/min    Comment: (NOTE) Calculated using the CKD-EPI Creatinine Equation (2021) Performed at [facility], [address] [place], [place] [address]   Glucose, capillary     Status: Abnormal   Collection Time: 02/18/23  5:34 PM  Result Value Ref Range   Glucose-Capillary 106 (H) 70 - 99 mg/dL    Comment: Glucose reference range applies only to samples taken after fasting for at least 8 hours.  Heparin level (unfractionated) every 6 hours x 4 post-procedure     Status: Abnormal   Collection Time: 02/18/23  5:53 PM  Result Value Ref Range   Heparin Unfractionated 0.23 (L) 0.30 - 0.70 IU/mL    Comment: (NOTE) The clinical reportable range upper limit is being lowered to >1.10 to  align with the FDA approved guidance for the current laboratory assay.  If heparin results are below expected values, and patient dosage has  been confirmed, suggest follow up testing of antithrombin III levels. Performed at [facility], [address] [place], [place] [address]   CBC every 6 hours x 4 post-procedure     Status: Abnormal   Collection Time: 02/18/23  5:53 PM  Result Value Ref Range   WBC 7.2 4.0 - 10.5 K/uL   RBC 3.83 (L) 3.87 - 5.11 MIL/uL   Hemoglobin 12.2 12.0 - 15.0 g/dL   HCT 53.6 64.4 - 03.4 %   MCV 97.4 80.0 - 100.0 fL   MCH 31.9 26.0 - 34.0 pg   MCHC 32.7 30.0 - 36.0 g/dL   RDW 74.2 59.5 - 63.8 %   Platelets 265 150 - 400 K/uL   nRBC 0.0 0.0 - 0.2 %    Comment: Performed at [facility], [address] [place], [place] [address]  Fibrinogen every 6 hours x 4 post-procedure     Status: None   Collection Time: 02/18/23  5:53 PM  Result Value Ref Range   Fibrinogen 305 210 - 475 mg/dL    Comment: (NOTE) Fibrinogen results may be underestimated in patients receiving thrombolytic therapy. Performed at [facility], [address] [place], [place] [address]   Heparin level (unfractionated) every 6 hours x 4 post-procedure     Status: Abnormal   Collection Time: 02/18/23 11:03 PM  Result Value Ref Range   Heparin Unfractionated <0.10 (L) 0.30 - 0.70 IU/mL    Comment: Performed at [facility], [address] [place], [place] [address]  CBC every 6 hours x 4 post-procedure     Status: Abnormal   Collection Time: 02/18/23 11:03 PM  Result Value Ref Range   WBC 9.5 4.0 - 10.5 K/uL   RBC 3.83 (L) 3.87 - 5.11 MIL/uL   Hemoglobin 12.2 12.0 - 15.0 g/dL   HCT 60.6 30.1 - 60.1 %   MCV 98.2 80.0 - 100.0 fL   MCH 31.9 26.0 - 34.0 pg   MCHC 32.4 30.0 - 36.0 g/dL   RDW 09.3 23.5 - 57.3 %  Platelets 220 150 - 400 K/uL   nRBC 0.0 0.0 - 0.2 %    Comment: Performed at [facility], [address]  [place], [place] [address]  Fibrinogen every 6 hours x 4 post-procedure     Status: Abnormal   Collection Time: 02/18/23 11:03 PM  Result Value Ref Range   Fibrinogen 124 (L) 210 - 475 mg/dL    Comment: (NOTE) Fibrinogen results may be underestimated in patients receiving thrombolytic therapy. Performed at [facility], [address] [place], [place] [address]   Heparin level (unfractionated) every 6 hours x 4 post-procedure     Status: Abnormal   Collection Time: 02/19/23  4:10 AM  Result Value Ref Range   Heparin Unfractionated <0.10 (L) 0.30 - 0.70 IU/mL    Comment: REPEATED TO VERIFY (NOTE) The clinical reportable range upper limit is being lowered to >1.10 to align with the FDA approved guidance for the current laboratory assay.  If heparin results are below expected values, and patient dosage has  been confirmed, suggest follow up testing of antithrombin III levels. Performed at [facility], [address] [place], [place] [address]   CBC every 6 hours x 4 post-procedure     Status: Abnormal   Collection Time: 02/19/23  4:10 AM  Result Value Ref Range   WBC 8.5 4.0 - 10.5 K/uL   RBC 3.67 (L) 3.87 - 5.11 MIL/uL   Hemoglobin 12.1 12.0 - 15.0 g/dL   HCT 47.4 (L) 25.9 - 56.3 %   MCV 96.7 80.0 - 100.0 fL   MCH 33.0 26.0 - 34.0 pg   MCHC 34.1 30.0 - 36.0 g/dL   RDW 87.5 64.3 - 32.9 %   Platelets 207 150 - 400 K/uL   nRBC 0.0 0.0 - 0.2 %    Comment: Performed at [facility], [address] [place], [place] [address]  Fibrinogen every 6 hours x 4 post-procedure     Status: Abnormal   Collection Time: 02/19/23  4:10 AM  Result Value Ref Range   Fibrinogen 119 (L) 210 - 475 mg/dL    Comment: (NOTE) Fibrinogen results may be underestimated in patients receiving thrombolytic therapy. Performed at [facility], [address] [place], [place] [address]   Lipid panel     Status: None   Collection Time: 02/19/23  4:10 AM  Result  Value Ref Range   Cholesterol 144 0 - 200 mg/dL   Triglycerides 80 <301 mg/dL   HDL 65 >60 mg/dL   Total CHOL/HDL Ratio 2.2 RATIO   VLDL 16 0 - 40 mg/dL   LDL Cholesterol 63 0 - 99 mg/dL    Comment:        Total Cholesterol/HDL:CHD Risk Coronary Heart Disease Risk Table                     Men   Women  1/2 Average Risk   3.4   3.3  Average Risk       5.0   4.4  2 X Average Risk   9.6   7.1  3 X Average Risk  23.4   11.0        Use the calculated Patient Ratio above and the CHD Risk Table to determine the patient's CHD Risk.        ATP III CLASSIFICATION (LDL):  <100     mg/dL   Optimal  109-323  mg/dL   Near or Above  Optimal  130-159  mg/dL   Borderline  295-284  mg/dL   High  >132     mg/dL   Very High Performed at [facility], [address] [place], [place] [address]   APTT     Status: None   Collection Time: 02/20/23  9:04 AM  Result Value Ref Range   aPTT 26 24 - 36 seconds    Comment: Performed at [facility], [address] [place], [place] [address]  Heparin level (unfractionated)     Status: Abnormal   Collection Time: 02/20/23  9:04 AM  Result Value Ref Range   Heparin Unfractionated <0.10 (L) 0.30 - 0.70 IU/mL    Comment: (NOTE) The clinical reportable range upper limit is being lowered to >1.10 to align with the FDA approved guidance for the current laboratory assay.  If heparin results are below expected values, and patient dosage has  been confirmed, suggest follow up testing of antithrombin III levels. Performed at [facility], [address] [place], [place] [address]   CBC     Status: Abnormal   Collection Time: 02/20/23  9:04 AM  Result Value Ref Range   WBC 10.8 (H) 4.0 - 10.5 K/uL   RBC 3.36 (L) 3.87 - 5.11 MIL/uL   Hemoglobin 10.9 (L) 12.0 - 15.0 g/dL   HCT 34.7 (L) 42.5 - 95.6 %   MCV 94.9 80.0 - 100.0 fL   MCH 32.4 26.0 - 34.0 pg   MCHC 34.2 30.0 - 36.0 g/dL   RDW 38.7 56.4 - 33.2 %    Platelets 163 150 - 400 K/uL   nRBC 0.0 0.0 - 0.2 %    Comment: Performed at [facility], [address] [place], [place] [address]  Heparin level (unfractionated)     Status: Abnormal   Collection Time: 02/20/23  5:11 PM  Result Value Ref Range   Heparin Unfractionated 0.27 (L) 0.30 - 0.70 IU/mL    Comment: (NOTE) The clinical reportable range upper limit is being lowered to >1.10 to align with the FDA approved guidance for the current laboratory assay.  If heparin results are below expected values, and patient dosage has  been confirmed, suggest follow up testing of antithrombin III levels. Performed at [facility], [address] [place], [place] [address]   CBC     Status: Abnormal   Collection Time: 02/21/23 12:47 AM  Result Value Ref Range   WBC 14.9 (H) 4.0 - 10.5 K/uL   RBC 3.47 (L) 3.87 - 5.11 MIL/uL   Hemoglobin 11.1 (L) 12.0 - 15.0 g/dL   HCT 63.0 (L) 16.0 - 10.9 %   MCV 97.4 80.0 - 100.0 fL   MCH 32.0 26.0 - 34.0 pg   MCHC 32.8 30.0 - 36.0 g/dL   RDW 32.3 55.7 - 32.2 %   Platelets 162 150 - 400 K/uL   nRBC 0.0 0.0 - 0.2 %    Comment: Performed at [facility], [address] [place], [place] [address]  Heparin level (unfractionated)     Status: Abnormal   Collection Time: 02/21/23 12:47 AM  Result Value Ref Range   Heparin Unfractionated 0.27 (L) 0.30 - 0.70 IU/mL    Comment: (NOTE) The clinical reportable range upper limit is being lowered to >1.10 to align with the FDA approved guidance for the current laboratory assay.  If heparin results are below expected values, and patient dosage has  been confirmed, suggest follow up testing of  antithrombin III levels. Performed at [facility], [address] [place], [place] [address]   Heparin level (unfractionated)     Status: Abnormal   Collection Time: 02/21/23  7:54 AM  Result Value Ref Range   Heparin Unfractionated <0.10 (L) 0.30 - 0.70 IU/mL    Comment:  (NOTE) The clinical reportable range upper limit is being lowered to >1.10 to align with the FDA approved guidance for the current laboratory assay.  If heparin results are below expected values, and patient dosage has  been confirmed, suggest follow up testing of antithrombin III levels. Performed at [facility], [address] [place], [place] [address]     Radiology No results found.  Assessment/Plan  Atherosclerosis of native arteries of the extremities with gangrene [place]) The patient has not unreconstructable vascular disease of the right lower extremity status post multiple previous revascularizations.  She has extremely limited and small distal targets with severe small vessel disease that would be unlikely to support a bypass.  As such, at this point her only amputation or continued pain control versus a below-knee amputation.  She is ready to proceed with the below-knee amputation.  She has tolerated this as long as she cannot and at this point her pain is intractable.  We will send her in another prescription for pain medications today and schedule her for a right below-knee amputation in the near future.  Benign essential HTN blood pressure control important in reducing the progression of atherosclerotic disease. On appropriate oral medications.     Hyperlipidemia, mixed lipid control important in reducing the progression of atherosclerotic disease. Continue statin therapy  [name], MD  04/30/2023 4:27 PM    This note was created with Dragon medical transcription system.  Any errors from dictation are purely unintentional

## 2023-04-30 NOTE — Progress Notes (Signed)
MRN : [id number]  Yvonne [name] is a 67 y.o. (1955-09-24) female who presents with chief complaint of  Chief Complaint  Patient presents with   Follow-up    4-6 week follow up  .  History of Present Illness: Patient returns today in follow up of severe peripheral arterial disease with rest pain and gangrenous changes to the right foot.  She has had multiple previous revascularization procedures but has severe small vessel disease and poor tibial targets that would be unlikely to support a bypass.  We have tried conservative measures and pain control but her pain is now intolerable and she is ready to proceed with a below-knee amputation.  No fevers or chills.  No signs of systemic infection.  Current Outpatient Medications  Medication Sig Dispense Refill   amLODipine (NORVASC) 10 MG tablet Take 1 tablet by mouth once daily 30 tablet 0   aspirin EC 81 MG tablet Take 81 mg by mouth daily. Swallow whole.     atorvastatin (LIPITOR) 20 MG tablet Take 20 mg by mouth daily.     atorvastatin (LIPITOR) 20 MG tablet Take 1 tablet (20 mg total) by mouth daily. 30 tablet 11   cholecalciferol (VITAMIN D3) 25 MCG (1000 UNIT) tablet Take 1,000 Units by mouth daily.     metoprolol tartrate (LOPRESSOR) 25 MG tablet Take 1 tablet (25 mg total) by mouth 2 (two) times daily. 60 tablet 3   oxyCODONE (OXY IR/ROXICODONE) 5 MG immediate release tablet Take 1 tablet (5 mg total) by mouth every 8 (eight) hours as needed for severe pain (pain score 7-10). 30 tablet 0   rivaroxaban (XARELTO) 20 MG TABS tablet Take 1 tablet (20 mg total) by mouth daily. 30 tablet 3   No current facility-administered medications for this visit.    Past Medical History:  Diagnosis Date   Hyperlipidemia    Hypertension    Peripheral vascular disease (HCC)    Syncope     Past Surgical History:  Procedure Laterality Date   LOWER EXTREMITY ANGIOGRAPHY Right 10/02/2021   Procedure: Lower Extremity Angiography;  Surgeon: Annice Needy, MD;  Location: [facility];  Service: Cardiovascular;  Laterality: Right;   LOWER EXTREMITY ANGIOGRAPHY Left 10/09/2021   Procedure: Lower Extremity Angiography;  Surgeon: Annice Needy, MD;  Location: [facility];  Service: Cardiovascular;  Laterality: Left;   LOWER EXTREMITY ANGIOGRAPHY Right 10/27/2021   Procedure: Lower Extremity Angiography;  Surgeon: Annice Needy, MD;  Location: [facility];  Service: Cardiovascular;  Laterality: Right;   LOWER EXTREMITY ANGIOGRAPHY Right 02/15/2022   Procedure: Lower Extremity Angiography;  Surgeon: Annice Needy, MD;  Location: [facility];  Service: Cardiovascular;  Laterality: Right;   LOWER EXTREMITY ANGIOGRAPHY Right 02/28/2022   Procedure: Lower Extremity Angiography;  Surgeon: Annice Needy, MD;  Location: [facility];  Service: Cardiovascular;  Laterality: Right;   LOWER EXTREMITY ANGIOGRAPHY Right 02/18/2023   Procedure: Lower Extremity Angiography;  Surgeon: Annice Needy, MD;  Location: [facility];  Service: Cardiovascular;  Laterality: Right;   LOWER EXTREMITY ANGIOGRAPHY Right 02/19/2023   Procedure: Lower Extremity Angiography;  Surgeon: Annice Needy, MD;  Location: [facility];  Service: Cardiovascular;  Laterality: Right;   LOWER EXTREMITY INTERVENTION Right 10/27/2021   Procedure: LOWER EXTREMITY INTERVENTION;  Surgeon: Annice Needy, MD;  Location: [facility];  Service: Cardiovascular;  Laterality: Right;   LOWER EXTREMITY INTERVENTION  Right 02/15/2022   Procedure: LOWER EXTREMITY INTERVENTION;  Surgeon: Annice Needy, MD;  Location: [facility];  Service: Cardiovascular;  Laterality: Right;   PARTIAL HYSTERECTOMY     PERIPHERAL VASCULAR THROMBECTOMY Right 03/01/2022   Procedure: PERIPHERAL VASCULAR THROMBECTOMY;  Surgeon: Annice Needy, MD;  Location: [facility];  Service: Cardiovascular;  Laterality: Right;     Social History   Tobacco Use    Smoking status: Former    Current packs/day: 0.00    Types: Cigarettes    Quit date: 10/27/2021    Years since quitting: 1.5   Smokeless tobacco: Never  Vaping Use   Vaping status: Never Used  Substance Use Topics   Drug use: Never      No family history on file.   No Known Allergies   REVIEW OF SYSTEMS (Negative unless checked)   Constitutional: [] Weight loss  [] Fever  [] Chills Cardiac: [] Chest pain   [] Chest pressure   [] Palpitations   [] Shortness of breath when laying flat   [] Shortness of breath at rest   [] Shortness of breath with exertion. Vascular:  [x] Pain in legs with walking   [x] Pain in legs at rest   [] Pain in legs when laying flat   [] Claudication   [] Pain in feet when walking  [x] Pain in feet at rest  [] Pain in feet when laying flat   [] History of DVT   [] Phlebitis   [] Swelling in legs   [] Varicose veins   [x] Non-healing ulcers Pulmonary:   [] Uses home oxygen   [] Productive cough   [] Hemoptysis   [] Wheeze  [] COPD   [] Asthma Neurologic:  [] Dizziness  [] Blackouts   [] Seizures   [] History of stroke   [] History of TIA  [] Aphasia   [] Temporary blindness   [] Dysphagia   [] Weakness or numbness in arms   [] Weakness or numbness in legs Musculoskeletal:  [] Arthritis   [] Joint swelling   [] Joint pain   [] Low back pain Hematologic:  [] Easy bruising  [] Easy bleeding   [] Hypercoagulable state   [] Anemic   Gastrointestinal:  [] Blood in stool   [] Vomiting blood  [] Gastroesophageal reflux/heartburn   [] Abdominal pain Genitourinary:  [] Chronic kidney disease   [] Difficult urination  [] Frequent urination  [] Burning with urination   [] Hematuria Skin:  [] Rashes   [x] Ulcers   [x] Wounds Psychological:  [] History of anxiety   []  History of major depression.  Physical Examination  BP 134/74 (BP Location: Right Arm)   Pulse 66   Resp 15   Wt 120 lb (54.4 kg)   BMI 21.26 kg/m  Gen:   NAD, thin and frail Head: [place]/AT, No temporalis wasting. Ear/Nose/Throat: Hearing grossly intact, nares  w/o erythema or drainage Eyes: Conjunctiva clear. Sclera non-icteric Neck: Supple.  Trachea midline Pulmonary:  Good air movement, no use of accessory muscles.  Cardiac: RRR, no JVD Vascular:  Vessel Right Left  Radial Palpable Palpable                          PT Not Palpable 1+ Palpable  DP Not Palpable 1+ Palpable   Gastrointestinal: soft, non-tender/non-distended. No guarding/reflex.  Musculoskeletal: M/S 5/5 throughout.  No deformity or atrophy.  Gangrenous changes on toes on the right foot.  No significant lower extremity edema. Neurologic: Sensation grossly intact in extremities.  Symmetrical.  Speech is fluent.  Psychiatric: Judgment intact, Mood & affect appropriate for pt's clinical situation. Dermatologic: Dry gangrenous changes on the toes on the right foot.  Labs Recent Results (from the past 2160 hour(s))  BUN     Status: None   Collection Time: 02/18/23 12:31 PM  Result Value Ref Range   BUN 11 8 - 23 mg/dL    Comment: Performed at [facility], [address] [place], [place] [address]  Creatinine, serum     Status: None   Collection Time: 02/18/23 12:31 PM  Result Value Ref Range   Creatinine, Ser 0.54 0.44 - 1.00 mg/dL   GFR, Estimated >28 >41 mL/min    Comment: (NOTE) Calculated using the CKD-EPI Creatinine Equation (2021) Performed at [facility], [address] [place], [place] [address]   Glucose, capillary     Status: Abnormal   Collection Time: 02/18/23  5:34 PM  Result Value Ref Range   Glucose-Capillary 106 (H) 70 - 99 mg/dL    Comment: Glucose reference range applies only to samples taken after fasting for at least 8 hours.  Heparin level (unfractionated) every 6 hours x 4 post-procedure     Status: Abnormal   Collection Time: 02/18/23  5:53 PM  Result Value Ref Range   Heparin Unfractionated 0.23 (L) 0.30 - 0.70 IU/mL    Comment: (NOTE) The clinical reportable range upper limit is being lowered to >1.10 to  align with the FDA approved guidance for the current laboratory assay.  If heparin results are below expected values, and patient dosage has  been confirmed, suggest follow up testing of antithrombin III levels. Performed at [facility], [address] [place], [place] [address]   CBC every 6 hours x 4 post-procedure     Status: Abnormal   Collection Time: 02/18/23  5:53 PM  Result Value Ref Range   WBC 7.2 4.0 - 10.5 K/uL   RBC 3.83 (L) 3.87 - 5.11 MIL/uL   Hemoglobin 12.2 12.0 - 15.0 g/dL   HCT 53.6 64.4 - 03.4 %   MCV 97.4 80.0 - 100.0 fL   MCH 31.9 26.0 - 34.0 pg   MCHC 32.7 30.0 - 36.0 g/dL   RDW 74.2 59.5 - 63.8 %   Platelets 265 150 - 400 K/uL   nRBC 0.0 0.0 - 0.2 %    Comment: Performed at [facility], [address] [place], [place] [address]  Fibrinogen every 6 hours x 4 post-procedure     Status: None   Collection Time: 02/18/23  5:53 PM  Result Value Ref Range   Fibrinogen 305 210 - 475 mg/dL    Comment: (NOTE) Fibrinogen results may be underestimated in patients receiving thrombolytic therapy. Performed at [facility], [address] [place], [place] [address]   Heparin level (unfractionated) every 6 hours x 4 post-procedure     Status: Abnormal   Collection Time: 02/18/23 11:03 PM  Result Value Ref Range   Heparin Unfractionated <0.10 (L) 0.30 - 0.70 IU/mL    Comment: Performed at [facility], [address] [place], [place] [address]  CBC every 6 hours x 4 post-procedure     Status: Abnormal   Collection Time: 02/18/23 11:03 PM  Result Value Ref Range   WBC 9.5 4.0 - 10.5 K/uL   RBC 3.83 (L) 3.87 - 5.11 MIL/uL   Hemoglobin 12.2 12.0 - 15.0 g/dL   HCT 60.6 30.1 - 60.1 %   MCV 98.2 80.0 - 100.0 fL   MCH 31.9 26.0 - 34.0 pg   MCHC 32.4 30.0 - 36.0 g/dL   RDW 09.3 23.5 - 57.3 %  Platelets 220 150 - 400 K/uL   nRBC 0.0 0.0 - 0.2 %    Comment: Performed at [facility], [address]  [place], [place] [address]  Fibrinogen every 6 hours x 4 post-procedure     Status: Abnormal   Collection Time: 02/18/23 11:03 PM  Result Value Ref Range   Fibrinogen 124 (L) 210 - 475 mg/dL    Comment: (NOTE) Fibrinogen results may be underestimated in patients receiving thrombolytic therapy. Performed at [facility], [address] [place], [place] [address]   Heparin level (unfractionated) every 6 hours x 4 post-procedure     Status: Abnormal   Collection Time: 02/19/23  4:10 AM  Result Value Ref Range   Heparin Unfractionated <0.10 (L) 0.30 - 0.70 IU/mL    Comment: REPEATED TO VERIFY (NOTE) The clinical reportable range upper limit is being lowered to >1.10 to align with the FDA approved guidance for the current laboratory assay.  If heparin results are below expected values, and patient dosage has  been confirmed, suggest follow up testing of antithrombin III levels. Performed at [facility], [address] [place], [place] [address]   CBC every 6 hours x 4 post-procedure     Status: Abnormal   Collection Time: 02/19/23  4:10 AM  Result Value Ref Range   WBC 8.5 4.0 - 10.5 K/uL   RBC 3.67 (L) 3.87 - 5.11 MIL/uL   Hemoglobin 12.1 12.0 - 15.0 g/dL   HCT 47.4 (L) 25.9 - 56.3 %   MCV 96.7 80.0 - 100.0 fL   MCH 33.0 26.0 - 34.0 pg   MCHC 34.1 30.0 - 36.0 g/dL   RDW 87.5 64.3 - 32.9 %   Platelets 207 150 - 400 K/uL   nRBC 0.0 0.0 - 0.2 %    Comment: Performed at [facility], [address] [place], [place] [address]  Fibrinogen every 6 hours x 4 post-procedure     Status: Abnormal   Collection Time: 02/19/23  4:10 AM  Result Value Ref Range   Fibrinogen 119 (L) 210 - 475 mg/dL    Comment: (NOTE) Fibrinogen results may be underestimated in patients receiving thrombolytic therapy. Performed at [facility], [address] [place], [place] [address]   Lipid panel     Status: None   Collection Time: 02/19/23  4:10 AM  Result  Value Ref Range   Cholesterol 144 0 - 200 mg/dL   Triglycerides 80 <301 mg/dL   HDL 65 >60 mg/dL   Total CHOL/HDL Ratio 2.2 RATIO   VLDL 16 0 - 40 mg/dL   LDL Cholesterol 63 0 - 99 mg/dL    Comment:        Total Cholesterol/HDL:CHD Risk Coronary Heart Disease Risk Table                     Men   Women  1/2 Average Risk   3.4   3.3  Average Risk       5.0   4.4  2 X Average Risk   9.6   7.1  3 X Average Risk  23.4   11.0        Use the calculated Patient Ratio above and the CHD Risk Table to determine the patient's CHD Risk.        ATP III CLASSIFICATION (LDL):  <100     mg/dL   Optimal  109-323  mg/dL   Near or Above  Optimal  130-159  mg/dL   Borderline  295-284  mg/dL   High  >132     mg/dL   Very High Performed at [facility], [address] [place], [place] [address]   APTT     Status: None   Collection Time: 02/20/23  9:04 AM  Result Value Ref Range   aPTT 26 24 - 36 seconds    Comment: Performed at [facility], [address] [place], [place] [address]  Heparin level (unfractionated)     Status: Abnormal   Collection Time: 02/20/23  9:04 AM  Result Value Ref Range   Heparin Unfractionated <0.10 (L) 0.30 - 0.70 IU/mL    Comment: (NOTE) The clinical reportable range upper limit is being lowered to >1.10 to align with the FDA approved guidance for the current laboratory assay.  If heparin results are below expected values, and patient dosage has  been confirmed, suggest follow up testing of antithrombin III levels. Performed at [facility], [address] [place], [place] [address]   CBC     Status: Abnormal   Collection Time: 02/20/23  9:04 AM  Result Value Ref Range   WBC 10.8 (H) 4.0 - 10.5 K/uL   RBC 3.36 (L) 3.87 - 5.11 MIL/uL   Hemoglobin 10.9 (L) 12.0 - 15.0 g/dL   HCT 34.7 (L) 42.5 - 95.6 %   MCV 94.9 80.0 - 100.0 fL   MCH 32.4 26.0 - 34.0 pg   MCHC 34.2 30.0 - 36.0 g/dL   RDW 38.7 56.4 - 33.2 %    Platelets 163 150 - 400 K/uL   nRBC 0.0 0.0 - 0.2 %    Comment: Performed at [facility], [address] [place], [place] [address]  Heparin level (unfractionated)     Status: Abnormal   Collection Time: 02/20/23  5:11 PM  Result Value Ref Range   Heparin Unfractionated 0.27 (L) 0.30 - 0.70 IU/mL    Comment: (NOTE) The clinical reportable range upper limit is being lowered to >1.10 to align with the FDA approved guidance for the current laboratory assay.  If heparin results are below expected values, and patient dosage has  been confirmed, suggest follow up testing of antithrombin III levels. Performed at [facility], [address] [place], [place] [address]   CBC     Status: Abnormal   Collection Time: 02/21/23 12:47 AM  Result Value Ref Range   WBC 14.9 (H) 4.0 - 10.5 K/uL   RBC 3.47 (L) 3.87 - 5.11 MIL/uL   Hemoglobin 11.1 (L) 12.0 - 15.0 g/dL   HCT 63.0 (L) 16.0 - 10.9 %   MCV 97.4 80.0 - 100.0 fL   MCH 32.0 26.0 - 34.0 pg   MCHC 32.8 30.0 - 36.0 g/dL   RDW 32.3 55.7 - 32.2 %   Platelets 162 150 - 400 K/uL   nRBC 0.0 0.0 - 0.2 %    Comment: Performed at [facility], [address] [place], [place] [address]  Heparin level (unfractionated)     Status: Abnormal   Collection Time: 02/21/23 12:47 AM  Result Value Ref Range   Heparin Unfractionated 0.27 (L) 0.30 - 0.70 IU/mL    Comment: (NOTE) The clinical reportable range upper limit is being lowered to >1.10 to align with the FDA approved guidance for the current laboratory assay.  If heparin results are below expected values, and patient dosage has  been confirmed, suggest follow up testing of  antithrombin III levels. Performed at [facility], [address] [place], [place] [address]   Heparin level (unfractionated)     Status: Abnormal   Collection Time: 02/21/23  7:54 AM  Result Value Ref Range   Heparin Unfractionated <0.10 (L) 0.30 - 0.70 IU/mL    Comment:  (NOTE) The clinical reportable range upper limit is being lowered to >1.10 to align with the FDA approved guidance for the current laboratory assay.  If heparin results are below expected values, and patient dosage has  been confirmed, suggest follow up testing of antithrombin III levels. Performed at [facility], [address] [place], [place] [address]     Radiology No results found.  Assessment/Plan  Atherosclerosis of native arteries of the extremities with gangrene [place]) The patient has not unreconstructable vascular disease of the right lower extremity status post multiple previous revascularizations.  She has extremely limited and small distal targets with severe small vessel disease that would be unlikely to support a bypass.  As such, at this point her only amputation or continued pain control versus a below-knee amputation.  She is ready to proceed with the below-knee amputation.  She has tolerated this as long as she cannot and at this point her pain is intractable.  We will send her in another prescription for pain medications today and schedule her for a right below-knee amputation in the near future.  Benign essential HTN blood pressure control important in reducing the progression of atherosclerotic disease. On appropriate oral medications.     Hyperlipidemia, mixed lipid control important in reducing the progression of atherosclerotic disease. Continue statin therapy  [name], MD  04/30/2023 4:27 PM    This note was created with Dragon medical transcription system.  Any errors from dictation are purely unintentional

## 2023-04-30 NOTE — Assessment & Plan Note (Signed)
The patient has not unreconstructable vascular disease of the right lower extremity status post multiple previous revascularizations.  She has extremely limited and small distal targets with severe small vessel disease that would be unlikely to support a bypass.  As such, at this point her only amputation or continued pain control versus a below-knee amputation.  She is ready to proceed with the below-knee amputation.  She has tolerated this as long as she cannot and at this point her pain is intractable.  We will send her in another prescription for pain medications today and schedule her for a right below-knee amputation in the near future.

## 2023-04-30 NOTE — Addendum Note (Signed)
Addended by: Annice Needy on: 04/30/2023 04:31 PM   Modules accepted: Orders

## 2023-05-02 ENCOUNTER — Telehealth (INDEPENDENT_AMBULATORY_CARE_PROVIDER_SITE_OTHER): Payer: Self-pay

## 2023-05-02 NOTE — Telephone Encounter (Signed)
Spoke with the patient on 05/01/23 and she has been scheduled for a right BKA with Dr. Wyn Quaker on 05/09/23 at the [facility]. Pre-op is on 05/07/23 at 8:00 am  at the [facility]. Pre-surgical instructions were discussed and will be mailed, patient was asked to write down the info regarding her pre-op and calling [facility] for arrival time.

## 2023-05-07 ENCOUNTER — Other Ambulatory Visit (INDEPENDENT_AMBULATORY_CARE_PROVIDER_SITE_OTHER): Payer: Self-pay | Admitting: Nurse Practitioner

## 2023-05-07 ENCOUNTER — Other Ambulatory Visit: Payer: Self-pay

## 2023-05-07 ENCOUNTER — Other Ambulatory Visit: Payer: Medicare HMO

## 2023-05-07 ENCOUNTER — Encounter
Admission: RE | Admit: 2023-05-07 | Discharge: 2023-05-07 | Disposition: A | Discharge status: Home / Self Care | Payer: Medicare HMO | Source: Ambulatory Visit | Attending: Vascular Surgery | Admitting: Vascular Surgery

## 2023-05-07 VITALS — BP 123/67 | HR 67 | Ht 63.0 in | Wt 116.4 lb

## 2023-05-07 DIAGNOSIS — I998 Other disorder of circulatory system: Secondary | ICD-10-CM

## 2023-05-07 DIAGNOSIS — Z01812 Encounter for preprocedural laboratory examination: Secondary | ICD-10-CM | POA: Insufficient documentation

## 2023-05-07 DIAGNOSIS — I10 Essential (primary) hypertension: Secondary | ICD-10-CM | POA: Insufficient documentation

## 2023-05-07 HISTORY — DX: Atherosclerosis of native arteries of extremities with gangrene, unspecified extremity: I70.269

## 2023-05-07 HISTORY — DX: Cardiac arrhythmia, unspecified: I49.9

## 2023-05-07 HISTORY — DX: Supraventricular tachycardia, unspecified: I47.10

## 2023-05-07 HISTORY — DX: Other disorder of circulatory system: I99.8

## 2023-05-07 HISTORY — DX: Occlusion and stenosis of bilateral carotid arteries: I65.23

## 2023-05-07 HISTORY — DX: Atherosclerosis of native arteries of right leg with ulceration of unspecified site: I70.239

## 2023-05-07 LAB — CBC WITH DIFFERENTIAL/PLATELET
Abs Immature Granulocytes: 0.01 10*3/uL (ref 0.00–0.07)
Basophils Absolute: 0 10*3/uL (ref 0.0–0.1)
Basophils Relative: 1 %
Eosinophils Absolute: 0.1 10*3/uL (ref 0.0–0.5)
Eosinophils Relative: 1 %
HCT: 36.9 % (ref 36.0–46.0)
Hemoglobin: 12.1 g/dL (ref 12.0–15.0)
Immature Granulocytes: 0 %
Lymphocytes Relative: 37 %
Lymphs Abs: 2.3 10*3/uL (ref 0.7–4.0)
MCH: 32.1 pg (ref 26.0–34.0)
MCHC: 32.8 g/dL (ref 30.0–36.0)
MCV: 97.9 fL (ref 80.0–100.0)
Monocytes Absolute: 0.6 10*3/uL (ref 0.1–1.0)
Monocytes Relative: 9 %
Neutro Abs: 3.2 10*3/uL (ref 1.7–7.7)
Neutrophils Relative %: 52 %
Platelets: 396 10*3/uL (ref 150–400)
RBC: 3.77 MIL/uL — ABNORMAL LOW (ref 3.87–5.11)
RDW: 14 % (ref 11.5–15.5)
WBC: 6.1 10*3/uL (ref 4.0–10.5)
nRBC: 0 % (ref 0.0–0.2)

## 2023-05-07 LAB — BASIC METABOLIC PANEL
Anion gap: 11 (ref 5–15)
BUN: 17 mg/dL (ref 8–23)
CO2: 24 mmol/L (ref 22–32)
Calcium: 9.3 mg/dL (ref 8.9–10.3)
Chloride: 104 mmol/L (ref 98–111)
Creatinine, Ser: 0.48 mg/dL (ref 0.44–1.00)
GFR, Estimated: >60 mL/min (ref >60)
Glucose, Bld: 90 mg/dL (ref 70–99)
Potassium: 3.9 mmol/L (ref 3.5–5.1)
Sodium: 139 mmol/L (ref 135–145)

## 2023-05-07 NOTE — Patient Instructions (Addendum)
Your procedure is scheduled on: 05/09/23 - Thursday Report to the Registration Desk on the 1st floor of the [facility]. To find out your arrival time, please call [phone] between 1PM - 3PM on: 05/08/23 - Wednesday If your arrival time is 6:00 am, do not arrive before that time as the [facility] entrance doors do not open until 6:00 am.  REMEMBER: Instructions that are not followed completely may result in serious medical risk, up to and including death; or upon the discretion of your surgeon and anesthesiologist your surgery may need to be rescheduled.  Do not eat food or drink any liquids after midnight the night before surgery.  No gum chewing or hard candies.   One week prior to surgery: Stop Anti-inflammatories (NSAIDS) such as Advil, Aleve, Ibuprofen, Motrin, Naproxen, Naprosyn and Aspirin based products such as Excedrin, Goody's Powder, BC Powder. You may take Tylenol if needed for pain up until the day of surgery.  Stop ANY OVER THE COUNTER supplements until after surgery : VITAMIN D3   HOLD rivaroxaban (XARELTO) beginning 05/07/23, resume with doctor orders after your procedure.   ON THE DAY OF SURGERY ONLY TAKE THESE MEDICATIONS WITH SIPS OF WATER:  amLODipine (NORVASC)  atorvastatin (LIPITOR)  metoprolol tartrate (LOPRESSOR)  Percocet if needed   No Alcohol for 24 hours before or after surgery.  No Smoking including e-cigarettes for 24 hours before surgery.  No chewable tobacco products for at least 6 hours before surgery.  No nicotine patches on the day of surgery.  Do not use any "recreational" drugs for at least a week (preferably 2 weeks) before your surgery.  Please be advised that the combination of cocaine and anesthesia may have negative outcomes, up to and including death. If you test positive for cocaine, your surgery will be cancelled.  On the morning of surgery brush your teeth with toothpaste and water, you may rinse your mouth with mouthwash if  you wish. Do not swallow any toothpaste or mouthwash.  Use CHG Soap or wipes as directed on instruction sheet.  Do not wear jewelry, make-up, hairpins, clips or nail polish.  For welded (permanent) jewelry: bracelets, anklets, waist bands, etc.  Please have this removed prior to surgery.  If it is not removed, there is a chance that hospital personnel will need to cut it off on the day of surgery.  Do not wear lotions, powders, or perfumes.   Do not shave body hair from the neck down 48 hours before surgery.  Contact lenses, hearing aids and dentures may not be worn into surgery.  Do not bring valuables to the hospital. [facility] is not responsible for any missing/lost belongings or valuables.   Notify your doctor if there is any change in your medical condition (cold, fever, infection).  Wear comfortable clothing (specific to your surgery type) to the hospital.  After surgery, you can help prevent lung complications by doing breathing exercises.  Take deep breaths and cough every 1-2 hours. Your doctor may order a device called an Incentive Spirometer to help you take deep breaths. When coughing or sneezing, hold a pillow firmly against your incision with both hands. This is called "splinting." Doing this helps protect your incision. It also decreases belly discomfort.  If you are being admitted to the hospital overnight, leave your suitcase in the car. After surgery it may be brought to your room.  In case of increased patient census, it may be necessary for you, the patient, to continue your  postoperative care in the Same Day Surgery department.  If you are being discharged the day of surgery, you will not be allowed to drive home. You will need a responsible individual to drive you home and stay with you for 24 hours after surgery.   If you are taking public transportation, you will need to have a responsible individual with you.  Please call the Pre-admissions Testing Dept.  at [phone] if you have any questions about these instructions.  Surgery Visitation Policy:  Patients having surgery or a procedure may have two visitors.  Children under the age of 92 must have an adult with them who is not the patient.  Inpatient Visitation:    Visiting hours are 7 a.m. to 8 p.m. Up to four visitors are allowed at one time in a patient room. The visitors may rotate out with other people during the day.  One visitor age 70 or older may stay with the patient overnight and must be in the room by 8 p.m.   Preparing for Surgery with CHLORHEXIDINE GLUCONATE (CHG) Soap  Chlorhexidine Gluconate (CHG) Soap  o An antiseptic cleaner that kills germs and bonds with the skin to continue killing germs even after washing  o Used for showering the night before surgery and morning of surgery  Before surgery, you can play an important role by reducing the number of germs on your skin.  CHG (Chlorhexidine gluconate) soap is an antiseptic cleanser which kills germs and bonds with the skin to continue killing germs even after washing.  Please do not use if you have an allergy to CHG or antibacterial soaps. If your skin becomes reddened/irritated stop using the CHG.  1. Shower the NIGHT BEFORE SURGERY and the MORNING OF SURGERY with CHG soap.  2. If you choose to wash your hair, wash your hair first as usual with your normal shampoo.  3. After shampooing, rinse your hair and body thoroughly to remove the shampoo.  4. Use CHG as you would any other liquid soap. You can apply CHG directly to the skin and wash gently with a scrungie or a clean washcloth.  5. Apply the CHG soap to your body only from the neck down. Do not use on open wounds or open sores. Avoid contact with your eyes, ears, mouth, and genitals (private parts). Wash face and genitals (private parts) with your normal soap.  6. Wash thoroughly, paying special attention to the area where your surgery will be  performed.  7. Thoroughly rinse your body with warm water.  8. Do not shower/wash with your normal soap after using and rinsing off the CHG soap.  9. Pat yourself dry with a clean towel.  10. Wear clean pajamas to bed the night before surgery.  12. Place clean sheets on your bed the night of your first shower and do not sleep with pets.  13. Shower again with the CHG soap on the day of surgery prior to arriving at the hospital.  14. Do not apply any deodorants/lotions/powders.  15. Please wear clean clothes to the hospital.

## 2023-05-08 LAB — TYPE AND SCREEN
ABO/RH(D): B POS
Antibody Screen: NEGATIVE

## 2023-05-09 ENCOUNTER — Other Ambulatory Visit: Payer: Self-pay

## 2023-05-09 ENCOUNTER — Inpatient Hospital Stay: Payer: Self-pay | Admitting: Urgent Care

## 2023-05-09 ENCOUNTER — Other Ambulatory Visit: Payer: Medicare HMO

## 2023-05-09 ENCOUNTER — Encounter: Payer: Self-pay | Admitting: Vascular Surgery

## 2023-05-09 ENCOUNTER — Inpatient Hospital Stay
Admission: RE | Admit: 2023-05-09 | Discharge: 2023-05-14 | DRG: 240 | Disposition: A | Discharge status: Home Health | Payer: Medicare HMO | Attending: Vascular Surgery | Admitting: Vascular Surgery

## 2023-05-09 ENCOUNTER — Encounter
Admission: RE | Disposition: A | Discharge status: Home / Self Care | Payer: Self-pay | Source: Ambulatory Visit | Attending: Vascular Surgery

## 2023-05-09 DIAGNOSIS — I10 Essential (primary) hypertension: Secondary | ICD-10-CM | POA: Diagnosis present

## 2023-05-09 DIAGNOSIS — Z7901 Long term (current) use of anticoagulants: Secondary | ICD-10-CM

## 2023-05-09 DIAGNOSIS — G546 Phantom limb syndrome with pain: Secondary | ICD-10-CM | POA: Diagnosis present

## 2023-05-09 DIAGNOSIS — Z89511 Acquired absence of right leg below knee: Secondary | ICD-10-CM | POA: Diagnosis not present

## 2023-05-09 DIAGNOSIS — I998 Other disorder of circulatory system: Secondary | ICD-10-CM | POA: Diagnosis not present

## 2023-05-09 DIAGNOSIS — I96 Gangrene, not elsewhere classified: Secondary | ICD-10-CM

## 2023-05-09 DIAGNOSIS — I70261 Atherosclerosis of native arteries of extremities with gangrene, right leg: Secondary | ICD-10-CM | POA: Diagnosis not present

## 2023-05-09 DIAGNOSIS — Z136 Encounter for screening for cardiovascular disorders: Secondary | ICD-10-CM | POA: Diagnosis not present

## 2023-05-09 DIAGNOSIS — Z90711 Acquired absence of uterus with remaining cervical stump: Secondary | ICD-10-CM | POA: Diagnosis not present

## 2023-05-09 DIAGNOSIS — Z79899 Other long term (current) drug therapy: Secondary | ICD-10-CM

## 2023-05-09 DIAGNOSIS — Z7982 Long term (current) use of aspirin: Secondary | ICD-10-CM

## 2023-05-09 DIAGNOSIS — E782 Mixed hyperlipidemia: Secondary | ICD-10-CM | POA: Diagnosis present

## 2023-05-09 DIAGNOSIS — Z87891 Personal history of nicotine dependence: Secondary | ICD-10-CM | POA: Diagnosis not present

## 2023-05-09 DIAGNOSIS — I70269 Atherosclerosis of native arteries of extremities with gangrene, unspecified extremity: Secondary | ICD-10-CM | POA: Diagnosis not present

## 2023-05-09 DIAGNOSIS — I2510 Atherosclerotic heart disease of native coronary artery without angina pectoris: Secondary | ICD-10-CM | POA: Diagnosis not present

## 2023-05-09 DIAGNOSIS — M86171 Other acute osteomyelitis, right ankle and foot: Secondary | ICD-10-CM | POA: Diagnosis present

## 2023-05-09 HISTORY — PX: AMPUTATION: SHX166

## 2023-05-09 LAB — CBC
HCT: 35.4 % — ABNORMAL LOW (ref 36.0–46.0)
Hemoglobin: 11.3 g/dL — ABNORMAL LOW (ref 12.0–15.0)
MCH: 31.3 pg (ref 26.0–34.0)
MCHC: 31.9 g/dL (ref 30.0–36.0)
MCV: 98.1 fL (ref 80.0–100.0)
Platelets: 374 10*3/uL (ref 150–400)
RBC: 3.61 MIL/uL — ABNORMAL LOW (ref 3.87–5.11)
RDW: 13.8 % (ref 11.5–15.5)
WBC: 6.8 10*3/uL (ref 4.0–10.5)
nRBC: 0 % (ref 0.0–0.2)

## 2023-05-09 LAB — CREATININE, SERUM
Creatinine, Ser: 0.46 mg/dL (ref 0.44–1.00)
GFR, Estimated: >60 mL/min (ref >60)

## 2023-05-09 LAB — ABO/RH: ABO/RH(D): B POS

## 2023-05-09 SURGERY — AMPUTATION BELOW KNEE
Anesthesia: General | Site: Knee | Laterality: Right

## 2023-05-09 MED ORDER — FENTANYL CITRATE (PF) 100 MCG/2ML IJ SOLN
25.0000 ug | INTRAMUSCULAR | Status: DC | PRN
  Administered 2023-05-09 (×2): 25 ug via INTRAVENOUS

## 2023-05-09 MED ORDER — TRAMADOL HCL 50 MG PO TABS
50.0000 mg | ORAL_TABLET | Freq: Four times a day (QID) | ORAL | Status: DC
  Administered 2023-05-09 – 2023-05-14 (×20): 50 mg via ORAL
  Filled 2023-05-09 (×20): qty 1

## 2023-05-09 MED ORDER — OXYCODONE HCL 5 MG/5ML PO SOLN: 5.0000 mg | Freq: Once | ORAL | Status: AC | PRN

## 2023-05-09 MED ORDER — CHLORHEXIDINE GLUCONATE 0.12 % MT SOLN
15.0000 mL | Freq: Once | OROMUCOSAL | Status: AC
  Administered 2023-05-09: 15 mL via OROMUCOSAL

## 2023-05-09 MED ORDER — FAMOTIDINE IN NACL 20-0.9 MG/50ML-% IV SOLN
20.0000 mg | Freq: Two times a day (BID) | INTRAVENOUS | Status: DC
  Administered 2023-05-09 – 2023-05-11 (×4): 20 mg via INTRAVENOUS
  Filled 2023-05-09 (×5): qty 50

## 2023-05-09 MED ORDER — ACETAMINOPHEN 325 MG PO TABS: 325.0000 mg | ORAL_TABLET | Freq: Four times a day (QID) | ORAL | Status: DC | PRN

## 2023-05-09 MED ORDER — VITAMIN D 25 MCG (1000 UNIT) PO TABS
1000.0000 [IU] | ORAL_TABLET | Freq: Every day | ORAL | Status: DC
Start: 2023-05-10 — End: 2023-05-14
  Administered 2023-05-10 – 2023-05-13 (×4): 1000 [IU] via ORAL
  Filled 2023-05-09 (×4): qty 1

## 2023-05-09 MED ORDER — OXYCODONE-ACETAMINOPHEN 5-325 MG PO TABS: 1.0000 | ORAL_TABLET | ORAL | Status: DC | PRN

## 2023-05-09 MED ORDER — LACTATED RINGERS IV SOLN: INTRAVENOUS | Status: DC

## 2023-05-09 MED ORDER — DIPHENHYDRAMINE-APAP (SLEEP) 25-500 MG PO TABS: 1.0000 | ORAL_TABLET | Freq: Every evening | ORAL | Status: DC | PRN

## 2023-05-09 MED ORDER — FENTANYL CITRATE (PF) 100 MCG/2ML IJ SOLN
INTRAMUSCULAR | Status: DC | PRN
  Administered 2023-05-09 (×2): 50 ug via INTRAVENOUS

## 2023-05-09 MED ORDER — DEXAMETHASONE SODIUM PHOSPHATE 10 MG/ML IJ SOLN
INTRAMUSCULAR | Status: DC | PRN
  Administered 2023-05-09: 10 mg via INTRAVENOUS

## 2023-05-09 MED ORDER — HEPARIN SODIUM (PORCINE) 5000 UNIT/ML IJ SOLN: 5000.0000 [IU] | Freq: Three times a day (TID) | INTRAMUSCULAR | Status: DC

## 2023-05-09 MED ORDER — CEFAZOLIN SODIUM-DEXTROSE 2-4 GM/100ML-% IV SOLN
2.0000 g | INTRAVENOUS | Status: AC
  Administered 2023-05-09: 2 g via INTRAVENOUS

## 2023-05-09 MED ORDER — ONDANSETRON HCL 4 MG/2ML IJ SOLN
INTRAMUSCULAR | Status: DC | PRN
  Administered 2023-05-09: 4 mg via INTRAVENOUS

## 2023-05-09 MED ORDER — CHLORHEXIDINE GLUCONATE CLOTH 2 % EX PADS: 6.0000 | MEDICATED_PAD | Freq: Once | CUTANEOUS | Status: DC

## 2023-05-09 MED ORDER — ATORVASTATIN CALCIUM 20 MG PO TABS
20.0000 mg | ORAL_TABLET | Freq: Every day | ORAL | Status: DC
  Administered 2023-05-10 – 2023-05-13 (×4): 20 mg via ORAL
  Filled 2023-05-09 (×4): qty 1

## 2023-05-09 MED ORDER — OXYCODONE-ACETAMINOPHEN 5-325 MG PO TABS
2.0000 | ORAL_TABLET | ORAL | Status: DC | PRN
  Administered 2023-05-10 – 2023-05-13 (×5): 2 via ORAL
  Filled 2023-05-09 (×5): qty 2

## 2023-05-09 MED ORDER — HYDROMORPHONE HCL 1 MG/ML IJ SOLN
0.5000 mg | INTRAMUSCULAR | Status: DC | PRN
  Administered 2023-05-11: 1 mg via INTRAVENOUS
  Filled 2023-05-09: qty 1

## 2023-05-09 MED ORDER — ACETAMINOPHEN 10 MG/ML IV SOLN
INTRAVENOUS | Status: DC | PRN
  Administered 2023-05-09: 1000 mg via INTRAVENOUS

## 2023-05-09 MED ORDER — PROPOFOL 10 MG/ML IV BOLUS
INTRAVENOUS | Status: DC | PRN
  Administered 2023-05-09: 120 mg via INTRAVENOUS

## 2023-05-09 MED ORDER — TRAMADOL HCL 50 MG PO TABS
ORAL_TABLET | ORAL | Status: AC
  Filled 2023-05-09: qty 1

## 2023-05-09 MED ORDER — FENTANYL CITRATE (PF) 100 MCG/2ML IJ SOLN
INTRAMUSCULAR | Status: AC
  Filled 2023-05-09: qty 2

## 2023-05-09 MED ORDER — OXYCODONE HCL 5 MG PO TABS
5.0000 mg | ORAL_TABLET | Freq: Once | ORAL | Status: AC | PRN
  Administered 2023-05-09: 5 mg via ORAL

## 2023-05-09 MED ORDER — METOPROLOL TARTRATE 5 MG/5ML IV SOLN
2.0000 mg | INTRAVENOUS | Status: DC | PRN
Start: 2023-05-09 — End: 2023-05-14

## 2023-05-09 MED ORDER — PHENYLEPHRINE 80 MCG/ML (10ML) SYRINGE FOR IV PUSH (FOR BLOOD PRESSURE SUPPORT)
PREFILLED_SYRINGE | INTRAVENOUS | Status: DC | PRN
  Administered 2023-05-09: 160 ug via INTRAVENOUS
  Administered 2023-05-09: 80 ug via INTRAVENOUS

## 2023-05-09 MED ORDER — 0.9 % SODIUM CHLORIDE (POUR BTL) OPTIME
TOPICAL | Status: DC | PRN
  Administered 2023-05-09: 1000 mL

## 2023-05-09 MED ORDER — OXYCODONE HCL 5 MG PO TABS
5.0000 mg | ORAL_TABLET | ORAL | Status: DC | PRN
  Administered 2023-05-09 – 2023-05-10 (×4): 5 mg via ORAL
  Filled 2023-05-09 (×5): qty 1

## 2023-05-09 MED ORDER — OXYCODONE HCL 5 MG PO TABS
ORAL_TABLET | ORAL | Status: AC
  Filled 2023-05-09: qty 1

## 2023-05-09 MED ORDER — ACETAMINOPHEN 500 MG PO TABS: 500.0000 mg | ORAL_TABLET | Freq: Every evening | ORAL | Status: DC | PRN

## 2023-05-09 MED ORDER — MIDAZOLAM HCL 2 MG/2ML IJ SOLN
INTRAMUSCULAR | Status: AC
Start: 2023-05-09 — End: ?
  Filled 2023-05-09: qty 2

## 2023-05-09 MED ORDER — PHENOL 1.4 % MT LIQD: 1.0000 | OROMUCOSAL | Status: DC | PRN

## 2023-05-09 MED ORDER — MAGNESIUM SULFATE 2 GM/50ML IV SOLN: 2.0000 g | Freq: Every day | INTRAVENOUS | Status: DC | PRN

## 2023-05-09 MED ORDER — METOPROLOL TARTRATE 25 MG PO TABS
25.0000 mg | ORAL_TABLET | Freq: Two times a day (BID) | ORAL | Status: DC
  Administered 2023-05-09 – 2023-05-13 (×9): 25 mg via ORAL
  Filled 2023-05-09 (×9): qty 1

## 2023-05-09 MED ORDER — DIPHENHYDRAMINE HCL 25 MG PO CAPS: 25.0000 mg | ORAL_CAPSULE | Freq: Every evening | ORAL | Status: DC | PRN

## 2023-05-09 MED ORDER — GUAIFENESIN-DM 100-10 MG/5ML PO SYRP: 15.0000 mL | ORAL_SOLUTION | ORAL | Status: DC | PRN

## 2023-05-09 MED ORDER — POLYETHYLENE GLYCOL 3350 17 G PO PACK
17.0000 g | PACK | Freq: Every day | ORAL | Status: DC | PRN
Start: 2023-05-09 — End: 2023-05-14
  Administered 2023-05-12 – 2023-05-13 (×2): 17 g via ORAL
  Filled 2023-05-09 (×2): qty 1

## 2023-05-09 MED ORDER — CHLORHEXIDINE GLUCONATE CLOTH 2 % EX PADS
6.0000 | MEDICATED_PAD | Freq: Once | CUTANEOUS | Status: AC
  Administered 2023-05-09: 6 via TOPICAL

## 2023-05-09 MED ORDER — SODIUM CHLORIDE 0.9 % IV SOLN: INTRAVENOUS | Status: DC

## 2023-05-09 MED ORDER — HYDRALAZINE HCL 20 MG/ML IJ SOLN: 5.0000 mg | INTRAMUSCULAR | Status: DC | PRN

## 2023-05-09 MED ORDER — LACTATED RINGERS IV SOLN: INTRAVENOUS | Status: DC | PRN

## 2023-05-09 MED ORDER — ONDANSETRON HCL 4 MG/2ML IJ SOLN: 4.0000 mg | Freq: Four times a day (QID) | INTRAMUSCULAR | Status: DC | PRN

## 2023-05-09 MED ORDER — ALUM & MAG HYDROXIDE-SIMETH 200-200-20 MG/5ML PO SUSP: 15.0000 mL | ORAL | Status: DC | PRN

## 2023-05-09 MED ORDER — SORBITOL 70 % SOLN
30.0000 mL | Freq: Every day | Status: DC | PRN
  Administered 2023-05-14: 30 mL via ORAL
  Filled 2023-05-09 (×2): qty 30

## 2023-05-09 MED ORDER — DOCUSATE SODIUM 100 MG PO CAPS
100.0000 mg | ORAL_CAPSULE | Freq: Every day | ORAL | Status: DC
  Administered 2023-05-10 – 2023-05-13 (×4): 100 mg via ORAL
  Filled 2023-05-09 (×4): qty 1

## 2023-05-09 MED ORDER — LABETALOL HCL 5 MG/ML IV SOLN: 10.0000 mg | INTRAVENOUS | Status: DC | PRN

## 2023-05-09 MED ORDER — MIDAZOLAM HCL 2 MG/2ML IJ SOLN
INTRAMUSCULAR | Status: DC | PRN
  Administered 2023-05-09: 1 mg via INTRAVENOUS

## 2023-05-09 MED ORDER — CEFAZOLIN SODIUM-DEXTROSE 2-4 GM/100ML-% IV SOLN
INTRAVENOUS | Status: AC
  Filled 2023-05-09: qty 100

## 2023-05-09 MED ORDER — LIDOCAINE HCL (CARDIAC) PF 100 MG/5ML IV SOSY
PREFILLED_SYRINGE | INTRAVENOUS | Status: DC | PRN
  Administered 2023-05-09: 60 mg via INTRAVENOUS

## 2023-05-09 MED ORDER — ACETAMINOPHEN 10 MG/ML IV SOLN
INTRAVENOUS | Status: AC
  Filled 2023-05-09: qty 100

## 2023-05-09 MED ORDER — RIVAROXABAN 20 MG PO TABS
20.0000 mg | ORAL_TABLET | Freq: Every day | ORAL | Status: DC
  Administered 2023-05-10 – 2023-05-13 (×4): 20 mg via ORAL
  Filled 2023-05-09 (×5): qty 1

## 2023-05-09 MED ORDER — ASPIRIN 81 MG PO TBEC
81.0000 mg | DELAYED_RELEASE_TABLET | Freq: Every day | ORAL | Status: DC
  Administered 2023-05-09 – 2023-05-13 (×5): 81 mg via ORAL
  Filled 2023-05-09 (×5): qty 1

## 2023-05-09 MED ORDER — AMLODIPINE BESYLATE 5 MG PO TABS
10.0000 mg | ORAL_TABLET | Freq: Every day | ORAL | Status: DC
  Administered 2023-05-10 – 2023-05-13 (×4): 10 mg via ORAL
  Filled 2023-05-09 (×4): qty 2

## 2023-05-09 MED ORDER — CHLORHEXIDINE GLUCONATE 0.12 % MT SOLN
OROMUCOSAL | Status: AC
  Filled 2023-05-09: qty 15

## 2023-05-09 MED ORDER — DIPHENHYDRAMINE HCL 12.5 MG/5ML PO ELIX: 12.5000 mg | ORAL_SOLUTION | ORAL | Status: DC | PRN

## 2023-05-09 MED ORDER — ORAL CARE MOUTH RINSE: 15.0000 mL | Freq: Once | OROMUCOSAL | Status: AC

## 2023-05-09 MED ORDER — POTASSIUM CHLORIDE CRYS ER 20 MEQ PO TBCR: 20.0000 meq | EXTENDED_RELEASE_TABLET | Freq: Every day | ORAL | Status: DC | PRN

## 2023-05-09 SURGICAL SUPPLY — 35 items
BLADE SAGITTAL WIDE XTHICK NO (BLADE) IMPLANT
BLADE SAW SAG 25.4X90 (BLADE) ×1 IMPLANT
BNDG COHESIVE 4X5 TAN STRL LF (GAUZE/BANDAGES/DRESSINGS) ×1 IMPLANT
BNDG COHESIVE 6X5 TAN ST LF (GAUZE/BANDAGES/DRESSINGS) IMPLANT
BNDG GAUZE DERMACEA FLUFF 4 (GAUZE/BANDAGES/DRESSINGS) IMPLANT
BRUSH SCRUB EZ 4% CHG (MISCELLANEOUS) ×1 IMPLANT
CHLORAPREP W/TINT 26 (MISCELLANEOUS) ×1 IMPLANT
DRAPE INCISE IOBAN 66X45 STRL (DRAPES) IMPLANT
DRSG XEROFORM 1X8 (GAUZE/BANDAGES/DRESSINGS) IMPLANT
ELECT CAUTERY BLADE 6.4 (BLADE) ×1 IMPLANT
ELECT REM PT RETURN 9FT ADLT (ELECTROSURGICAL) ×1
ELECTRODE REM PT RTRN 9FT ADLT (ELECTROSURGICAL) ×1 IMPLANT
GAUZE SPONGE 4X4 12PLY STRL (GAUZE/BANDAGES/DRESSINGS) IMPLANT
GLOVE BIO SURGEON STRL SZ7 (GLOVE) ×2 IMPLANT
GOWN STRL REUS W/ TWL LRG LVL3 (GOWN DISPOSABLE) ×2 IMPLANT
GOWN STRL REUS W/TWL 2XL LVL3 (GOWN DISPOSABLE) ×1 IMPLANT
HANDLE YANKAUER SUCT BULB TIP (MISCELLANEOUS) ×1 IMPLANT
KIT TURNOVER KIT A (KITS) ×1 IMPLANT
LABEL OR SOLS (LABEL) ×1 IMPLANT
MANIFOLD NEPTUNE II (INSTRUMENTS) ×1 IMPLANT
MAT ABSORB FLUID 56X50 GRAY (MISCELLANEOUS) ×1 IMPLANT
NS IRRIG 1000ML POUR BTL (IV SOLUTION) ×1 IMPLANT
PACK EXTREMITY ARMC (MISCELLANEOUS) ×1 IMPLANT
PAD ABD DERMACEA PRESS 5X9 (GAUZE/BANDAGES/DRESSINGS) IMPLANT
PAD PREP OB/GYN DISP 24X41 (PERSONAL CARE ITEMS) ×1 IMPLANT
SPONGE T-LAP 18X18 ~~LOC~~+RFID (SPONGE) ×1 IMPLANT
STAPLER SKIN PROX 35W (STAPLE) ×1 IMPLANT
STOCKINETTE M/LG 89821 (MISCELLANEOUS) ×1 IMPLANT
SUT SILK 2 0 SH (SUTURE) ×2 IMPLANT
SUT SILK 2-0 18XBRD TIE 12 (SUTURE) ×1 IMPLANT
SUT SILK 3-0 18XBRD TIE 12 (SUTURE) ×1 IMPLANT
SUT VIC AB 0 CT1 36 (SUTURE) ×2 IMPLANT
SUT VIC AB 2-0 CT1 (SUTURE) ×2 IMPLANT
TRAP FLUID SMOKE EVACUATOR (MISCELLANEOUS) ×1 IMPLANT
WATER STERILE IRR 500ML POUR (IV SOLUTION) ×1 IMPLANT

## 2023-05-09 NOTE — Anesthesia Preprocedure Evaluation (Signed)
Anesthesia Evaluation  Patient identified by MRN, date of birth, ID band Patient awake    Reviewed: Allergy & Precautions, NPO status , Patient's Chart, lab work & pertinent test results  Airway Mallampati: I  TM Distance: >3 FB Neck ROM: full    Dental  (+) Chipped, Dental Advidsory Given   Pulmonary neg pulmonary ROS, former smoker   Pulmonary exam normal        Cardiovascular hypertension, + CAD and + Peripheral Vascular Disease  + dysrhythmias Supra Ventricular Tachycardia      Neuro/Psych negative neurological ROS  negative psych ROS   GI/Hepatic negative GI ROS, Neg liver ROS,,,  Endo/Other  negative endocrine ROS    Renal/GU Renal disease     Musculoskeletal   Abdominal   Peds  Hematology negative hematology ROS (+)   Anesthesia Other Findings Past Medical History: No date: Atherosclerosis of native arteries of the extremities with  gangrene (HCC) No date: 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) No date: Bilateral carotid artery stenosis No date: Dysrhythmia No date: Hyperlipidemia No date: Hypertension No date: Ischemic leg No date: Peripheral vascular disease (HCC) No date: PSVT (paroxysmal supraventricular tachycardia) (HCC) No date: Syncope  Past Surgical History: No date: ABDOMINAL HYSTERECTOMY 10/02/2021: LOWER EXTREMITY ANGIOGRAPHY; Right     Comment:  Procedure: Lower Extremity Angiography;  Surgeon: Annice Needy, MD;  Location: [facility];  Service:               Cardiovascular;  Laterality: Right; 10/09/2021: LOWER EXTREMITY ANGIOGRAPHY; Left     Comment:  Procedure: Lower Extremity Angiography;  Surgeon: Annice Needy, MD;  Location: [facility];  Service:               Cardiovascular;  Laterality: Left; 10/27/2021: LOWER EXTREMITY ANGIOGRAPHY; Right     Comment:  Procedure: Lower  Extremity Angiography;  Surgeon: Annice Needy, MD;  Location: [facility];  Service:               Cardiovascular;  Laterality: Right; 02/15/2022: LOWER EXTREMITY ANGIOGRAPHY; Right     Comment:  Procedure: Lower Extremity Angiography;  Surgeon: Annice Needy, MD;  Location: [facility];  Service:               Cardiovascular;  Laterality: Right; 02/28/2022: LOWER EXTREMITY ANGIOGRAPHY; Right     Comment:  Procedure: Lower Extremity Angiography;  Surgeon: Annice Needy, MD;  Location: [facility];  Service:               Cardiovascular;  Laterality: Right; 02/18/2023: LOWER EXTREMITY ANGIOGRAPHY; Right     Comment:  Procedure: Lower Extremity Angiography;  Surgeon: Annice Needy, MD;  Location: [facility];  Service:               Cardiovascular;  Laterality: Right; 02/19/2023: LOWER EXTREMITY ANGIOGRAPHY; Right     Comment:  Procedure: Lower Extremity  Angiography;  Surgeon: Annice Needy, MD;  Location: [facility];  Service:               Cardiovascular;  Laterality: Right; 10/27/2021: LOWER EXTREMITY INTERVENTION; Right     Comment:  Procedure: LOWER EXTREMITY INTERVENTION;  Surgeon: Annice Needy, MD;  Location: [facility];  Service:               Cardiovascular;  Laterality: Right; 02/15/2022: LOWER EXTREMITY INTERVENTION; Right     Comment:  Procedure: LOWER EXTREMITY INTERVENTION;  Surgeon: Annice Needy, MD;  Location: [facility];  Service:               Cardiovascular;  Laterality: Right; No date: PARTIAL HYSTERECTOMY 03/01/2022: PERIPHERAL VASCULAR THROMBECTOMY; Right     Comment:  Procedure: PERIPHERAL VASCULAR THROMBECTOMY;  Surgeon:               Annice Needy, MD;  Location: [facility];                Service: Cardiovascular;  Laterality: Right;     Reproductive/Obstetrics negative OB ROS                              Anesthesia Physical Anesthesia Plan  ASA: 3  Anesthesia Plan: General ETT and General   Post-op Pain Management:    Induction: Intravenous  PONV Risk Score and Plan: 3 and Ondansetron, Dexamethasone and Midazolam  Airway Management Planned: Oral ETT  Additional Equipment:   Intra-op Plan:   Post-operative Plan: Extubation in OR  Informed Consent: I have reviewed the patients History and Physical, chart, labs and discussed the procedure including the risks, benefits and alternatives for the proposed anesthesia with the patient or authorized representative who has indicated his/her understanding and acceptance.     Dental Advisory Given  Plan Discussed with: Anesthesiologist, CRNA and Surgeon  Anesthesia Plan Comments: (Patient consented for risks of anesthesia including but not limited to:  - adverse reactions to medications - damage to eyes, teeth, lips or other oral mucosa - nerve damage due to positioning  - sore throat or hoarseness - Damage to heart, brain, nerves, lungs, other parts of body or loss of life  Patient voiced understanding and assent.)       Anesthesia Quick Evaluation

## 2023-05-09 NOTE — Plan of Care (Signed)
Patient transferred to 1A ortho unit from PACU after R BKA sugery. Patient alert and oriented. Removed [place], patient's O2 saturation remained at 97% on RA. Vital signs stable, 4/10 pain but declines intervention at this time. Will continue to monitor.  Problem: Education: Goal: Knowledge of General Education information will improve Description: Including pain rating scale, medication(s)/side effects and non-pharmacologic comfort measures Outcome: Progressing   Problem: Health Behavior/Discharge Planning: Goal: Ability to manage health-related needs will improve Outcome: Progressing   Problem: Clinical Measurements: Goal: Ability to maintain clinical measurements within normal limits will improve Outcome: Progressing Goal: Will remain free from infection Outcome: Progressing Goal: Diagnostic test results will improve Outcome: Progressing Goal: Respiratory complications will improve Outcome: Progressing Goal: Cardiovascular complication will be avoided Outcome: Progressing   Problem: Activity: Goal: Risk for activity intolerance will decrease Outcome: Progressing   Problem: Nutrition: Goal: Adequate nutrition will be maintained Outcome: Progressing   Problem: Coping: Goal: Level of anxiety will decrease Outcome: Progressing   Problem: Elimination: Goal: Will not experience complications related to bowel motility Outcome: Progressing Goal: Will not experience complications related to urinary retention Outcome: Progressing   Problem: Pain Management: Goal: General experience of comfort will improve Outcome: Progressing   Problem: Safety: Goal: Ability to remain free from injury will improve Outcome: Progressing   Problem: Skin Integrity: Goal: Risk for impaired skin integrity will decrease Outcome: Progressing   Problem: Education: Goal: Knowledge of the prescribed therapeutic regimen will improve Outcome: Progressing Goal: Ability to verbalize activity precautions  or restrictions will improve Outcome: Progressing Goal: Understanding of discharge needs will improve Outcome: Progressing   Problem: Activity: Goal: Ability to perform//tolerate increased activity and mobilize with assistive devices will improve Outcome: Progressing   Problem: Clinical Measurements: Goal: Postoperative complications will be avoided or minimized Outcome: Progressing   Problem: Self-Care: Goal: Ability to meet self-care needs will improve Outcome: Progressing   Problem: Self-Concept: Goal: Ability to maintain and perform role responsibilities to the fullest extent possible will improve Outcome: Progressing   Problem: Pain Management: Goal: Pain level will decrease with appropriate interventions Outcome: Progressing   Problem: Skin Integrity: Goal: Demonstration of wound healing without infection will improve Outcome: Progressing

## 2023-05-09 NOTE — Anesthesia Procedure Notes (Signed)
Procedure Name: LMA Insertion Date/Time: 05/09/2023 9:42 AM  Performed by: Ginger Carne, CRNAPre-anesthesia Checklist: Patient identified, Patient being monitored, Timeout performed, Emergency Drugs available and Suction available Patient Re-evaluated:Patient Re-evaluated prior to induction Oxygen Delivery Method: Circle system utilized Preoxygenation: Pre-oxygenation with 100% oxygen Induction Type: IV induction Ventilation: Mask ventilation without difficulty LMA: LMA inserted LMA Size: 3.0 Tube type: Oral Number of attempts: 1 Placement Confirmation: positive ETCO2 and breath sounds checked- equal and bilateral Tube secured with: Tape Dental Injury: Teeth and Oropharynx as per pre-operative assessment

## 2023-05-09 NOTE — Anesthesia Postprocedure Evaluation (Signed)
Anesthesia Post Note  Patient: Yvonne [name]  Procedure(s) Performed: AMPUTATION BELOW KNEE (Right: Knee)  Patient location during evaluation: PACU Anesthesia Type: General Level of consciousness: awake and alert Pain management: pain level controlled Vital Signs Assessment: post-procedure vital signs reviewed and stable Respiratory status: spontaneous breathing, nonlabored ventilation, respiratory function stable and patient connected to nasal cannula oxygen Cardiovascular status: blood pressure returned to baseline and stable Postop Assessment: no apparent nausea or vomiting Anesthetic complications: no  No notable events documented.   Last Vitals:  Vitals:   05/09/23 1045 05/09/23 1048  BP: 122/62 122/62  Pulse: 70 64  Resp: (!) 23 13  Temp: 37.2 C   SpO2: 100% 100%    Last Pain:  Vitals:   05/09/23 0850  TempSrc: Temporal  PainSc: 3                  Stephanie Coup

## 2023-05-09 NOTE — Op Note (Signed)
   OPERATIVE NOTE   PROCEDURE: Right below-the-knee amputation  PRE-OPERATIVE DIAGNOSIS: Right foot gangrene, rest pain, not unreconstructable irreversible ischemia  POST-OPERATIVE DIAGNOSIS: same as above  SURGEON: [name], MD  ASSISTANT(S): None  ANESTHESIA: general  ESTIMATED BLOOD LOSS: 300 cc  FINDING(S): None  SPECIMEN(S):  Right below-the-knee amputation  INDICATIONS:   Yvonne [name] is a 67 y.o. female who presents with right leg gangrene.  The patient is scheduled for a right below-the-knee amputation.  I discussed in depth with the patient the risks, benefits, and alternatives to this procedure.  The patient is aware that the risk of this operation included but are not limited to:  bleeding, infection, myocardial infarction, stroke, death, failure to heal amputation wound, and possible need for more proximal amputation.  The patient is aware of the risks and agrees proceed forward with the procedure.  DESCRIPTION:  After full informed written consent was obtained from the patient, the patient was brought back to the operating room, and placed supine upon the operating table.  Prior to induction, the patient received IV antibiotics.  The patient was then prepped and draped in the standard fashion for a below-the-knee amputation.  After obtaining adequate anesthesia, the patient was prepped and draped in the standard fashion for a right below-the-knee amputation.  I marked out the anterior incision two finger breadths below the tibial tuberosity and then the marked out a posterior flap that was one third of the circumference of the calf in length.   I made the incisions for these flaps, and then dissected through the subcutaneous tissue, fascia, and muscle anteriorly.  I elevated  the periosteal tissue superiorly so that the tibia was about 3-4 cm shorter than the anterior skin flap.  I then transected the tibia with a power saw and then took a wedge off the tibia anteriorly  with the power saw.  Then I smoothed out the rough edges.  In a similar fashion, I cut back the fibula about two centimeters higher than the level of the tibia with a bone cutter.  I put a bone hook into the distal tibia and then used a large amputation knife to sharply develop a tissue plane through the muscle along the fibula.  In such fashion, the posterior flap was developed.  At this point, the specimen was passed off the field as the below-the-knee amputation.  At this point, I clamped all visibly bleeding arteries and veins using a combination of suture ligation with Silk suture and electrocautery.  Bleeding continued to be controlled with electrocautery and suture ligature.  The stump was washed off with sterile normal saline and no further active bleeding was noted.  I reapproximated the anterior and posterior fascia  with interrupted stitches of 0 Vicryl.  This was completed along the entire length of anterior and posterior fascia until there were no more loose space in the fascial line. I then placed a layer of 2-0 Vicryl sutures in the subcutaneous tissue. The skin was then  reapproximated with staples.  The stump was washed off and dried.  The incision was dressed with Xeroform and  then fluffs were applied.  Kerlix was wrapped around the leg and then gently an ACE wrap was applied.    COMPLICATIONS: none  CONDITION: stable   [name]  05/09/2023, 11:16 AM    This note was created with Dragon Medical transcription system. Any errors in dictation are purely unintentional.

## 2023-05-09 NOTE — Interval H&P Note (Signed)
History and Physical Interval Note:  05/09/2023 9:01 AM  Yvonne [name]  has presented today for surgery, with the diagnosis of ASO WITH GANGRENE.  The various methods of treatment have been discussed with the patient and family. After consideration of risks, benefits and other options for treatment, the patient has consented to  Procedure(s): AMPUTATION BELOW KNEE (Right) as a surgical intervention.  The patient's history has been reviewed, patient examined, no change in status, stable for surgery.  I have reviewed the patient's chart and labs.  Questions were answered to the patient's satisfaction.     [name]

## 2023-05-09 NOTE — Transfer of Care (Signed)
Immediate Anesthesia Transfer of Care Note  Patient: Yvonne [name]  Procedure(s) Performed: AMPUTATION BELOW KNEE (Right: Knee)  Patient Location: PACU  Anesthesia Type:General  Level of Consciousness: sedated  Airway & Oxygen Therapy: Patient Spontanous Breathing and Patient connected to face mask oxygen  Post-op Assessment: Report given to RN and Post -op Vital signs reviewed and stable  Post vital signs: Reviewed and stable  Last Vitals:  Vitals Value Taken Time  BP 122/62 05/09/23 1048  Temp 37.2 C 05/09/23 1045  Pulse 64 05/09/23 1049  Resp 14 05/09/23 1049  SpO2 100 % 05/09/23 1049  Vitals shown include unfiled device data.  Last Pain:  Vitals:   05/09/23 0850  TempSrc: Temporal  PainSc: 3          Complications: No notable events documented.

## 2023-05-10 ENCOUNTER — Encounter: Payer: Self-pay | Admitting: Vascular Surgery

## 2023-05-10 LAB — CBC
HCT: 29.5 % — ABNORMAL LOW (ref 36.0–46.0)
Hemoglobin: 9.7 g/dL — ABNORMAL LOW (ref 12.0–15.0)
MCH: 32.1 pg (ref 26.0–34.0)
MCHC: 32.9 g/dL (ref 30.0–36.0)
MCV: 97.7 fL (ref 80.0–100.0)
Platelets: 299 10*3/uL (ref 150–400)
RBC: 3.02 MIL/uL — ABNORMAL LOW (ref 3.87–5.11)
RDW: 13.9 % (ref 11.5–15.5)
WBC: 10.6 10*3/uL — ABNORMAL HIGH (ref 4.0–10.5)
nRBC: 0 % (ref 0.0–0.2)

## 2023-05-10 LAB — BASIC METABOLIC PANEL
Anion gap: 6 (ref 5–15)
BUN: 8 mg/dL (ref 8–23)
CO2: 23 mmol/L (ref 22–32)
Calcium: 8.7 mg/dL — ABNORMAL LOW (ref 8.9–10.3)
Chloride: 108 mmol/L (ref 98–111)
Creatinine, Ser: 0.53 mg/dL (ref 0.44–1.00)
GFR, Estimated: >60 mL/min (ref >60)
Glucose, Bld: 165 mg/dL — ABNORMAL HIGH (ref 70–99)
Potassium: 3.7 mmol/L (ref 3.5–5.1)
Sodium: 137 mmol/L (ref 135–145)

## 2023-05-10 NOTE — TOC Initial Note (Signed)
Transition of Care [facility]) - Initial/Assessment Note    Patient Details  Name: Yvonne [name] MRN: [id number] Date of Birth: 06/16/1955  Transition of Care [facility]) CM/SW Contact:    Marlowe Sax, RN Phone Number: 05/10/2023, 5:10 PM  Clinical Narrative:                 Met with the patient in the room at the bedside, she lives alone in a 2 story home; stays on main floor; brother also lives at boarding house; 2 sisters live close; 1STE, tub/shower, grab bars in shower, standard toilet. Has SPC, no other equipment.   RW and 3 in 1 to be delivered by [employer], [facility] was sent the referral for [facility] to provide transportation home   Expected Discharge Plan: Home w Home Health Services Barriers to Discharge: Continued Medical Work up   Patient Goals and CMS Choice            Expected Discharge Plan and Services   Discharge Planning Services: CM Consult   Living arrangements for the past 2 months: Single Family Home                 DME Arranged: Walker rolling, 3-N-1 DME Agency: [employer] Date DME Agency Contacted: 05/10/23 Time DME Agency Contacted: 3 Representative spoke with at DME Agency: Cletis [place] HH Arranged: PT, OT HH Agency: [facility] Date [facility] Agency Contacted: 05/10/23 Time HH Agency Contacted: [id number] Representative spoke with at [facility] Agency: [place]  Prior Living Arrangements/Services Living arrangements for the past 2 months: Single Family Home Lives with:: Self Patient language and need for interpreter reviewed:: Yes Do you feel safe going back to the place where you live?: Yes      Need for Family Participation in Patient Care: Yes (Comment) Care giver support system in place?: Yes (comment) Current home services: DME (cane) Criminal Activity/Legal Involvement Pertinent to Current Situation/Hospitalization: No - Comment as needed  Activities of Daily Living   ADL Screening (condition at time of admission) Independently performs ADLs?:  Yes (appropriate for developmental age) Is the patient deaf or have difficulty hearing?: No Does the patient have difficulty seeing, even when wearing glasses/contacts?: No Does the patient have difficulty concentrating, remembering, or making decisions?: No  Permission Sought/Granted   Permission granted to share information with : Yes, Verbal Permission Granted              Emotional Assessment Appearance:: Appears stated age Attitude/Demeanor/Rapport: Engaged Affect (typically observed): Accepting Orientation: : Oriented to Self, Oriented to Place, Oriented to  Time, Oriented to Situation Alcohol / Substance Use: Not Applicable Psych Involvement: No (comment)  Admission diagnosis:  Ischemic leg [I99.8] Patient Active Problem List   Diagnosis Date Noted   Atherosclerosis 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) 04/30/2023   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 [place] ([place]) [place] [address] Phone: [phone] Fax: [phone]  Social Drivers of Health (SDOH) Social History: SDOH Screenings   Food Insecurity: No Food Insecurity (05/09/2023)  Recent Concern: Food Insecurity - Food Insecurity Present (02/18/2023)  Housing: Unknown (05/09/2023)  Transportation Needs: No Transportation Needs (05/09/2023)  Utilities: Not At Risk (05/09/2023)   Tobacco Use: Medium Risk (05/09/2023)   SDOH Interventions:     Readmission Risk Interventions     No data to display

## 2023-05-10 NOTE — Evaluation (Signed)
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 PMHx includes HTN, CAD, PVD with revascularlization procedures, PSVT, HLD.   Clinical Impression   Pt was seen for OT evaluation this date. Prior to hospital admission, pt was independent with ADL, had assist for transportation but otherwise indep with IADL, and using a SPC for mobility. She lives in a boarding house with a shared bathroom and her brother lives in another bedroom of the same boarding house. Pt presents to acute OT demonstrating impaired ADL performance and functional mobility 2/2 residual limb pain and impaired balance (See OT problem list for additional functional deficits). Pt currently requires CGA for ADL transfers with RW and CGA for taking small shuffled steps/hops. She reports using lateral lean for LB dressing at baseline. She also reports brother can assist PRN, particularly with moving a tub transfer bench (recommended) in and out of the shared bathroom for when pt needs to shower. Pt educated in AE/DME for toileting, bathing. Pt instructed in desensitization strategies and positioning for R residual limb to promote optimal full ROM and comfort/pain relief as well as strategies to minimize/address phantom pains. Pt verbalized understanding. Pt would benefit from skilled OT services to address noted impairments and functional limitations (see below for any additional details) in order to maximize safety and independence while minimizing falls risk and caregiver burden.     If plan is discharge home, recommend the following: A little help with walking and/or transfers;A little help with bathing/dressing/bathroom;Assistance with cooking/housework;Assist for transportation;Help with stairs or ramp for entrance    Functional Status Assessment  Patient has had a  recent decline in their functional status and demonstrates the ability to make significant improvements in function in a reasonable and predictable amount of time.  Equipment Recommendations  BSC/3in1;Tub/shower bench;Other (comment);Wheelchair cushion (measurements OT);Wheelchair (measurements OT) (2WW)    Recommendations for Other Services       Precautions / Restrictions Precautions Precautions: Fall Precaution Comments: R BKA Restrictions Weight Bearing Restrictions Per Provider Order: Yes RLE Weight Bearing Per Provider Order: Non weight bearing Other Position/Activity Restrictions: R BKA      Mobility Bed Mobility Overal bed mobility: Needs Assistance Bed Mobility: Supine to Sit     Supine to sit: Supervision     General bed mobility comments: increased time    Transfers Overall transfer level: Needs assistance Equipment used: Rolling walker (2 wheels) Transfers: Sit to/from Stand Sit to Stand: Contact guard assist           General transfer comment: CGA +2 provided for initial stand but needs only CGA x1      Balance Overall balance assessment: Needs assistance Sitting-balance support: No upper extremity supported, Feet supported, Feet unsupported Sitting balance-Leahy Scale: Good     Standing balance support: Bilateral upper extremity supported, During functional activity, Reliant on assistive device for balance Standing balance-Leahy Scale: Fair                             ADL either performed or assessed with clinical judgement   ADL                                         General ADL Comments: Pt reports  using lateral lean approach for LB dressing at baseline and denies difficulty with it at this time. Pt requires CGA for ADL transfers using heavy BUE support on RW for small shuffled hops. Limited physical assist required for ADL from seated position.     Vision         Perception         Praxis          Pertinent Vitals/Pain Pain Assessment Pain Assessment: 0-10 Pain Score: 6  Pain Location: behind R knee Pain Descriptors / Indicators: Aching Pain Intervention(s): Limited activity within patient's tolerance, Monitored during session, Premedicated before session, Repositioned     Extremity/Trunk Assessment Upper Extremity Assessment Upper Extremity Assessment: Overall WFL for tasks assessed   Lower Extremity Assessment Lower Extremity Assessment: Defer to PT evaluation;RLE deficits/detail RLE Deficits / Details: s/p R BKA, some phantom pain       Communication Communication Communication: No apparent difficulties   Cognition Arousal: Alert Behavior During Therapy: WFL for tasks assessed/performed Overall Cognitive Status: Within Functional Limits for tasks assessed                                       General Comments       Exercises Other Exercises Other Exercises: Pt instructed in desensitization strategies and positioning for R residual limb to promote optimal full ROM and comfort/pain relief as well as strategies to minimize/address phantom pains.   Shoulder Instructions      Home Living Family/patient expects to be discharged to:: Other (Comment) (boarding house) Living Arrangements: Alone                               Additional Comments: 2 story home, stays on main floor, brother and sisters lives close, 1STE, tub/shower, grab bars in shower, standard toilet. Has SPC, no other equipment.      Prior Functioning/Environment Prior Level of Function : History of Falls (last six months);Independent/Modified Independent             Mobility Comments: SPC for mobility. Last fall about 1-43mo ago 2/2 LOB and legs gave out getting out of the car. ADLs Comments: Indep with ADL. Friend provides transportation to grocery store. Gets Lyft for doctor appts.        OT Problem List: Decreased strength;Pain;Impaired balance (sitting  and/or standing);Decreased knowledge of use of DME or AE;Decreased knowledge of precautions      OT Treatment/Interventions: Self-care/ADL training;Therapeutic exercise;Therapeutic activities;DME and/or AE instruction;Patient/family education;Balance training    OT Goals(Current goals can be found in the care plan section) Acute Rehab OT Goals Patient Stated Goal: get better OT Goal Formulation: With patient Time For Goal Achievement: 05/24/23 Potential to Achieve Goals: Good ADL Goals Pt Will Perform Lower Body Dressing: with modified independence;sitting/lateral leans Pt Will Transfer to Toilet: with modified independence;bedside commode;regular height toilet;stand pivot transfer Pt Will Perform Toileting - Clothing Manipulation and hygiene: with modified independence Pt Will Perform Tub/Shower Transfer: tub bench;with modified independence;Stand pivot transfer Additional ADL Goal #1: Pt will demonstrate independence with desensitization strategies for residual limb care.  OT Frequency: Min 1X/week    Co-evaluation PT/OT/SLP Co-Evaluation/Treatment: Yes Reason for Co-Treatment: To address functional/ADL transfers PT goals addressed during session: Mobility/safety with mobility;Balance;Proper use of DME OT goals addressed during session: ADL's and self-care;Proper use of Adaptive equipment and DME  AM-PAC OT "6 Clicks" Daily Activity     Outcome Measure Help from another person eating meals?: None Help from another person taking care of personal grooming?: None Help from another person toileting, which includes using toliet, bedpan, or urinal?: A Little Help from another person bathing (including washing, rinsing, drying)?: A Little Help from another person to put on and taking off regular upper body clothing?: None Help from another person to put on and taking off regular lower body clothing?: A Little 6 Click Score: 21   End of Session Equipment Utilized During Treatment:  Rolling walker (2 wheels);Gait belt  Activity Tolerance: Patient tolerated treatment well Patient left: in chair;with call bell/phone within reach;with chair alarm set  OT Visit Diagnosis: Other abnormalities of gait and mobility (R26.89);Pain Pain - Right/Left: Right Pain - part of body: Leg;Knee                Time: 1610-9604 OT Time Calculation (min): 25 min Charges:  OT General Charges $OT Visit: 1 Visit OT Evaluation $OT Eval Low Complexity: 1 Low  Arman Filter., MPH, MS, OTR/L ascom [phone] 05/10/23, 10:50 AM

## 2023-05-10 NOTE — Evaluation (Signed)
Physical Therapy Evaluation Patient Details Name: Yvonne [name] MRN: [id number] DOB: [birth year] Today's Date: 05/10/2023  History of Present Illness  Pt is a 67 y.o. female presenting with severe peripheral arterial disease with rest pain and gangrenous changes to R foot; h/o multiple previous revascularization procedures.  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  PMH includes htn, PVD, syncope, peripheral vascular thrombectomy, PSVT.  Clinical Impression  PT/OT co-evaluation performed.  Prior to surgery, pt was modified independent with functional mobility (used [facility]); lives at a boarding house (1 STE no railing; stays on main level).  R LE residual limb pain 6/10 at rest beginning of session and 5/10 at rest end of session (pt pre-medicated with pain medication).  Currently pt is SBA semi-supine to sitting EOB; CGA x2 to stand from bed up to RW (2 assist for safety but only 1 assist required); and CGA (SBA of 2nd for safety/equipment) to transfer bed to recliner with RW use.  Pt would currently benefit from skilled PT to address noted impairments and functional limitations (see below for any additional details).  Upon hospital discharge, pt would benefit from ongoing therapy.     If plan is discharge home, recommend the following: A little help with walking and/or transfers;A little help with bathing/dressing/bathroom;Assistance with cooking/housework;Assist for transportation;Help with stairs or ramp for entrance   Can travel by private vehicle        Equipment Recommendations Rolling walker (2 wheels);BSC/3in1;Wheelchair (measurements PT);Wheelchair cushion (measurements PT);Other (comment) (tub transfer bench)  Recommendations for Other Services       Functional Status Assessment Patient has had a recent decline in their functional status and demonstrates the ability to make significant improvements in function in a reasonable and predictable amount of time.     Precautions / Restrictions  Precautions Precautions: Fall Precaution Comments: R BKA Restrictions Weight Bearing Restrictions Per Provider Order: Yes RLE Weight Bearing Per Provider Order: Non weight bearing Other Position/Activity Restrictions: R BKA      Mobility  Bed Mobility Overal bed mobility: Needs Assistance Bed Mobility: Supine to Sit     Supine to sit: Supervision     General bed mobility comments: increased time to perform on own    Transfers Overall transfer level: Needs assistance Equipment used: Rolling walker (2 wheels) Transfers: Sit to/from Stand, Bed to chair/wheelchair/BSC Sit to Stand: Contact guard assist   Step pivot transfers: Contact guard assist, +2 safety/equipment       General transfer comment: CGA +2 provided for initial stand (for safety) but needs only CGA x1; hop to pattern bed to recliner with RW use; initial vc's for UE placement    Ambulation/Gait                  Stairs            Wheelchair Mobility     Tilt Bed    Modified Rankin (Stroke Patients Only)       Balance Overall balance assessment: Needs assistance Sitting-balance support: No upper extremity supported, Feet supported, Feet unsupported Sitting balance-Leahy Scale: Good Sitting balance - Comments: steady reaching within BOS   Standing balance support: Bilateral upper extremity supported, During functional activity, Reliant on assistive device for balance Standing balance-Leahy Scale: Fair Standing balance comment: steady static standing with B UE support on RW                             Pertinent Vitals/Pain Pain Assessment Pain Assessment:  0-10 Pain Score: 6  Pain Location: Behind R knee Pain Descriptors / Indicators: Aching Pain Intervention(s): Limited activity within patient's tolerance, Monitored during session, Premedicated before session, Repositioned Vitals (HR and SpO2 on room air) stable and WFL throughout treatment session.    Home Living  Family/patient expects to be discharged to:: Other (Comment) (Boarding house) Living Arrangements: Alone                 Additional Comments: 2 story home; stays on main floor; brother also lives at boarding house; 2 sisters live close; 1STE, tub/shower, grab bars in shower, standard toilet. Has SPC, no other equipment.    Prior Function Prior Level of Function : History of Falls (last six months);Independent/Modified Independent             Mobility Comments: SPC for mobility. Last fall about 1-66mo ago 2/2 LOB and legs gave out getting out of the car. ADLs Comments: Indep with ADL. Friend provides transportation to grocery store. Gets Lyft for doctor appts.     Extremity/Trunk Assessment   Upper Extremity Assessment Upper Extremity Assessment: Overall WFL for tasks assessed    Lower Extremity Assessment Lower Extremity Assessment: RLE deficits/detail RLE Deficits / Details: Minimal R knee flexion (grossly 20 degrees--pt reports bandages restricting further motion); R knee extension about 5 degrees short of neutral; some phantom pain reported RLE: Unable to fully assess due to pain    Cervical / Trunk Assessment Cervical / Trunk Assessment: Normal  Communication   Communication Communication: No apparent difficulties Cueing Techniques: Verbal cues  Cognition Arousal: Alert Behavior During Therapy: WFL for tasks assessed/performed Overall Cognitive Status: Within Functional Limits for tasks assessed                                          General Comments General comments (skin integrity, edema, etc.): R LE residual limb dressings in place    Exercises Amputee Exercises Quad Sets: AROM, Strengthening, Right, 10 reps, Supine Knee Flexion: AROM, Strengthening, Right, 10 reps, Supine (limited ROM (pt reports d/t dressings))   Assessment/Plan    PT Assessment Patient needs continued PT services  PT Problem List Decreased strength;Decreased range  of motion;Decreased activity tolerance;Decreased balance;Decreased mobility;Decreased knowledge of use of DME;Decreased knowledge of precautions;Decreased skin integrity;Pain       PT Treatment Interventions DME instruction;Gait training;Stair training;Functional mobility training;Therapeutic activities;Therapeutic exercise;Balance training;Patient/family education;Wheelchair mobility training    PT Goals (Current goals can be found in the Care Plan section)  Acute Rehab PT Goals Patient Stated Goal: to improve strength and functional mobility PT Goal Formulation: With patient Time For Goal Achievement: 05/24/23 Potential to Achieve Goals: Good    Frequency 7X/week     Co-evaluation PT/OT/SLP Co-Evaluation/Treatment: Yes Reason for Co-Treatment: To address functional/ADL transfers PT goals addressed during session: Mobility/safety with mobility;Balance;Proper use of DME OT goals addressed during session: ADL's and self-care;Proper use of Adaptive equipment and DME       AM-PAC PT "6 Clicks" Mobility  Outcome Measure Help needed turning from your back to your side while in a flat bed without using bedrails?: None Help needed moving from lying on your back to sitting on the side of a flat bed without using bedrails?: A Little Help needed moving to and from a bed to a chair (including a wheelchair)?: A Little Help needed standing up from a chair using your arms (e.g., wheelchair  or bedside chair)?: A Little Help needed to walk in hospital room?: A Little Help needed climbing 3-5 steps with a railing? : A Lot 6 Click Score: 18    End of Session Equipment Utilized During Treatment: Gait belt Activity Tolerance: Patient tolerated treatment well Patient left: in chair;with call bell/phone within reach;with chair alarm set Nurse Communication: Mobility status;Precautions;Weight bearing status PT Visit Diagnosis: Other abnormalities of gait and mobility (R26.89);Muscle weakness  (generalized) (M62.81);Pain Pain - Right/Left: Right Pain - part of body: Leg    Time: 0927-0952 PT Time Calculation (min) (ACUTE ONLY): 25 min   Charges:   PT Evaluation $PT Eval Low Complexity: 1 Low   PT General Charges $$ ACUTE PT VISIT: 1 Visit        Hendricks Limes, PT 05/10/23, 1:49 PM

## 2023-05-10 NOTE — Plan of Care (Signed)

## 2023-05-10 NOTE — Plan of Care (Signed)
  Problem: Education: Goal: Knowledge of General Education information will improve Description: Including pain rating scale, medication(s)/side effects and non-pharmacologic comfort measures Outcome: Progressing   Problem: Health Behavior/Discharge Planning: Goal: Ability to manage health-related needs will improve Outcome: Progressing   Problem: Clinical Measurements: Goal: Ability to maintain clinical measurements within normal limits will improve Outcome: Progressing Goal: Will remain free from infection Outcome: Progressing Goal: Diagnostic test results will improve Outcome: Progressing Goal: Respiratory complications will improve Outcome: Progressing Goal: Cardiovascular complication will be avoided Outcome: Progressing   Problem: Activity: Goal: Risk for activity intolerance will decrease Outcome: Progressing   Problem: Nutrition: Goal: Adequate nutrition will be maintained Outcome: Progressing   Problem: Coping: Goal: Level of anxiety will decrease Outcome: Progressing   Problem: Elimination: Goal: Will not experience complications related to bowel motility Outcome: Progressing Goal: Will not experience complications related to urinary retention Outcome: Progressing   Problem: Pain Management: Goal: General experience of comfort will improve Outcome: Progressing   Problem: Safety: Goal: Ability to remain free from injury will improve Outcome: Progressing   Problem: Skin Integrity: Goal: Risk for impaired skin integrity will decrease Outcome: Progressing   Problem: Education: Goal: Knowledge of the prescribed therapeutic regimen will improve Outcome: Progressing Goal: Ability to verbalize activity precautions or restrictions will improve Outcome: Progressing Goal: Understanding of discharge needs will improve Outcome: Progressing   Problem: Activity: Goal: Ability to perform//tolerate increased activity and mobilize with assistive devices will  improve Outcome: Progressing   Problem: Clinical Measurements: Goal: Postoperative complications will be avoided or minimized Outcome: Progressing   Problem: Self-Care: Goal: Ability to meet self-care needs will improve Outcome: Progressing   Problem: Self-Concept: Goal: Ability to maintain and perform role responsibilities to the fullest extent possible will improve Outcome: Progressing   Problem: Pain Management: Goal: Pain level will decrease with appropriate interventions Outcome: Progressing   Problem: Skin Integrity: Goal: Demonstration of wound healing without infection will improve Outcome: Progressing

## 2023-05-10 NOTE — Progress Notes (Cosign Needed)
Patient is not able to walk the distance required to go the bathroom, or he/she is unable to safely negotiate stairs required to access the bathroom.  A 3in1 BSC will alleviate this problem  

## 2023-05-10 NOTE — Progress Notes (Signed)
  Progress Note    05/10/2023 7:35 AM 1 Day Post-Op  Subjective:  Yvonne [name] is a 67 yo female who is now POD #1 from right BKA. Resting comfortably in bed this morning eating breakfast. She does endorse some phantom pains to her amputated foot but endorses her overall pain is much better. No complaints over night and vitals all remain stable.     Vitals:   05/10/23 0058 05/10/23 0421  BP: 138/79 (!) 148/79  Pulse: 67 68  Resp: 20 20  Temp: 98.1 F (36.7 C) 99.5 F (37.5 C)  SpO2: 95% 95%   Physical Exam: Cardiac:  RRR, Normal S1, S2. No murmurs appreciated.  Lungs:  lungs clear on auscultation throughout, No rales, rhonchi or wheezing to note.  Incisions:  Right BKA with dressing in place. No drainage to note.  Extremities:  Right lower BKA with dressing in place. No complications to note. Left lower extremity with doppler DP/PT pulses.  Abdomen:  Positive bowel sounds throughout. Soft, non tender and non distended.  Neurologic: AAOX4 answers all questions and follows commands appropriately.   CBC    Component Value Date/Time   WBC 10.6 (H) 05/10/2023 0517   RBC 3.02 (L) 05/10/2023 0517   HGB 9.7 (L) 05/10/2023 0517   HCT 29.5 (L) 05/10/2023 0517   PLT 299 05/10/2023 0517   MCV 97.7 05/10/2023 0517   MCH 32.1 05/10/2023 0517   MCHC 32.9 05/10/2023 0517   RDW 13.9 05/10/2023 0517   LYMPHSABS 2.3 05/07/2023 0905   MONOABS 0.6 05/07/2023 0905   EOSABS 0.1 05/07/2023 0905   BASOSABS 0.0 05/07/2023 0905    BMET    Component Value Date/Time   NA 137 05/10/2023 0517   K 3.7 05/10/2023 0517   CL 108 05/10/2023 0517   CO2 23 05/10/2023 0517   GLUCOSE 165 (H) 05/10/2023 0517   BUN 8 05/10/2023 0517   CREATININE 0.53 05/10/2023 0517   CALCIUM 8.7 (L) 05/10/2023 0517   GFRNONAA >60 05/10/2023 0517   GFRAA >60 12/02/2019 1055    INR    Component Value Date/Time   INR 1.2 02/28/2022 1416     Intake/Output Summary (Last 24 hours) at 05/10/2023 0735 Last data  filed at 05/09/2023 2100 Gross per 24 hour  Intake 1280 ml  Output 600 ml  Net 680 ml     Assessment/Plan:  67 y.o. female is s/p Right BKA  1 Day Post-Op   PLAN: Vascular Surgery to do First dressing change this weekend.  Continue ASA 81 gm daily and Xarelto 20 mg daily and Lipitor 20 mg daily. These medications will need to continue upon discharge.  Pain medication PRN Advance diet as tolerated.  Work with PT/OT OOB to chair during the day. Disposition plan to rehab early next week.    DVT prophylaxis:  ASA 81 mg daily and Xarelto 20 mg Daily   Devin Ganaway R [facility] 05/10/2023 7:35 AM

## 2023-05-10 NOTE — Progress Notes (Signed)
[facility]  Daily Progress Note   Subjective  -   Patient sitting in a chair.  Eating well.  No major events.  Her pain is better than her preoperative pain.  Objective Vitals:   05/09/23 2259 05/10/23 0058 05/10/23 0421 05/10/23 0923  BP: 137/77 138/79 (!) 148/79 (!) 157/72  Pulse: 66 67 68 67  Resp: 18 20 20 16   Temp: 98.9 F (37.2 C) 98.1 F (36.7 C) 99.5 F (37.5 C) 98 F (36.7 C)  TempSrc: Oral Oral    SpO2: (!) 83% 95% 95% 99%    Intake/Output Summary (Last 24 hours) at 05/10/2023 1416 Last data filed at 05/09/2023 2100 Gross per 24 hour  Intake 240 ml  Output 300 ml  Net -60 ml    PULM  CTAB CV  RRR VASC  dressing is clean, dry, and intact.  Laboratory CBC    Component Value Date/Time   WBC 10.6 (H) 05/10/2023 0517   HGB 9.7 (L) 05/10/2023 0517   HCT 29.5 (L) 05/10/2023 0517   PLT 299 05/10/2023 0517    BMET    Component Value Date/Time   NA 137 05/10/2023 0517   K 3.7 05/10/2023 0517   CL 108 05/10/2023 0517   CO2 23 05/10/2023 0517   GLUCOSE 165 (H) 05/10/2023 0517   BUN 8 05/10/2023 0517   CREATININE 0.53 05/10/2023 0517   CALCIUM 8.7 (L) 05/10/2023 0517   GFRNONAA >60 05/10/2023 0517   GFRAA >60 12/02/2019 1055    Assessment/Planning: POD #1 s/p right below-knee amputation  Doing well.  Worked with physical therapy today. Pain is better than her ischemic pain Dressing can come off on Sunday Consider rehab at discharge likely on Sunday or Monday Continue physical therapy and Occupational Therapy    Yvonne [name]  05/10/2023, 2:16 PM

## 2023-05-11 LAB — CBC
HCT: 30.2 % — ABNORMAL LOW (ref 36.0–46.0)
Hemoglobin: 9.8 g/dL — ABNORMAL LOW (ref 12.0–15.0)
MCH: 31.7 pg (ref 26.0–34.0)
MCHC: 32.5 g/dL (ref 30.0–36.0)
MCV: 97.7 fL (ref 80.0–100.0)
Platelets: 298 10*3/uL (ref 150–400)
RBC: 3.09 MIL/uL — ABNORMAL LOW (ref 3.87–5.11)
RDW: 13.8 % (ref 11.5–15.5)
WBC: 9.7 10*3/uL (ref 4.0–10.5)
nRBC: 0 % (ref 0.0–0.2)

## 2023-05-11 LAB — BASIC METABOLIC PANEL
Anion gap: 8 (ref 5–15)
BUN: 8 mg/dL (ref 8–23)
CO2: 24 mmol/L (ref 22–32)
Calcium: 8.8 mg/dL — ABNORMAL LOW (ref 8.9–10.3)
Chloride: 104 mmol/L (ref 98–111)
Creatinine, Ser: 0.51 mg/dL (ref 0.44–1.00)
GFR, Estimated: >60 mL/min (ref >60)
Glucose, Bld: 111 mg/dL — ABNORMAL HIGH (ref 70–99)
Potassium: 3.4 mmol/L — ABNORMAL LOW (ref 3.5–5.1)
Sodium: 136 mmol/L (ref 135–145)

## 2023-05-11 MED ORDER — FAMOTIDINE 20 MG PO TABS
20.0000 mg | ORAL_TABLET | Freq: Two times a day (BID) | ORAL | Status: DC
  Administered 2023-05-11 – 2023-05-13 (×5): 20 mg via ORAL
  Filled 2023-05-11 (×5): qty 1

## 2023-05-11 NOTE — Progress Notes (Signed)
[facility]  Daily Progress Note   Subjective  -   POD#3 from BKA.  Pain is better than pre-op and improved from immediate and yesterday's post-op pain.  No complaints.  Eating and drinking well.  Objective Vitals:   05/10/23 0923 05/10/23 1811 05/10/23 2323 05/11/23 0904  BP: (!) 157/72 (!) 163/73 (!) 142/71 (!) 158/79  Pulse: 67 78 71 70  Resp: 16 18 18 17   Temp: 98 F (36.7 C)  99.3 F (37.4 C) 98 F (36.7 C)  TempSrc:   Oral   SpO2: 99% 95% 96% 96%    Intake/Output Summary (Last 24 hours) at 05/11/2023 1352 Last data filed at 05/11/2023 0531 Gross per 24 hour  Intake 1859.37 ml  Output --  Net 1859.37 ml    PULM  CTAB CV  RRR VASC  Palpable femoral pulses.  BKA dressing is intact with no drainage.  Laboratory CBC    Component Value Date/Time   WBC 9.7 05/11/2023 0407   HGB 9.8 (L) 05/11/2023 0407   HCT 30.2 (L) 05/11/2023 0407   PLT 298 05/11/2023 0407    BMET    Component Value Date/Time   NA 136 05/11/2023 0407   K 3.4 (L) 05/11/2023 0407   CL 104 05/11/2023 0407   CO2 24 05/11/2023 0407   GLUCOSE 111 (H) 05/11/2023 0407   BUN 8 05/11/2023 0407   CREATININE 0.51 05/11/2023 0407   CALCIUM 8.8 (L) 05/11/2023 0407   GFRNONAA >60 05/11/2023 0407   GFRAA >60 12/02/2019 1055    Assessment/Planning: POD #3 s/p BKA  No medical issues today.  IVF discontinued on diet.  Potassium OK yesterday.  Borderline low today.  Will follow. Wheelchair order placed for discharge.  Iline Oven  05/11/2023, 1:52 PM

## 2023-05-11 NOTE — Progress Notes (Signed)
    Durable Medical Equipment  (From admission, onward)           Start     Ordered   05/11/23 1217  For home use only DME high strength lightweight manual wheelchair with seat cushion  Once       Comments: Patient suffers from recent leg amputation which impairs their ability to perform daily activities like bathing, dressing, feeding, grooming, and toileting in the home.  A cane, crutch, or walker will not resolve  issue with performing activities of daily living. A wheelchair will allow patient to safely perform daily activities.Length of need Lifetime. (THEN ONE OF THESE TWO:) Patient self-propels the wheelchair while engaging in frequent activities such as laundry, meals, and toileting which cannot be performed in a standard or lightweight wheelchair due to the weight of the chair. Accessories: elevating leg rests (ELRs), wheel locks, extensions and anti-tippers.   05/11/23 1218   05/10/23 1712  For home use only DME Walker rolling  Once       Question Answer Comment  Walker: With 5 Inch Wheels   Patient needs a walker to treat with the following condition Impaired mobility      05/10/23 1711   05/10/23 1712  For home use only DME Bedside commode  Once       Question:  Patient needs a bedside commode to treat with the following condition  Answer:  Impaired mobility   05/10/23 1711

## 2023-05-11 NOTE — TOC Progression Note (Signed)
Transition of Care [facility]) - Progression Note    Patient Details  Name: Yvonne [name] MRN: [id number] Date of Birth: [birth year]  Transition of Care [facility]) CM/SW Contact  Rodney Langton, RN Phone Number: 05/11/2023, 11:10 AM  Clinical Narrative:     Noted that PT recommended wheelchair for patient.  Called [employer], they will have it delivered, requested MD to place order.   Expected Discharge Plan: Home w Home Health Services Barriers to Discharge: Continued Medical Work up  Expected Discharge Plan and Services   Discharge Planning Services: CM Consult   Living arrangements for the past 2 months: Single Family Home                 DME Arranged: Walker rolling, 3-N-1 DME Agency: [employer] Date DME Agency Contacted: 05/10/23 Time DME Agency Contacted: 23 Representative spoke with at DME Agency: Clet[facility] [place] HH Arranged: PT, OT HH Agency: [facility] Date [facility] Agency Contacted: 05/10/23 Time HH Agency Contacted: 1709 Representative spoke with at [facility] Agency: [place]   Social Determinants of Health (SDOH) Interventions SDOH Screenings   Food Insecurity: No Food Insecurity (05/09/2023)  Recent Concern: Food Insecurity - Food Insecurity Present (02/18/2023)  Housing: Unknown (05/09/2023)  Transportation Needs: No Transportation Needs (05/09/2023)  Utilities: Not At Risk (05/09/2023)  Tobacco Use: Medium Risk (05/09/2023)    Readmission Risk Interventions     No data to display

## 2023-05-11 NOTE — Plan of Care (Signed)
  Problem: Education: Goal: Knowledge of General Education information will improve Description: Including pain rating scale, medication(s)/side effects and non-pharmacologic comfort measures Outcome: Progressing   Problem: Nutrition: Goal: Adequate nutrition will be maintained Outcome: Progressing   Problem: Safety: Goal: Ability to remain free from injury will improve Outcome: Progressing   

## 2023-05-11 NOTE — Progress Notes (Addendum)
Physical Therapy Treatment Patient Details Name: Yvonne [name] MRN: [id number] DOB: 04-Jun-1955 Today's Date: 05/11/2023   History of Present Illness Pt is a 67 y.o. female presenting with severe peripheral arterial disease with rest pain and gangrenous changes to R foot; h/o multiple previous revascularization procedures.  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  PMH includes htn, PVD, syncope, peripheral vascular thrombectomy, PSVT.    PT Comments  Pt was long sitting in bed upon arrival. She endorses 5/10 pain that increased to 6/10 pain with activity. Pt is A and O and agreeable to session. She requested to use BSC to urinate. She was safely able to get to [facility] + successfully urinate prior to standing and hopping ~ 8 ft to recliner. Gait distances limited by pain and pt's breakfast. Author issued pt HEP and educated pt on importance of positioning and stretching. Pt states understanding but will need HEP reviewed in future sessions. Pt will benefit from light wt w/c at DC for longer community distances. She has personal RW and BSC in room already. Acute PT will continue to follow and progress per current POC.     If plan is discharge home, recommend the following: A little help with walking and/or transfers;A little help with bathing/dressing/bathroom;Assistance with cooking/housework;Assist for transportation;Help with stairs or ramp for entrance     Equipment Recommendations  Wheelchair (measurements PT);Wheelchair cushion (measurements PT) (pt has recieved BSC and RW however needs WC for community distances)       Precautions / Restrictions Precautions Precautions: Fall Precaution Comments: R BKA Restrictions Weight Bearing Restrictions Per Provider Order: Yes RLE Weight Bearing Per Provider Order: Non weight bearing     Mobility  Bed Mobility Overal bed mobility: Needs Assistance Bed Mobility: Supine to Sit  Supine to sit: Supervision  General bed mobility comments: no physical assisance required  to achieve EOB short sit    Transfers Overall transfer level: Needs assistance Equipment used: Rolling walker (2 wheels), None Transfers: Sit to/from Stand, Bed to chair/wheelchair/BSC Sit to Stand: Supervision  Squat pivot transfers: Supervision  General transfer comment: Pt requested to get on BSC to urinate. she did not want to use AD and was able to squat pivot to BSC from EOB without assistance. Encouraged pt to use RW and stand fully upright during all transfers. she stood from [facility] after urinating and hopped ~ 8 ft with RW.    Ambulation/Gait Ambulation/Gait assistance: Supervision Gait Distance (Feet): 8 Feet Assistive device: Rolling walker (2 wheels) Gait Pattern/deviations: Step-to pattern Gait velocity: decreased  General Gait Details: Pt was able to hop ~ 8 ft with RW. No LOB. distance limited by pain but overall tolerates well. Recommend WC for community distances   Balance Overall balance assessment: Needs assistance Sitting-balance support: No upper extremity supported, Feet supported, Feet unsupported Sitting balance-Leahy Scale: Good     Standing balance support: Bilateral upper extremity supported, During functional activity, Reliant on assistive device for balance Standing balance-Leahy Scale: Good    Cognition Arousal: Alert Behavior During Therapy: WFL for tasks assessed/performed Overall Cognitive Status: Within Functional Limits for tasks assessed    General Comments: Pt is A and O and cooperative throughout           General Comments General comments (skin integrity, edema, etc.): Chartered loss adjuster issued HEP handout however pt's breakfast arrived and pt requested to wait to peform later. Did discuss importance of positioning to prevent tightness/spasticity. RN in room at conclusion of session.      Pertinent Vitals/Pain Pain Assessment  Pain Assessment: 0-10 Pain Score: 6  Pain Location: R LE Pain Descriptors / Indicators: Discomfort Pain Intervention(s):  Monitored during session, Limited activity within patient's tolerance, Repositioned, Patient requesting pain meds-RN notified     PT Goals (current goals can now be found in the care plan section) Acute Rehab PT Goals Patient Stated Goal: none stated Progress towards PT goals: Progressing toward goals    Frequency    7X/week           Co-evaluation     PT goals addressed during session: Mobility/safety with mobility;Balance;Proper use of DME;Strengthening/ROM        AM-PAC PT "6 Clicks" Mobility   Outcome Measure  Help needed turning from your back to your side while in a flat bed without using bedrails?: None Help needed moving from lying on your back to sitting on the side of a flat bed without using bedrails?: None Help needed moving to and from a bed to a chair (including a wheelchair)?: None Help needed standing up from a chair using your arms (e.g., wheelchair or bedside chair)?: A Little Help needed to walk in hospital room?: A Little Help needed climbing 3-5 steps with a railing? : A Little 6 Click Score: 21    End of Session   Activity Tolerance: Patient tolerated treatment well;Patient limited by pain Patient left: in chair;with call bell/phone within reach;with chair alarm set Nurse Communication: Mobility status;Precautions;Weight bearing status PT Visit Diagnosis: Other abnormalities of gait and mobility (R26.89);Muscle weakness (generalized) (M62.81);Pain Pain - Right/Left: Right Pain - part of body: Leg     Time: 1191-4782 PT Time Calculation (min) (ACUTE ONLY): 15 min  Charges:    $Therapeutic Activity: 8-22 mins PT General Charges $$ ACUTE PT VISIT: 1 Visit                     Jetta Lout PTA 05/11/23, 8:49 AM

## 2023-05-11 NOTE — Plan of Care (Signed)
  Problem: Education: Goal: Knowledge of General Education information will improve Description: Including pain rating scale, medication(s)/side effects and non-pharmacologic comfort measures Outcome: Progressing   Problem: Health Behavior/Discharge Planning: Goal: Ability to manage health-related needs will improve Outcome: Progressing   Problem: Clinical Measurements: Goal: Ability to maintain clinical measurements within normal limits will improve Outcome: Progressing Goal: Will remain free from infection Outcome: Progressing Goal: Diagnostic test results will improve Outcome: Progressing Goal: Respiratory complications will improve Outcome: Progressing Goal: Cardiovascular complication will be avoided Outcome: Progressing   Problem: Activity: Goal: Risk for activity intolerance will decrease Outcome: Progressing   Problem: Nutrition: Goal: Adequate nutrition will be maintained Outcome: Progressing   Problem: Coping: Goal: Level of anxiety will decrease Outcome: Progressing   Problem: Elimination: Goal: Will not experience complications related to bowel motility Outcome: Progressing Goal: Will not experience complications related to urinary retention Outcome: Progressing   Problem: Pain Management: Goal: General experience of comfort will improve Outcome: Progressing   Problem: Safety: Goal: Ability to remain free from injury will improve Outcome: Progressing   Problem: Skin Integrity: Goal: Risk for impaired skin integrity will decrease Outcome: Progressing   Problem: Education: Goal: Knowledge of the prescribed therapeutic regimen will improve Outcome: Progressing Goal: Ability to verbalize activity precautions or restrictions will improve Outcome: Progressing Goal: Understanding of discharge needs will improve Outcome: Progressing   Problem: Activity: Goal: Ability to perform//tolerate increased activity and mobilize with assistive devices will  improve Outcome: Progressing   Problem: Clinical Measurements: Goal: Postoperative complications will be avoided or minimized Outcome: Progressing   Problem: Self-Care: Goal: Ability to meet self-care needs will improve Outcome: Progressing   Problem: Self-Concept: Goal: Ability to maintain and perform role responsibilities to the fullest extent possible will improve Outcome: Progressing   Problem: Pain Management: Goal: Pain level will decrease with appropriate interventions Outcome: Progressing   Problem: Skin Integrity: Goal: Demonstration of wound healing without infection will improve Outcome: Progressing

## 2023-05-12 NOTE — Progress Notes (Signed)
Physical Therapy Treatment Patient Details Name: Yvonne [name] MRN: [id number] DOB: October 27, 1955 Today's Date: 05/12/2023   History of Present Illness Pt is a 67 y.o. female presenting with severe peripheral arterial disease with rest pain and gangrenous changes to R foot; h/o multiple previous revascularization procedures.  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  PMH includes htn, PVD, syncope, peripheral vascular thrombectomy, PSVT.    PT Comments  Pt ready for session.  She is able to get to EOB without assist, steady in sitting.  Stands to RW with cues for hand placements and is able to hop to recliner at bedside with RW and cga/supervision.  Wheelchair obtained for training.  Orientated to features with focus on safety.  She is able to transfer in wheelchair with RW and supervision.  Navigated x 1 lap on unit with focus on turns left/right and backwards navigation.  She is able to navigate back in room and practice squat pivot transfers with supervision.  Taken to hallway for gait 64' with RW and generally steady gait limited by arm fatigue which is reasonable given amount of activity prior.  She is able to navigate back to room in wheelchair and position chair with min a x 1 and squat pivot back in recliner where she remains up with needs met.  A wheelchair would be appropriate for home as she has trouble managing tasks at home with new amputation.  Discussed discharge plan with pt and briefly with son.  He voiced concerns over home before leaving room for session.  Pt demonstrated good mobility and does feels she can manage at home as she was NWB prior and "used to moving this way".  She is encouraged to talk with son when he returns about his ability to stay with her a few days initially or have other local family support her.  Overall progressing well.     Patient suffers from amputation which impairs his/her ability to perform daily activities like toileting, feeding, dressing, grooming, bathing in the home.  A cane, walker, crutch will not resolve the patient's issue with performing activities of daily living. A lightweight wheelchair and cushion is required/recommended and will allow patient to safely perform daily activities.   Patient can safely propel the wheelchair in the home or has a caregiver who can provide assistance.     If plan is discharge home, recommend the following: A little help with walking and/or transfers;A little help with bathing/dressing/bathroom;Assistance with cooking/housework;Assist for transportation;Help with stairs or ramp for entrance   Can travel by private vehicle        Equipment Recommendations  Wheelchair (measurements PT);Wheelchair cushion (measurements PT)    Recommendations for Other Services       Precautions / Restrictions Precautions Precautions: Fall Precaution Comments: R BKA Restrictions Weight Bearing Restrictions Per Provider Order: Yes RLE Weight Bearing Per Provider Order: Non weight bearing     Mobility  Bed Mobility Overal bed mobility: Modified Independent               Patient Response: Cooperative  Transfers Overall transfer level: Needs assistance Equipment used: Rolling walker (2 wheels), None Transfers: Sit to/from Stand, Bed to chair/wheelchair/BSC Sit to Stand: Supervision   Step pivot transfers: Supervision Squat pivot transfers: Supervision          Ambulation/Gait Ambulation/Gait assistance: Supervision Gait Distance (Feet): 40 Feet Assistive device: Rolling walker (2 wheels) Gait Pattern/deviations: Step-to pattern Gait velocity: decreased     General Gait Details: generally steady.  limited by arm  fatigue   Psychologist, counselling mobility: Yes Wheelchair propulsion: Both upper extremities Wheelchair parts: Supervision/cueing Distance: 200' with turns left/right and backwards Wheelchair Assistance Details (indicate cue type and reason):  cues for wc safety, parts, and use   Tilt Bed Tilt Bed Patient Response: Cooperative  Modified Rankin (Stroke Patients Only)       Balance Overall balance assessment: Needs assistance Sitting-balance support: Feet supported Sitting balance-Leahy Scale: Good     Standing balance support: Bilateral upper extremity supported, During functional activity, Reliant on assistive device for balance Standing balance-Leahy Scale: Good                              Cognition Arousal: Alert Behavior During Therapy: WFL for tasks assessed/performed Overall Cognitive Status: Within Functional Limits for tasks assessed                                 General Comments: Pt is A and O and cooperative throughout        Exercises      General Comments        Pertinent Vitals/Pain Pain Assessment Pain Assessment: Faces Faces Pain Scale: Hurts little more Pain Location: R LE Pain Descriptors / Indicators: Discomfort Pain Intervention(s): Limited activity within patient's tolerance, Monitored during session, Repositioned    Home Living                          Prior Function            PT Goals (current goals can now be found in the care plan section) Progress towards PT goals: Progressing toward goals    Frequency    7X/week      PT Plan      Co-evaluation              AM-PAC PT "6 Clicks" Mobility   Outcome Measure  Help needed turning from your back to your side while in a flat bed without using bedrails?: None Help needed moving from lying on your back to sitting on the side of a flat bed without using bedrails?: None Help needed moving to and from a bed to a chair (including a wheelchair)?: None Help needed standing up from a chair using your arms (e.g., wheelchair or bedside chair)?: None Help needed to walk in hospital room?: A Little Help needed climbing 3-5 steps with a railing? : A Little 6 Click Score: 22     End of Session Equipment Utilized During Treatment: Gait belt Activity Tolerance: Patient tolerated treatment well Patient left: in chair;with call bell/phone within reach;with chair alarm set Nurse Communication: Mobility status;Precautions;Weight bearing status PT Visit Diagnosis: Other abnormalities of gait and mobility (R26.89);Muscle weakness (generalized) (M62.81);Pain Pain - Right/Left: Right Pain - part of body: Leg     Time: 7425-9563 PT Time Calculation (min) (ACUTE ONLY): 29 min  Charges:    $Therapeutic Activity: 23-37 mins PT General Charges $$ ACUTE PT VISIT: 1 Visit                   [name], PTA 05/12/23, 3:57 PM

## 2023-05-12 NOTE — Plan of Care (Signed)
  Problem: Education: Goal: Knowledge of General Education information will improve Description: Including pain rating scale, medication(s)/side effects and non-pharmacologic comfort measures Outcome: Progressing   Problem: Activity: Goal: Risk for activity intolerance will decrease Outcome: Progressing   Problem: Nutrition: Goal: Adequate nutrition will be maintained Outcome: Progressing   Problem: Pain Management: Goal: General experience of comfort will improve Outcome: Progressing   Problem: Safety: Goal: Ability to remain free from injury will improve Outcome: Progressing   Problem: Skin Integrity: Goal: Risk for impaired skin integrity will decrease Outcome: Progressing

## 2023-05-12 NOTE — Progress Notes (Signed)
[facility]  Daily Progress Note   Subjective  -   Feels great.  No complaints.  Minomal pain.  Objective Vitals:   05/11/23 1628 05/11/23 2346 05/12/23 0753 05/12/23 1600  BP: (!) 140/77 (!) 156/79 (!) 146/77 103/62  Pulse: 69 74 76 68  Resp: 16 20 16 16   Temp: 98 F (36.7 C) 98.7 F (37.1 C) 98.6 F (37 C) 97.6 F (36.4 C)  TempSrc:   Oral Oral  SpO2: 96% 94% 96% 96%    Intake/Output Summary (Last 24 hours) at 05/12/2023 1608 Last data filed at 05/11/2023 1930 Gross per 24 hour  Intake 200 ml  Output --  Net 200 ml    PULM  CTAB CV  RRR VASC  Femoral pulses.  Clean dressing.  Laboratory CBC    Component Value Date/Time   WBC 9.7 05/11/2023 0407   HGB 9.8 (L) 05/11/2023 0407   HCT 30.2 (L) 05/11/2023 0407   PLT 298 05/11/2023 0407    BMET    Component Value Date/Time   NA 136 05/11/2023 0407   K 3.4 (L) 05/11/2023 0407   CL 104 05/11/2023 0407   CO2 24 05/11/2023 0407   GLUCOSE 111 (H) 05/11/2023 0407   BUN 8 05/11/2023 0407   CREATININE 0.51 05/11/2023 0407   CALCIUM 8.8 (L) 05/11/2023 0407   GFRNONAA >60 05/11/2023 0407   GFRAA >60 12/02/2019 1055    Assessment/Planning: POD #3 s/p BKA  She was eating and had family visiting so dressing change is delayed until Monday.  Wheelchair ordered.  Ready for Rehab.  Iline Oven  05/12/2023, 4:08 PM

## 2023-05-13 LAB — SURGICAL PATHOLOGY

## 2023-05-13 NOTE — Progress Notes (Signed)
PT Cancellation Note  Patient Details Name: Yvonne [name] MRN: [id number] DOB: [birth year]   Cancelled Treatment:    Reason Eval/Treat Not Completed: Patient declined, no reason specified (Treatment session re-attempted.  Patient appears much more comfortable and relaxed; however, multiple family members at bedside, currently eating lunch.  Requests therapist allow visit/lunch and re-attempt next date)  Shanta Hartner H. Manson Passey, PT, DPT, NCS 05/13/23, 3:45 PM [phone]

## 2023-05-13 NOTE — Plan of Care (Signed)
  Problem: Education: Goal: Knowledge of General Education information will improve Description: Including pain rating scale, medication(s)/side effects and non-pharmacologic comfort measures Outcome: Progressing   Problem: Health Behavior/Discharge Planning: Goal: Ability to manage health-related needs will improve Outcome: Progressing   Problem: Clinical Measurements: Goal: Ability to maintain clinical measurements within normal limits will improve Outcome: Progressing Goal: Will remain free from infection Outcome: Progressing   Problem: Activity: Goal: Risk for activity intolerance will decrease Outcome: Progressing   Problem: Nutrition: Goal: Adequate nutrition will be maintained Outcome: Progressing   Problem: Coping: Goal: Level of anxiety will decrease Outcome: Progressing   Problem: Pain Management: Goal: General experience of comfort will improve Outcome: Progressing   Problem: Safety: Goal: Ability to remain free from injury will improve Outcome: Progressing

## 2023-05-13 NOTE — Progress Notes (Signed)
PT Cancellation Note  Patient Details Name: Yvonne [name] MRN: [id number] DOB: [birth year]   Cancelled Treatment:    Reason Eval/Treat Not Completed: Pain limiting ability to participate  Attempted x 2 today.  Pt crying this am due to severe pain.  RN notified for pain meds.  Returned later and pt still uncomfortable and did not feel she could participate at this time.  Will try to have another therapist see pt later today.  If not will resume tomorrow.   [name] 05/13/2023, 1:27 PM

## 2023-05-13 NOTE — Progress Notes (Signed)
  Progress Note    05/13/2023 3:39 PM 4 Days Post-Op  Subjective:  Yvonne [name] is a 67 yo female who is now POD#4 from right BKA.  Patient is resting comfortably in bed this afternoon.  Dressing changes completed at the bedside today without complication.  There were no signs or symptoms of infection hematoma seroma to note.  Patient's vitals are remained stable.  Discharge plan is home tomorrow with home health.   Vitals:   05/12/23 2216 05/13/23 0755  BP: 129/69 (!) 150/82  Pulse: 78 74  Resp: 18 16  Temp: 97.9 F (36.6 C) 98 F (36.7 C)  SpO2: 92% 96%   Physical Exam: Cardiac:  RRR, Normal S1, S2. No murmurs appreciated.  Lungs:  lungs clear on auscultation throughout, No rales, rhonchi or wheezing to note.  Incisions:  Right BKA with dressing in place. No drainage to note.  Extremities:  Right lower BKA with dressing in place. No complications to note. Left lower extremity with doppler DP/PT pulses.  Abdomen:  Positive bowel sounds throughout. Soft, non tender and non distended.  Neurologic: AAOX4 answers all questions and follows commands appropriately.    CBC    Component Value Date/Time   WBC 9.7 05/11/2023 0407   RBC 3.09 (L) 05/11/2023 0407   HGB 9.8 (L) 05/11/2023 0407   HCT 30.2 (L) 05/11/2023 0407   PLT 298 05/11/2023 0407   MCV 97.7 05/11/2023 0407   MCH 31.7 05/11/2023 0407   MCHC 32.5 05/11/2023 0407   RDW 13.8 05/11/2023 0407   LYMPHSABS 2.3 05/07/2023 0905   MONOABS 0.6 05/07/2023 0905   EOSABS 0.1 05/07/2023 0905   BASOSABS 0.0 05/07/2023 0905    BMET    Component Value Date/Time   NA 136 05/11/2023 0407   K 3.4 (L) 05/11/2023 0407   CL 104 05/11/2023 0407   CO2 24 05/11/2023 0407   GLUCOSE 111 (H) 05/11/2023 0407   BUN 8 05/11/2023 0407   CREATININE 0.51 05/11/2023 0407   CALCIUM 8.8 (L) 05/11/2023 0407   GFRNONAA >60 05/11/2023 0407   GFRAA >60 12/02/2019 1055    INR    Component Value Date/Time   INR 1.2 02/28/2022 1416    No  intake or output data in the 24 hours ending 05/13/23 1539   Assessment/Plan:  67 y.o. female is s/p right below the knee amputation.  4 Days Post-Op   PLAN: Patient's right below-knee amputation dressing was changed today.  Staples remain intact clean and dry.  No hematoma seroma to note. Dressing changes daily until staples are removed on return visit 3 weeks from today. Discharge plan is home tomorrow with home health for dressing changes. Patient to continue Xarelto 20 mg daily, ASA 81 MG daily and Lipitor 20 MG Daily.  DVT prophylaxis:  Xarelto 20 mg Daily, ASA 81 mg Daily   Yvonne [name] [facility] 05/13/2023 3:39 PM

## 2023-05-13 NOTE — TOC Progression Note (Signed)
Transition of Care [facility]) - Progression Note    Patient Details  Name: Yvonne [name] MRN: [id number] Date of Birth: [birth year]  Transition of Care [facility]) CM/SW Contact  Marlowe Sax, RN Phone Number: 05/13/2023, 11:48 AM  Clinical Narrative:    Met with the patient to confirm she has everything she needs,she has a manual wheelchair at home, [employer] delivered the 3 in1 and the RW to the bedside, she said if she needs a different wheelchair she will go to the Hospice outlet and get one, her family will provide transportation home at [place]   Expected Discharge Plan: Home w Home Health Services Barriers to Discharge: Continued Medical Work up  Expected Discharge Plan and Services   Discharge Planning Services: CM Consult   Living arrangements for the past 2 months: Single Family Home                 DME Arranged: Dan Humphreys rolling, 3-N-1 DME Agency: [employer] Date DME Agency Contacted: 05/10/23 Time DME Agency Contacted: 16 Representative spoke with at DME Agency: Cletis [place] HH Arranged: PT, OT [facility] Agency: [facility] Date [facility] Agency Contacted: 05/10/23 Time HH Agency Contacted: 1709 Representative spoke with at [facility] Agency: [place]   Social Determinants of Health (SDOH) Interventions SDOH Screenings   Food Insecurity: No Food Insecurity (05/09/2023)  Recent Concern: Food Insecurity - Food Insecurity Present (02/18/2023)  Housing: Unknown (05/09/2023)  Transportation Needs: No Transportation Needs (05/09/2023)  Utilities: Not At Risk (05/09/2023)  Tobacco Use: Medium Risk (05/09/2023)    Readmission Risk Interventions     No data to display

## 2023-05-13 NOTE — Discharge Instructions (Addendum)
[facility].  [address] , [place], [place], [address]. [phone] They will call you to arrange when they can come to see you   VASCULAR SURGERY INSTRUCTIONS  At all cost do not fall in your stump if you do please call vein and vascular office and schedule an appointment to be seen as soon as possible.  You may shower with the old dressing in place.  As soon as you are finished showering you must immediately change to a clean new dressing.  You may allow soap and water to run over your staples that are intact but pat dry immediately after getting out of the shower.  Do not submerge your stump in any type of water.  Do not take a bath or get into a hot tub.  Do not drive.  Do not lift anything heavy for the next 3 weeks until your staples are removed.  Take all medications as prescribed.  You will need to continue to take your Xarelto, your aspirin, and your Lipitor.  Follow-up in vein and vascular surgery clinic as scheduled in 3 weeks to have your staples removed.

## 2023-05-13 NOTE — Plan of Care (Signed)
  Problem: Education: Goal: Knowledge of General Education information will improve Description: Including pain rating scale, medication(s)/side effects and non-pharmacologic comfort measures Outcome: Progressing   Problem: Nutrition: Goal: Adequate nutrition will be maintained Outcome: Progressing   Problem: Pain Management: Goal: General experience of comfort will improve Outcome: Progressing   Problem: Safety: Goal: Ability to remain free from injury will improve Outcome: Progressing   Problem: Skin Integrity: Goal: Risk for impaired skin integrity will decrease Outcome: Progressing

## 2023-05-14 NOTE — Discharge Summary (Signed)
Yvonne [name]    Discharge Summary    Patient ID:  Yvonne [name] MRN: [id number] DOB/AGE: 05-30-1955 67 y.o.  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  Patient is healing as expected.  Dressing was changed without complications.  No signs or symptoms of hematoma seroma or infection.  Patient is recovering as expected.  Patient is medically stable for discharge today.  Patient needs to continue her Xarelto 20 mg daily, aspirin 81 mg daily and her Lipitor 20 mg daily as treatment for her continued peripheral arterial disease.  Hospital Course:  Yvonne [name] is a 67 y.o. female is S/P Right Extubated: POD # 0 Physical Exam:  Alert notes x3, no acute distress Face: Symmetrical.  Tongue is midline. Neck: Trachea is midline.  No swelling or bruising. Cardiovascular: Regular rate and rhythm Pulmonary: Clear to auscultation bilaterally Abdomen: Soft, nontender, nondistended Left lower extremity: Thigh soft.  Calf soft.  Extremities warm distally toes.  Hard to palpate pedal pulses however the foot is warm is her good capillary refill. Right lower extremity: Thigh soft.  Calf soft.   Right Below the Knee Amputation Neurological: No deficits noted   Post-op wounds:  clean, dry, intact or healing well  Pt. Ambulating, voiding and taking PO diet without difficulty. Pt pain controlled with PO pain meds.  Labs:  As below  Complications: none  Consults:    Significant Diagnostic Studies: CBC Lab Results  Component Value Date   WBC 9.7 05/11/2023   HGB 9.8 (L) 05/11/2023   HCT 30.2 (L) 05/11/2023   MCV 97.7 05/11/2023   PLT 298 05/11/2023    BMET    Component Value Date/Time   NA 136 05/11/2023 0407   K 3.4 (L) 05/11/2023 0407   CL 104 05/11/2023 0407   CO2 24 05/11/2023 0407   GLUCOSE 111 (H) 05/11/2023 0407   BUN 8 05/11/2023 0407   CREATININE 0.51 05/11/2023 0407   CALCIUM 8.8 (L) 05/11/2023 0407   GFRNONAA >60 05/11/2023 0407   GFRAA >60 12/02/2019 1055   COAG Lab Results  Component Value Date   INR 1.2 02/28/2022   INR 1.2 02/15/2022   INR 1.2 10/27/2021     Disposition:  Discharge to :Home  Allergies as of 05/14/2023   No Known Allergies      Medication List     TAKE these medications    amLODipine 10 MG tablet Commonly known as: NORVASC Take 1 tablet by mouth once daily   aspirin EC 81 MG tablet Take 81 mg by mouth daily. Swallow whole.   atorvastatin 20 MG tablet Commonly known as: LIPITOR Take 20 mg by mouth daily.   cholecalciferol 25 MCG (1000 UNIT) tablet  Commonly known as: VITAMIN D3 Take 1,000 Units by mouth daily.   diphenhydramine-acetaminophen 25-500 MG Tabs tablet Commonly known as: TYLENOL PM Take 1 tablet by mouth at bedtime as needed.   metoprolol tartrate 25 MG tablet Commonly known as: LOPRESSOR Take 1 tablet (25 mg total) by mouth 2 (two) times daily.   oxyCODONE-acetaminophen 5-325 MG tablet Commonly known as: PERCOCET/ROXICET Take 1 tablet by mouth every 4 (four) hours as needed for severe pain (pain score 7-10).   rivaroxaban 20 MG Tabs tablet Commonly known as: XARELTO Take 1  tablet (20 mg total) by mouth daily.               Durable Medical Equipment  (From admission, onward)           Start     Ordered   05/11/23 1217  For home use only DME high strength lightweight manual wheelchair with seat cushion  Once       Comments: Patient suffers from recent leg amputation which impairs their ability to perform daily activities like bathing, dressing, feeding, grooming, and toileting in the home.  A cane, crutch, or walker will not resolve  issue with performing activities of daily living. A wheelchair will allow patient to safely perform daily activities.Length of need Lifetime. (THEN ONE OF THESE TWO:) Patient self-propels the wheelchair while engaging in frequent activities such as laundry, meals, and toileting which cannot be performed in a standard or lightweight wheelchair due to the weight of the chair. Accessories: elevating leg rests (ELRs), wheel locks, extensions and anti-tippers.   05/11/23 1218   05/10/23 1712  For home use only DME Walker rolling  Once       Question Answer Comment  Walker: With 5 Inch Wheels   Patient needs a walker to treat with the following condition Impaired mobility      05/10/23 1711   05/10/23 1712  For home use only DME Bedside commode  Once       Question:  Patient needs a bedside commode to treat with the following condition  Answer:  Impaired mobility   05/10/23 1711           Verbal and written Discharge instructions given to the patient. Wound care per Discharge AVS  Follow-up Information     Georgiana Spinner, NP Follow up in 3 week(s).   Specialty: Vascular Surgery Why: Staple Removal. Contact information: [address] [place] [address] [phone]                 Signed: Marcie Bal, NP  05/14/2023, 7:59 AM

## 2023-05-14 NOTE — Plan of Care (Signed)
   Problem: Clinical Measurements: Goal: Will remain free from infection Outcome: Progressing Goal: Diagnostic test results will improve Outcome: Progressing   Problem: Nutrition: Goal: Adequate nutrition will be maintained Outcome: Progressing   Problem: Elimination: Goal: Will not experience complications related to bowel motility Outcome: Progressing Goal: Will not experience complications related to urinary retention Outcome: Progressing

## 2023-05-15 DIAGNOSIS — E782 Mixed hyperlipidemia: Secondary | ICD-10-CM | POA: Diagnosis not present

## 2023-05-15 DIAGNOSIS — Z4781 Encounter for orthopedic aftercare following surgical amputation: Secondary | ICD-10-CM | POA: Diagnosis not present

## 2023-05-15 DIAGNOSIS — I10 Essential (primary) hypertension: Secondary | ICD-10-CM | POA: Diagnosis not present

## 2023-05-15 DIAGNOSIS — Z89511 Acquired absence of right leg below knee: Secondary | ICD-10-CM | POA: Diagnosis not present

## 2023-05-15 DIAGNOSIS — Z7901 Long term (current) use of anticoagulants: Secondary | ICD-10-CM | POA: Diagnosis not present

## 2023-05-15 DIAGNOSIS — I6523 Occlusion and stenosis of bilateral carotid arteries: Secondary | ICD-10-CM | POA: Diagnosis not present

## 2023-05-15 DIAGNOSIS — Z7982 Long term (current) use of aspirin: Secondary | ICD-10-CM | POA: Diagnosis not present

## 2023-05-15 DIAGNOSIS — I70263 Atherosclerosis of native arteries of extremities with gangrene, bilateral legs: Secondary | ICD-10-CM | POA: Diagnosis not present

## 2023-05-15 DIAGNOSIS — Z87891 Personal history of nicotine dependence: Secondary | ICD-10-CM | POA: Diagnosis not present

## 2023-05-15 DIAGNOSIS — I471 Supraventricular tachycardia, unspecified: Secondary | ICD-10-CM | POA: Diagnosis not present

## 2023-05-17 ENCOUNTER — Telehealth (INDEPENDENT_AMBULATORY_CARE_PROVIDER_SITE_OTHER): Payer: Self-pay

## 2023-05-17 NOTE — Telephone Encounter (Signed)
Yvonne [name] called for verbal orders pertaining to Yvonne [name]'s right BKA she had on 05/09/23. She started home health 05/15/23 for wound care monitoring. She is asking for 1 time a week for 6 weeks.   We're able to leave the message on her secure voicemail.

## 2023-05-17 NOTE — Telephone Encounter (Signed)
Message left on secure voicemail

## 2023-05-17 NOTE — Telephone Encounter (Signed)
That is fine 

## 2023-05-28 DIAGNOSIS — I70263 Atherosclerosis of native arteries of extremities with gangrene, bilateral legs: Secondary | ICD-10-CM | POA: Diagnosis not present

## 2023-05-28 DIAGNOSIS — Z89511 Acquired absence of right leg below knee: Secondary | ICD-10-CM | POA: Diagnosis not present

## 2023-05-28 DIAGNOSIS — I471 Supraventricular tachycardia, unspecified: Secondary | ICD-10-CM | POA: Diagnosis not present

## 2023-05-28 DIAGNOSIS — Z7982 Long term (current) use of aspirin: Secondary | ICD-10-CM | POA: Diagnosis not present

## 2023-05-28 DIAGNOSIS — E782 Mixed hyperlipidemia: Secondary | ICD-10-CM | POA: Diagnosis not present

## 2023-05-28 DIAGNOSIS — Z7901 Long term (current) use of anticoagulants: Secondary | ICD-10-CM | POA: Diagnosis not present

## 2023-05-28 DIAGNOSIS — I10 Essential (primary) hypertension: Secondary | ICD-10-CM | POA: Diagnosis not present

## 2023-05-28 DIAGNOSIS — I6523 Occlusion and stenosis of bilateral carotid arteries: Secondary | ICD-10-CM | POA: Diagnosis not present

## 2023-05-28 DIAGNOSIS — Z4781 Encounter for orthopedic aftercare following surgical amputation: Secondary | ICD-10-CM | POA: Diagnosis not present

## 2023-05-28 DIAGNOSIS — Z87891 Personal history of nicotine dependence: Secondary | ICD-10-CM | POA: Diagnosis not present

## 2023-05-30 ENCOUNTER — Telehealth (INDEPENDENT_AMBULATORY_CARE_PROVIDER_SITE_OTHER): Payer: Self-pay

## 2023-05-30 ENCOUNTER — Other Ambulatory Visit (INDEPENDENT_AMBULATORY_CARE_PROVIDER_SITE_OTHER): Payer: Self-pay | Admitting: Nurse Practitioner

## 2023-05-30 MED ORDER — OXYCODONE-ACETAMINOPHEN 5-325 MG PO TABS: 1.0000 | ORAL_TABLET | ORAL | 0 refills | Status: DC | PRN

## 2023-05-30 NOTE — Telephone Encounter (Signed)
 Patient has been notified

## 2023-05-30 NOTE — Telephone Encounter (Signed)
 Refill sent of percocet

## 2023-05-30 NOTE — Telephone Encounter (Signed)
 Patient left a message informing that she is having pain with right BKA and pain level is around a 5 today. The patient is requesting for pain medication for pain relief. Please Advise

## 2023-06-03 ENCOUNTER — Encounter (INDEPENDENT_AMBULATORY_CARE_PROVIDER_SITE_OTHER): Payer: Self-pay | Admitting: Nurse Practitioner

## 2023-06-03 ENCOUNTER — Ambulatory Visit (INDEPENDENT_AMBULATORY_CARE_PROVIDER_SITE_OTHER): Payer: Medicare HMO | Admitting: Nurse Practitioner

## 2023-06-03 VITALS — BP 127/77 | HR 60 | Resp 16

## 2023-06-03 DIAGNOSIS — Z89511 Acquired absence of right leg below knee: Secondary | ICD-10-CM

## 2023-06-03 MED ORDER — GABAPENTIN 300 MG PO CAPS: 300.0000 mg | ORAL_CAPSULE | Freq: Every day | ORAL | 0 refills | Status: DC

## 2023-06-03 NOTE — Progress Notes (Signed)
 Subjective:    Patient ID: Yvonne [name], female    DOB: May 11, 1956, 68 y.o.   MRN: [id number] Chief Complaint  Patient presents with   Routine Post Op    [facility] 3 week staple removal    The patient is a 68 year old female who returns today for staple removal of her right below-knee amputation.  Amputation was done on 05/09/2023.  The incision itself is doing well with no evidence of drainage.  She notes some tenderness near the lateral areas.  Her largest complaint is of phantom limb pain that bothers her more at night.    Review of Systems  Neurological:  Positive for weakness.  All other systems reviewed and are negative.      Objective:   Physical Exam Vitals reviewed.  HENT:     Head: Normocephalic.  Cardiovascular:     Rate and Rhythm: Normal rate.  Pulmonary:     Effort: Pulmonary effort is normal.  Musculoskeletal:     Right Lower Extremity: Right leg is amputated below knee.  Skin:    General: Skin is warm and dry.  Neurological:     Mental Status: She is alert and oriented to person, place, and time.  Psychiatric:        Mood and Affect: Mood normal.        Behavior: Behavior normal.        Thought Content: Thought content normal.        Judgment: Judgment normal.     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)    Bilateral carotid artery stenosis    Dysrhythmia    Hyperlipidemia    Hypertension    Ischemic leg    Peripheral vascular disease (HCC)    PSVT (paroxysmal supraventricular tachycardia) (HCC)    Syncope     Social History   Socioeconomic History   Marital status: Divorced    Spouse name: Not on file   Number of children: 2   Years of education: Not on file   Highest education level: Not on file  Occupational History   Not on file   Tobacco Use   Smoking status: Former    Current packs/day: 0.00    Types: Cigarettes    Quit date: 10/27/2021    Years since quitting: 1.6   Smokeless tobacco: Never  Vaping Use   Vaping status: Never Used  Substance and Sexual Activity   Alcohol use: Not Currently    Comment: rarely   Drug use: Never   Sexual activity: Not on file  Other Topics Concern   Not on file  Social History Narrative   Lives by herself now.    Social Drivers of Corporate Investment Banker Strain: Not on file  Food Insecurity: No Food Insecurity (05/09/2023)   Hunger Vital Sign    Worried About Running Out of Food in the Last Year: Never true    Ran Out of Food in the Last Year: Never true  Recent Concern: Food Insecurity - Food Insecurity Present (02/18/2023)   Hunger Vital Sign    Worried About Running Out of Food in the Last Year: Sometimes true    Ran Out of Food in the Last Year: Never true  Transportation Needs: No Transportation Needs (05/09/2023)  PRAPARE - Administrator, Civil Service (Medical): No    Lack of Transportation (Non-Medical): No  Physical Activity: Not on file  Stress: Not on file  Social Connections: Not on file  Intimate Partner Violence: Not At Risk (05/09/2023)   Humiliation, Afraid, Rape, and Kick questionnaire    Fear of Current or Ex-Partner: No    Emotionally Abused: No    Physically Abused: No    Sexually Abused: No    Past Surgical History:  Procedure Laterality Date   ABDOMINAL HYSTERECTOMY     AMPUTATION Right 05/09/2023   Procedure: AMPUTATION BELOW KNEE;  Surgeon: Marea Selinda RAMAN, MD;  Location: [facility] ORS;  Service: Vascular;  Laterality: Right;   LOWER EXTREMITY ANGIOGRAPHY Right 10/02/2021   Procedure: Lower Extremity Angiography;  Surgeon: Marea Selinda RAMAN, MD;  Location: [facility];  Service: Cardiovascular;  Laterality: Right;   LOWER EXTREMITY ANGIOGRAPHY Left 10/09/2021   Procedure: Lower Extremity Angiography;  Surgeon: Marea Selinda RAMAN, MD;  Location: [facility];  Service: Cardiovascular;  Laterality: Left;   LOWER EXTREMITY ANGIOGRAPHY Right 10/27/2021   Procedure: Lower Extremity Angiography;  Surgeon: Marea Selinda RAMAN, MD;  Location: [facility];  Service: Cardiovascular;  Laterality: Right;   LOWER EXTREMITY ANGIOGRAPHY Right 02/15/2022   Procedure: Lower Extremity Angiography;  Surgeon: Marea Selinda RAMAN, MD;  Location: [facility];  Service: Cardiovascular;  Laterality: Right;   LOWER EXTREMITY ANGIOGRAPHY Right 02/28/2022   Procedure: Lower Extremity Angiography;  Surgeon: Marea Selinda RAMAN, MD;  Location: [facility];  Service: Cardiovascular;  Laterality: Right;   LOWER EXTREMITY ANGIOGRAPHY Right 02/18/2023   Procedure: Lower Extremity Angiography;  Surgeon: Marea Selinda RAMAN, MD;  Location: [facility];  Service: Cardiovascular;  Laterality: Right;   LOWER EXTREMITY ANGIOGRAPHY Right 02/19/2023   Procedure: Lower Extremity Angiography;  Surgeon: Marea Selinda RAMAN, MD;  Location: [facility];  Service: Cardiovascular;  Laterality: Right;   LOWER EXTREMITY INTERVENTION Right 10/27/2021   Procedure: LOWER EXTREMITY INTERVENTION;  Surgeon: Marea Selinda RAMAN, MD;  Location: [facility];  Service: Cardiovascular;  Laterality: Right;   LOWER EXTREMITY INTERVENTION Right 02/15/2022   Procedure: LOWER EXTREMITY INTERVENTION;  Surgeon: Marea Selinda RAMAN, MD;  Location: [facility];  Service: Cardiovascular;  Laterality: Right;   PARTIAL HYSTERECTOMY     PERIPHERAL VASCULAR THROMBECTOMY Right 03/01/2022   Procedure: PERIPHERAL VASCULAR THROMBECTOMY;  Surgeon: Marea Selinda RAMAN, MD;  Location: [facility];  Service: Cardiovascular;  Laterality: Right;    History reviewed. No pertinent family history.  No Known Allergies     Latest Ref Rng & Units 05/11/2023    4:07 AM 05/10/2023    5:17 AM 05/09/2023   12:04 PM  CBC  WBC 4.0 - 10.5 K/uL 9.7  10.6  6.8   Hemoglobin 12.0 -  15.0 g/dL 9.8  9.7  88.6   Hematocrit 36.0 - 46.0 % 30.2  29.5  35.4   Platelets 150 - 400 K/uL 298  299  374       CMP     Component Value Date/Time   NA 136 05/11/2023 0407   K 3.4 (L) 05/11/2023 0407   CL 104 05/11/2023 0407   CO2 24 05/11/2023 0407   GLUCOSE 111 (H) 05/11/2023 0407   BUN 8 05/11/2023 0407   CREATININE 0.51 05/11/2023 0407   CALCIUM  8.8 (L) 05/11/2023 0407   PROT 7.3 02/28/2022 1929  ALBUMIN 3.2 (L) 02/28/2022 1929   AST 27 02/28/2022 1929   ALT 23 02/28/2022 1929   ALKPHOS 134 (H) 02/28/2022 1929   BILITOT 0.7 02/28/2022 1929   GFRNONAA >60 05/11/2023 0407     No results found.     Assessment & Plan:   1. Hx of BKA, right (HCC) (Primary) Today have the patient staples removed.  She tolerated this fairly well.  Her incision is intact with no evidence of infection.  She is suffering from phantom limb pain and so we have introduced gabapentin  starting on a low dosage.  Will have her return for further staple removal in 1 to 2 weeks.   Current Outpatient Medications on File Prior to Visit  Medication Sig Dispense Refill   amLODipine  (NORVASC ) 10 MG tablet Take 1 tablet by mouth once daily 30 tablet 0   aspirin  EC 81 MG tablet Take 81 mg by mouth daily. Swallow whole.     atorvastatin  (LIPITOR) 20 MG tablet Take 20 mg by mouth daily.     cholecalciferol  (VITAMIN D3) 25 MCG (1000 UNIT) tablet Take 1,000 Units by mouth daily.     diphenhydramine -acetaminophen  (TYLENOL  PM) 25-500 MG TABS tablet Take 1 tablet by mouth at bedtime as needed.     metoprolol  tartrate (LOPRESSOR ) 25 MG tablet Take 1 tablet (25 mg total) by mouth 2 (two) times daily. 60 tablet 3   oxyCODONE -acetaminophen  (PERCOCET/ROXICET) 5-325 MG tablet Take 1 tablet by mouth every 4 (four) hours as needed for severe pain (pain score 7-10). 30 tablet 0   rivaroxaban  (XARELTO ) 20 MG TABS tablet Take 1 tablet (20 mg total) by mouth daily. (Patient not taking: Reported on 05/09/2023) 30 tablet 3    No current facility-administered medications on file prior to visit.    There are no Patient Instructions on file for this visit. No follow-ups on file.   Oliverio Cho E Layden Caterino, NP

## 2023-06-04 DIAGNOSIS — Z7901 Long term (current) use of anticoagulants: Secondary | ICD-10-CM | POA: Diagnosis not present

## 2023-06-04 DIAGNOSIS — Z89511 Acquired absence of right leg below knee: Secondary | ICD-10-CM | POA: Diagnosis not present

## 2023-06-04 DIAGNOSIS — Z87891 Personal history of nicotine dependence: Secondary | ICD-10-CM | POA: Diagnosis not present

## 2023-06-04 DIAGNOSIS — I70263 Atherosclerosis of native arteries of extremities with gangrene, bilateral legs: Secondary | ICD-10-CM | POA: Diagnosis not present

## 2023-06-04 DIAGNOSIS — I471 Supraventricular tachycardia, unspecified: Secondary | ICD-10-CM | POA: Diagnosis not present

## 2023-06-04 DIAGNOSIS — I10 Essential (primary) hypertension: Secondary | ICD-10-CM | POA: Diagnosis not present

## 2023-06-04 DIAGNOSIS — Z4781 Encounter for orthopedic aftercare following surgical amputation: Secondary | ICD-10-CM | POA: Diagnosis not present

## 2023-06-04 DIAGNOSIS — E782 Mixed hyperlipidemia: Secondary | ICD-10-CM | POA: Diagnosis not present

## 2023-06-04 DIAGNOSIS — I6523 Occlusion and stenosis of bilateral carotid arteries: Secondary | ICD-10-CM | POA: Diagnosis not present

## 2023-06-04 DIAGNOSIS — Z7982 Long term (current) use of aspirin: Secondary | ICD-10-CM | POA: Diagnosis not present

## 2023-06-05 DIAGNOSIS — I70263 Atherosclerosis of native arteries of extremities with gangrene, bilateral legs: Secondary | ICD-10-CM | POA: Diagnosis not present

## 2023-06-05 DIAGNOSIS — I471 Supraventricular tachycardia, unspecified: Secondary | ICD-10-CM | POA: Diagnosis not present

## 2023-06-05 DIAGNOSIS — Z89511 Acquired absence of right leg below knee: Secondary | ICD-10-CM | POA: Diagnosis not present

## 2023-06-05 DIAGNOSIS — Z7982 Long term (current) use of aspirin: Secondary | ICD-10-CM | POA: Diagnosis not present

## 2023-06-05 DIAGNOSIS — E782 Mixed hyperlipidemia: Secondary | ICD-10-CM | POA: Diagnosis not present

## 2023-06-05 DIAGNOSIS — Z7901 Long term (current) use of anticoagulants: Secondary | ICD-10-CM | POA: Diagnosis not present

## 2023-06-05 DIAGNOSIS — Z87891 Personal history of nicotine dependence: Secondary | ICD-10-CM | POA: Diagnosis not present

## 2023-06-05 DIAGNOSIS — I6523 Occlusion and stenosis of bilateral carotid arteries: Secondary | ICD-10-CM | POA: Diagnosis not present

## 2023-06-05 DIAGNOSIS — I10 Essential (primary) hypertension: Secondary | ICD-10-CM | POA: Diagnosis not present

## 2023-06-05 DIAGNOSIS — Z4781 Encounter for orthopedic aftercare following surgical amputation: Secondary | ICD-10-CM | POA: Diagnosis not present

## 2023-06-10 ENCOUNTER — Ambulatory Visit (INDEPENDENT_AMBULATORY_CARE_PROVIDER_SITE_OTHER): Payer: Medicare HMO | Admitting: Nurse Practitioner

## 2023-06-10 ENCOUNTER — Encounter (INDEPENDENT_AMBULATORY_CARE_PROVIDER_SITE_OTHER): Payer: Self-pay | Admitting: Nurse Practitioner

## 2023-06-10 VITALS — BP 130/77 | HR 64 | Resp 16

## 2023-06-10 DIAGNOSIS — Z89511 Acquired absence of right leg below knee: Secondary | ICD-10-CM | POA: Diagnosis not present

## 2023-06-10 DIAGNOSIS — Z7982 Long term (current) use of aspirin: Secondary | ICD-10-CM | POA: Diagnosis not present

## 2023-06-10 DIAGNOSIS — I6523 Occlusion and stenosis of bilateral carotid arteries: Secondary | ICD-10-CM | POA: Diagnosis not present

## 2023-06-10 DIAGNOSIS — I471 Supraventricular tachycardia, unspecified: Secondary | ICD-10-CM | POA: Diagnosis not present

## 2023-06-10 DIAGNOSIS — I10 Essential (primary) hypertension: Secondary | ICD-10-CM | POA: Diagnosis not present

## 2023-06-10 DIAGNOSIS — E782 Mixed hyperlipidemia: Secondary | ICD-10-CM | POA: Diagnosis not present

## 2023-06-10 DIAGNOSIS — Z7901 Long term (current) use of anticoagulants: Secondary | ICD-10-CM | POA: Diagnosis not present

## 2023-06-10 DIAGNOSIS — Z87891 Personal history of nicotine dependence: Secondary | ICD-10-CM | POA: Diagnosis not present

## 2023-06-10 DIAGNOSIS — I70263 Atherosclerosis of native arteries of extremities with gangrene, bilateral legs: Secondary | ICD-10-CM | POA: Diagnosis not present

## 2023-06-10 DIAGNOSIS — Z4781 Encounter for orthopedic aftercare following surgical amputation: Secondary | ICD-10-CM | POA: Diagnosis not present

## 2023-06-10 NOTE — Progress Notes (Signed)
 Subjective:    Patient ID: Yvonne [name], female    DOB: 1956/02/07, 68 y.o.   MRN: [id number] Chief Complaint  Patient presents with   Follow-up    1-2 week staple removal    The patient is a 68 year old female who returns today for staple removal of her right below-knee amputation.  Amputation was done on 05/09/2023.  The incision itself is doing well with no evidence of drainage.  She notes some tenderness near the lateral areas.  She previously had half of her staples removed.  There has been no dehiscence since that time.  We started her on gabapentin  for phantom limb pain and she notes that has been very helpful for her    Review of Systems  Skin:  Positive for wound.  Neurological:  Positive for weakness.  All other systems reviewed and are negative.      Objective:   Physical Exam Vitals reviewed.  HENT:     Head: Normocephalic.  Cardiovascular:     Rate and Rhythm: Normal rate.  Pulmonary:     Effort: Pulmonary effort is normal.  Musculoskeletal:     Right Lower Extremity: Right leg is amputated below knee.  Skin:    General: Skin is warm and dry.  Neurological:     Mental Status: She is alert and oriented to person, place, and time.  Psychiatric:        Mood and Affect: Mood normal.        Behavior: Behavior normal.        Thought Content: Thought content normal.        Judgment: Judgment normal.     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)    Bilateral carotid artery stenosis    Dysrhythmia    Hyperlipidemia    Hypertension    Ischemic leg    Peripheral vascular disease (HCC)    PSVT (paroxysmal supraventricular tachycardia) (HCC)    Syncope     Social History   Socioeconomic History   Marital status: Divorced    Spouse name: Not on file    Number of children: 2   Years of education: Not on file   Highest education level: Not on file  Occupational History   Not on file  Tobacco Use   Smoking status: Former    Current packs/day: 0.00    Types: Cigarettes    Quit date: 10/27/2021    Years since quitting: 1.6   Smokeless tobacco: Never  Vaping Use   Vaping status: Never Used  Substance and Sexual Activity   Alcohol use: Not Currently    Comment: rarely   Drug use: Never   Sexual activity: Not on file  Other Topics Concern   Not on file  Social History Narrative   Lives by herself now.    Social Drivers of Corporate Investment Banker Strain: Not on file  Food Insecurity: No Food Insecurity (05/09/2023)   Hunger Vital Sign    Worried About Running Out of Food in the Last Year: Never true    Ran Out of Food in the Last Year: Never true  Recent Concern: Food Insecurity - Food Insecurity Present (02/18/2023)   Hunger Vital Sign    Worried About Programme Researcher, Broadcasting/film/video in  the Last Year: Sometimes true    Ran Out of Food in the Last Year: Never true  Transportation Needs: No Transportation Needs (05/09/2023)   PRAPARE - Administrator, Civil Service (Medical): No    Lack of Transportation (Non-Medical): No  Physical Activity: Not on file  Stress: Not on file  Social Connections: Not on file  Intimate Partner Violence: Not At Risk (05/09/2023)   Humiliation, Afraid, Rape, and Kick questionnaire    Fear of Current or Ex-Partner: No    Emotionally Abused: No    Physically Abused: No    Sexually Abused: No    Past Surgical History:  Procedure Laterality Date   ABDOMINAL HYSTERECTOMY     AMPUTATION Right 05/09/2023   Procedure: AMPUTATION BELOW KNEE;  Surgeon: Marea Selinda RAMAN, MD;  Location: [facility];  Service: Vascular;  Laterality: Right;   LOWER EXTREMITY ANGIOGRAPHY Right 10/02/2021   Procedure: Lower Extremity Angiography;  Surgeon: Marea Selinda RAMAN, MD;  Location: [facility];  Service:  Cardiovascular;  Laterality: Right;   LOWER EXTREMITY ANGIOGRAPHY Left 10/09/2021   Procedure: Lower Extremity Angiography;  Surgeon: Marea Selinda RAMAN, MD;  Location: [facility];  Service: Cardiovascular;  Laterality: Left;   LOWER EXTREMITY ANGIOGRAPHY Right 10/27/2021   Procedure: Lower Extremity Angiography;  Surgeon: Marea Selinda RAMAN, MD;  Location: [facility];  Service: Cardiovascular;  Laterality: Right;   LOWER EXTREMITY ANGIOGRAPHY Right 02/15/2022   Procedure: Lower Extremity Angiography;  Surgeon: Marea Selinda RAMAN, MD;  Location: [facility];  Service: Cardiovascular;  Laterality: Right;   LOWER EXTREMITY ANGIOGRAPHY Right 02/28/2022   Procedure: Lower Extremity Angiography;  Surgeon: Marea Selinda RAMAN, MD;  Location: [facility];  Service: Cardiovascular;  Laterality: Right;   LOWER EXTREMITY ANGIOGRAPHY Right 02/18/2023   Procedure: Lower Extremity Angiography;  Surgeon: Marea Selinda RAMAN, MD;  Location: [facility];  Service: Cardiovascular;  Laterality: Right;   LOWER EXTREMITY ANGIOGRAPHY Right 02/19/2023   Procedure: Lower Extremity Angiography;  Surgeon: Marea Selinda RAMAN, MD;  Location: [facility];  Service: Cardiovascular;  Laterality: Right;   LOWER EXTREMITY INTERVENTION Right 10/27/2021   Procedure: LOWER EXTREMITY INTERVENTION;  Surgeon: Marea Selinda RAMAN, MD;  Location: [facility];  Service: Cardiovascular;  Laterality: Right;   LOWER EXTREMITY INTERVENTION Right 02/15/2022   Procedure: LOWER EXTREMITY INTERVENTION;  Surgeon: Marea Selinda RAMAN, MD;  Location: [facility];  Service: Cardiovascular;  Laterality: Right;   PARTIAL HYSTERECTOMY     PERIPHERAL VASCULAR THROMBECTOMY Right 03/01/2022   Procedure: PERIPHERAL VASCULAR THROMBECTOMY;  Surgeon: Marea Selinda RAMAN, MD;  Location: [facility];  Service: Cardiovascular;  Laterality: Right;    No family history on file.  No Known Allergies     Latest Ref Rng & Units  05/11/2023    4:07 AM 05/10/2023    5:17 AM 05/09/2023   12:04 PM  CBC  WBC 4.0 - 10.5 K/uL 9.7  10.6  6.8   Hemoglobin 12.0 - 15.0 g/dL 9.8  9.7  88.6   Hematocrit 36.0 - 46.0 % 30.2  29.5  35.4   Platelets 150 - 400 K/uL 298  299  374       CMP     Component Value Date/Time   NA 136 05/11/2023 0407   K 3.4 (L) 05/11/2023 0407   CL 104 05/11/2023 0407   CO2 24 05/11/2023 0407   GLUCOSE 111 (H) 05/11/2023 0407  BUN 8 05/11/2023 0407   CREATININE 0.51 05/11/2023 0407   CALCIUM  8.8 (L) 05/11/2023 0407   PROT 7.3 02/28/2022 1929   ALBUMIN 3.2 (L) 02/28/2022 1929   AST 27 02/28/2022 1929   ALT 23 02/28/2022 1929   ALKPHOS 134 (H) 02/28/2022 1929   BILITOT 0.7 02/28/2022 1929   GFRNONAA >60 05/11/2023 0407     No results found.     Assessment & Plan:   1. Hx of BKA, right (HCC) (Primary) Today the patient had remaining staples removed.  She tolerated this well.  No signs of infection or drainage.  Wound was dressed with Steri-Strips.  Patient will continue with gabapentin .  Will have her return in 3 to 4 weeks for wound evaluation if well-healed we will send for referral for prosthetic.  Current Outpatient Medications on File Prior to Visit  Medication Sig Dispense Refill   amLODipine  (NORVASC ) 10 MG tablet Take 1 tablet by mouth once daily 30 tablet 0   aspirin  EC 81 MG tablet Take 81 mg by mouth daily. Swallow whole.     atorvastatin  (LIPITOR) 20 MG tablet Take 20 mg by mouth daily.     cholecalciferol  (VITAMIN D3) 25 MCG (1000 UNIT) tablet Take 1,000 Units by mouth daily.     diphenhydramine -acetaminophen  (TYLENOL  PM) 25-500 MG TABS tablet Take 1 tablet by mouth at bedtime as needed.     gabapentin  (NEURONTIN ) 300 MG capsule Take 1 capsule (300 mg total) by mouth at bedtime. 30 capsule 0   metoprolol  tartrate (LOPRESSOR ) 25 MG tablet Take 1 tablet (25 mg total) by mouth 2 (two) times daily. 60 tablet 3   oxyCODONE -acetaminophen  (PERCOCET/ROXICET) 5-325 MG tablet Take  1 tablet by mouth every 4 (four) hours as needed for severe pain (pain score 7-10). 30 tablet 0   rivaroxaban  (XARELTO ) 20 MG TABS tablet Take 1 tablet (20 mg total) by mouth daily. (Patient not taking: Reported on 05/09/2023) 30 tablet 3   No current facility-administered medications on file prior to visit.    There are no Patient Instructions on file for this visit. No follow-ups on file.   Joyel Chenette E Dannel Rafter, NP

## 2023-06-12 DIAGNOSIS — I6523 Occlusion and stenosis of bilateral carotid arteries: Secondary | ICD-10-CM | POA: Diagnosis not present

## 2023-06-12 DIAGNOSIS — Z7982 Long term (current) use of aspirin: Secondary | ICD-10-CM | POA: Diagnosis not present

## 2023-06-12 DIAGNOSIS — Z87891 Personal history of nicotine dependence: Secondary | ICD-10-CM | POA: Diagnosis not present

## 2023-06-12 DIAGNOSIS — Z7901 Long term (current) use of anticoagulants: Secondary | ICD-10-CM | POA: Diagnosis not present

## 2023-06-12 DIAGNOSIS — I70263 Atherosclerosis of native arteries of extremities with gangrene, bilateral legs: Secondary | ICD-10-CM | POA: Diagnosis not present

## 2023-06-12 DIAGNOSIS — E782 Mixed hyperlipidemia: Secondary | ICD-10-CM | POA: Diagnosis not present

## 2023-06-12 DIAGNOSIS — I10 Essential (primary) hypertension: Secondary | ICD-10-CM | POA: Diagnosis not present

## 2023-06-12 DIAGNOSIS — Z4781 Encounter for orthopedic aftercare following surgical amputation: Secondary | ICD-10-CM | POA: Diagnosis not present

## 2023-06-12 DIAGNOSIS — Z89511 Acquired absence of right leg below knee: Secondary | ICD-10-CM | POA: Diagnosis not present

## 2023-06-12 DIAGNOSIS — I471 Supraventricular tachycardia, unspecified: Secondary | ICD-10-CM | POA: Diagnosis not present

## 2023-06-14 DIAGNOSIS — I471 Supraventricular tachycardia, unspecified: Secondary | ICD-10-CM | POA: Diagnosis not present

## 2023-06-14 DIAGNOSIS — Z7901 Long term (current) use of anticoagulants: Secondary | ICD-10-CM | POA: Diagnosis not present

## 2023-06-14 DIAGNOSIS — Z89511 Acquired absence of right leg below knee: Secondary | ICD-10-CM | POA: Diagnosis not present

## 2023-06-14 DIAGNOSIS — I6523 Occlusion and stenosis of bilateral carotid arteries: Secondary | ICD-10-CM | POA: Diagnosis not present

## 2023-06-14 DIAGNOSIS — I10 Essential (primary) hypertension: Secondary | ICD-10-CM | POA: Diagnosis not present

## 2023-06-14 DIAGNOSIS — Z7982 Long term (current) use of aspirin: Secondary | ICD-10-CM | POA: Diagnosis not present

## 2023-06-14 DIAGNOSIS — Z4781 Encounter for orthopedic aftercare following surgical amputation: Secondary | ICD-10-CM | POA: Diagnosis not present

## 2023-06-14 DIAGNOSIS — I70263 Atherosclerosis of native arteries of extremities with gangrene, bilateral legs: Secondary | ICD-10-CM | POA: Diagnosis not present

## 2023-06-14 DIAGNOSIS — E782 Mixed hyperlipidemia: Secondary | ICD-10-CM | POA: Diagnosis not present

## 2023-06-14 DIAGNOSIS — Z87891 Personal history of nicotine dependence: Secondary | ICD-10-CM | POA: Diagnosis not present

## 2023-06-25 DIAGNOSIS — I6523 Occlusion and stenosis of bilateral carotid arteries: Secondary | ICD-10-CM | POA: Diagnosis not present

## 2023-06-25 DIAGNOSIS — Z87891 Personal history of nicotine dependence: Secondary | ICD-10-CM | POA: Diagnosis not present

## 2023-06-25 DIAGNOSIS — I10 Essential (primary) hypertension: Secondary | ICD-10-CM | POA: Diagnosis not present

## 2023-06-25 DIAGNOSIS — I471 Supraventricular tachycardia, unspecified: Secondary | ICD-10-CM | POA: Diagnosis not present

## 2023-06-25 DIAGNOSIS — E782 Mixed hyperlipidemia: Secondary | ICD-10-CM | POA: Diagnosis not present

## 2023-06-25 DIAGNOSIS — Z4781 Encounter for orthopedic aftercare following surgical amputation: Secondary | ICD-10-CM | POA: Diagnosis not present

## 2023-06-25 DIAGNOSIS — Z7901 Long term (current) use of anticoagulants: Secondary | ICD-10-CM | POA: Diagnosis not present

## 2023-06-25 DIAGNOSIS — Z89511 Acquired absence of right leg below knee: Secondary | ICD-10-CM | POA: Diagnosis not present

## 2023-06-25 DIAGNOSIS — I70263 Atherosclerosis of native arteries of extremities with gangrene, bilateral legs: Secondary | ICD-10-CM | POA: Diagnosis not present

## 2023-06-25 DIAGNOSIS — Z7982 Long term (current) use of aspirin: Secondary | ICD-10-CM | POA: Diagnosis not present

## 2023-06-28 ENCOUNTER — Other Ambulatory Visit (INDEPENDENT_AMBULATORY_CARE_PROVIDER_SITE_OTHER): Payer: Self-pay | Admitting: Nurse Practitioner

## 2023-07-01 ENCOUNTER — Telehealth (INDEPENDENT_AMBULATORY_CARE_PROVIDER_SITE_OTHER): Payer: Self-pay

## 2023-07-01 NOTE — Telephone Encounter (Signed)
 Message given

## 2023-07-01 NOTE — Telephone Encounter (Signed)
I'll take it out at her follow up.  Sometimes the limb swells and hides staples and as the swelling goes down we see that one or two was left.  We can take it out then

## 2023-07-01 NOTE — Telephone Encounter (Signed)
Yvonne [name] left a voicemail stating she had her staples removed 2 weeks ago, but she has a staple left in her leg. Please advise

## 2023-07-03 DIAGNOSIS — Z7901 Long term (current) use of anticoagulants: Secondary | ICD-10-CM | POA: Diagnosis not present

## 2023-07-03 DIAGNOSIS — Z4781 Encounter for orthopedic aftercare following surgical amputation: Secondary | ICD-10-CM | POA: Diagnosis not present

## 2023-07-03 DIAGNOSIS — Z89511 Acquired absence of right leg below knee: Secondary | ICD-10-CM | POA: Diagnosis not present

## 2023-07-03 DIAGNOSIS — I471 Supraventricular tachycardia, unspecified: Secondary | ICD-10-CM | POA: Diagnosis not present

## 2023-07-03 DIAGNOSIS — I10 Essential (primary) hypertension: Secondary | ICD-10-CM | POA: Diagnosis not present

## 2023-07-03 DIAGNOSIS — E782 Mixed hyperlipidemia: Secondary | ICD-10-CM | POA: Diagnosis not present

## 2023-07-03 DIAGNOSIS — I6523 Occlusion and stenosis of bilateral carotid arteries: Secondary | ICD-10-CM | POA: Diagnosis not present

## 2023-07-03 DIAGNOSIS — I70263 Atherosclerosis of native arteries of extremities with gangrene, bilateral legs: Secondary | ICD-10-CM | POA: Diagnosis not present

## 2023-07-03 DIAGNOSIS — Z7982 Long term (current) use of aspirin: Secondary | ICD-10-CM | POA: Diagnosis not present

## 2023-07-03 DIAGNOSIS — Z87891 Personal history of nicotine dependence: Secondary | ICD-10-CM | POA: Diagnosis not present

## 2023-07-08 ENCOUNTER — Encounter (INDEPENDENT_AMBULATORY_CARE_PROVIDER_SITE_OTHER): Payer: Self-pay | Admitting: Nurse Practitioner

## 2023-07-08 ENCOUNTER — Ambulatory Visit (INDEPENDENT_AMBULATORY_CARE_PROVIDER_SITE_OTHER): Payer: Medicare HMO | Admitting: Nurse Practitioner

## 2023-07-08 VITALS — BP 121/77 | HR 67 | Resp 18 | Ht 63.0 in | Wt 116.0 lb

## 2023-07-08 DIAGNOSIS — Z89511 Acquired absence of right leg below knee: Secondary | ICD-10-CM

## 2023-07-08 NOTE — Progress Notes (Signed)
 Subjective:    Patient ID: Yvonne [name], female    DOB: 1956-03-13, 68 y.o.   MRN: [id number] Chief Complaint  Patient presents with   Follow-up    Encounter form : F/u 3-4 weeks  no studies    The patient is a 68 year old female who returns today for wound evaluation of her right below-knee amputation.  Amputation was done on 05/09/2023.  The incision itself is doing well mostly with just 2 small areas that are scabbing.  Currently none of those areas are open or draining.  She had 1 residual staple that was removed today.  We started her on gabapentin  for phantom limb pain and she notes that has been very helpful for her    Review of Systems  Skin:  Positive for wound.  Neurological:  Positive for weakness.  All other systems reviewed and are negative.      Objective:   Physical Exam Vitals reviewed.  HENT:     Head: Normocephalic.  Cardiovascular:     Rate and Rhythm: Normal rate.  Pulmonary:     Effort: Pulmonary effort is normal.  Musculoskeletal:     Right Lower Extremity: Right leg is amputated below knee.  Skin:    General: Skin is warm and dry.  Neurological:     Mental Status: She is alert and oriented to person, place, and time.  Psychiatric:        Mood and Affect: Mood normal.        Behavior: Behavior normal.        Thought Content: Thought content normal.        Judgment: Judgment normal.     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)    Bilateral carotid artery stenosis    Dysrhythmia    Hyperlipidemia    Hypertension    Ischemic leg    Peripheral vascular disease (HCC)    PSVT (paroxysmal supraventricular tachycardia) (HCC)    Syncope     Social History   Socioeconomic  History   Marital status: Divorced    Spouse name: Not on file   Number of children: 2   Years of education: Not on file   Highest education level: Not on file  Occupational History   Not on file  Tobacco Use   Smoking status: Former    Current packs/day: 0.00    Types: Cigarettes    Quit date: 10/27/2021    Years since quitting: 1.6   Smokeless tobacco: Never  Vaping Use   Vaping status: Never Used  Substance and Sexual Activity   Alcohol use: Not Currently    Comment: rarely   Drug use: Never   Sexual activity: Not on file  Other Topics Concern   Not on file  Social History Narrative   Lives by herself now.    Social Drivers of Corporate investment banker Strain: Not on file  Food Insecurity: No Food Insecurity (05/09/2023)   Hunger Vital Sign    Worried About Running Out of Food in the Last Year: Never true    Ran Out of Food in the Last Year: Never true  Recent Concern: Food  Insecurity - Food Insecurity Present (02/18/2023)   Hunger Vital Sign    Worried About Running Out of Food in the Last Year: Sometimes true    Ran Out of Food in the Last Year: Never true  Transportation Needs: No Transportation Needs (05/09/2023)   PRAPARE - Administrator, Civil Service (Medical): No    Lack of Transportation (Non-Medical): No  Physical Activity: Not on file  Stress: Not on file  Social Connections: Not on file  Intimate Partner Violence: Not At Risk (05/09/2023)   Humiliation, Afraid, Rape, and Kick questionnaire    Fear of Current or Ex-Partner: No    Emotionally Abused: No    Physically Abused: No    Sexually Abused: No    Past Surgical History:  Procedure Laterality Date   ABDOMINAL HYSTERECTOMY     AMPUTATION Right 05/09/2023   Procedure: AMPUTATION BELOW KNEE;  Surgeon: Celso College, MD;  Location: [facility];  Service: Vascular;  Laterality: Right;   LOWER EXTREMITY ANGIOGRAPHY Right 10/02/2021   Procedure: Lower Extremity Angiography;  Surgeon: Celso College, MD;  Location: [facility];  Service: Cardiovascular;  Laterality: Right;   LOWER EXTREMITY ANGIOGRAPHY Left 10/09/2021   Procedure: Lower Extremity Angiography;  Surgeon: Celso College, MD;  Location: [facility];  Service: Cardiovascular;  Laterality: Left;   LOWER EXTREMITY ANGIOGRAPHY Right 10/27/2021   Procedure: Lower Extremity Angiography;  Surgeon: Celso College, MD;  Location: [facility];  Service: Cardiovascular;  Laterality: Right;   LOWER EXTREMITY ANGIOGRAPHY Right 02/15/2022   Procedure: Lower Extremity Angiography;  Surgeon: Celso College, MD;  Location: [facility];  Service: Cardiovascular;  Laterality: Right;   LOWER EXTREMITY ANGIOGRAPHY Right 02/28/2022   Procedure: Lower Extremity Angiography;  Surgeon: Celso College, MD;  Location: [facility];  Service: Cardiovascular;  Laterality: Right;   LOWER EXTREMITY ANGIOGRAPHY Right 02/18/2023   Procedure: Lower Extremity Angiography;  Surgeon: Celso College, MD;  Location: [facility];  Service: Cardiovascular;  Laterality: Right;   LOWER EXTREMITY ANGIOGRAPHY Right 02/19/2023   Procedure: Lower Extremity Angiography;  Surgeon: Celso College, MD;  Location: [facility];  Service: Cardiovascular;  Laterality: Right;   LOWER EXTREMITY INTERVENTION Right 10/27/2021   Procedure: LOWER EXTREMITY INTERVENTION;  Surgeon: Celso College, MD;  Location: [facility];  Service: Cardiovascular;  Laterality: Right;   LOWER EXTREMITY INTERVENTION Right 02/15/2022   Procedure: LOWER EXTREMITY INTERVENTION;  Surgeon: Celso College, MD;  Location: [facility];  Service: Cardiovascular;  Laterality: Right;   PARTIAL HYSTERECTOMY     PERIPHERAL VASCULAR THROMBECTOMY Right 03/01/2022   Procedure: PERIPHERAL VASCULAR THROMBECTOMY;  Surgeon: Celso College, MD;  Location: [facility];  Service: Cardiovascular;  Laterality: Right;    History reviewed. No pertinent  family history.  No Known Allergies     Latest Ref Rng & Units 05/11/2023    4:07 AM 05/10/2023    5:17 AM 05/09/2023   12:04 PM  CBC  WBC 4.0 - 10.5 K/uL 9.7  10.6  6.8   Hemoglobin 12.0 - 15.0 g/dL 9.8  9.7  72.5   Hematocrit 36.0 - 46.0 % 30.2  29.5  35.4   Platelets 150 - 400 K/uL 298  299  374       CMP     Component Value Date/Time   NA 136 05/11/2023 0407   K 3.4 (  L) 05/11/2023 0407   CL 104 05/11/2023 0407   CO2 24 05/11/2023 0407   GLUCOSE 111 (H) 05/11/2023 0407   BUN 8 05/11/2023 0407   CREATININE 0.51 05/11/2023 0407   CALCIUM  8.8 (L) 05/11/2023 0407   PROT 7.3 02/28/2022 1929   ALBUMIN 3.2 (L) 02/28/2022 1929   AST 27 02/28/2022 1929   ALT 23 02/28/2022 1929   ALKPHOS 134 (H) 02/28/2022 1929   BILITOT 0.7 02/28/2022 1929   GFRNONAA >60 05/11/2023 0407     No results found.     Assessment & Plan:   1. Hx of BKA, right (HCC) (Primary) The remaining staple was removed.  This was tolerated well.  She currently does not have any signs of infection.  However there is 2 small areas that are scabbed which are still in the healing phase.  I do not feel she is quite ready for prosthetic but I do think that her shrinker would be helpful at this time.  We will attempt to facilitate this for the patient.  Will have her return in 3 to 4 weeks for wound evaluation.  Current Outpatient Medications on File Prior to Visit  Medication Sig Dispense Refill   amLODipine  (NORVASC ) 10 MG tablet Take 1 tablet by mouth once daily 30 tablet 0   aspirin  EC 81 MG tablet Take 81 mg by mouth daily. Swallow whole.     atorvastatin  (LIPITOR) 20 MG tablet Take 20 mg by mouth daily.     cholecalciferol  (VITAMIN D3) 25 MCG (1000 UNIT) tablet Take 1,000 Units by mouth daily.     diphenhydramine -acetaminophen  (TYLENOL  PM) 25-500 MG TABS tablet Take 1 tablet by mouth at bedtime as needed.     gabapentin  (NEURONTIN ) 300 MG capsule Take 1 capsule by mouth at bedtime 30 capsule 0    metoprolol  tartrate (LOPRESSOR ) 25 MG tablet Take 1 tablet (25 mg total) by mouth 2 (two) times daily. 60 tablet 3   oxyCODONE -acetaminophen  (PERCOCET/ROXICET) 5-325 MG tablet Take 1 tablet by mouth every 4 (four) hours as needed for severe pain (pain score 7-10). 30 tablet 0   rivaroxaban  (XARELTO ) 20 MG TABS tablet Take 1 tablet (20 mg total) by mouth daily. 30 tablet 3   No current facility-administered medications on file prior to visit.    There are no Patient Instructions on file for this visit. No follow-ups on file.   Christianna Belmonte E Lashun Ramseyer, NP

## 2023-07-09 ENCOUNTER — Other Ambulatory Visit (INDEPENDENT_AMBULATORY_CARE_PROVIDER_SITE_OTHER): Payer: Self-pay

## 2023-07-09 ENCOUNTER — Telehealth (INDEPENDENT_AMBULATORY_CARE_PROVIDER_SITE_OTHER): Payer: Self-pay

## 2023-07-09 DIAGNOSIS — E782 Mixed hyperlipidemia: Secondary | ICD-10-CM | POA: Diagnosis not present

## 2023-07-09 DIAGNOSIS — I6523 Occlusion and stenosis of bilateral carotid arteries: Secondary | ICD-10-CM | POA: Diagnosis not present

## 2023-07-09 DIAGNOSIS — Z7901 Long term (current) use of anticoagulants: Secondary | ICD-10-CM | POA: Diagnosis not present

## 2023-07-09 DIAGNOSIS — Z7982 Long term (current) use of aspirin: Secondary | ICD-10-CM | POA: Diagnosis not present

## 2023-07-09 DIAGNOSIS — Z89511 Acquired absence of right leg below knee: Secondary | ICD-10-CM | POA: Diagnosis not present

## 2023-07-09 DIAGNOSIS — I10 Essential (primary) hypertension: Secondary | ICD-10-CM | POA: Diagnosis not present

## 2023-07-09 DIAGNOSIS — Z4781 Encounter for orthopedic aftercare following surgical amputation: Secondary | ICD-10-CM | POA: Diagnosis not present

## 2023-07-09 DIAGNOSIS — I70263 Atherosclerosis of native arteries of extremities with gangrene, bilateral legs: Secondary | ICD-10-CM | POA: Diagnosis not present

## 2023-07-09 DIAGNOSIS — I471 Supraventricular tachycardia, unspecified: Secondary | ICD-10-CM | POA: Diagnosis not present

## 2023-07-09 DIAGNOSIS — Z87891 Personal history of nicotine dependence: Secondary | ICD-10-CM | POA: Diagnosis not present

## 2023-07-09 MED ORDER — RIVAROXABAN 20 MG PO TABS
20.0000 mg | ORAL_TABLET | Freq: Every day | ORAL | 3 refills | Status: DC
Start: 2023-07-09 — End: 2023-12-03

## 2023-07-09 NOTE — Telephone Encounter (Signed)
Patient left a message requesting refill for Xarelto. Patient was notified that refill has been sent to [facility].

## 2023-08-05 ENCOUNTER — Ambulatory Visit (INDEPENDENT_AMBULATORY_CARE_PROVIDER_SITE_OTHER): Payer: Medicare HMO | Admitting: Nurse Practitioner

## 2023-08-12 ENCOUNTER — Telehealth (INDEPENDENT_AMBULATORY_CARE_PROVIDER_SITE_OTHER): Payer: Self-pay

## 2023-08-12 NOTE — Telephone Encounter (Addendum)
 Yvonne [name] called requesting a change in her medication. She stated that the Xarelto is $150 and she can't afford it.  Per protocol she should call her insurance and see what they will cover, correct?   Please advise

## 2023-08-13 NOTE — Telephone Encounter (Signed)
 Yes, sometimes insurance will cover other meds better, additionally if that doesn't work she can try reaching out to the company to see if they can reduce the cost as they have programs to assist with that

## 2023-08-13 NOTE — Telephone Encounter (Signed)
 Patient stated she will call her insurance first to see what they will cover and call to let us know.

## 2023-08-19 ENCOUNTER — Ambulatory Visit (INDEPENDENT_AMBULATORY_CARE_PROVIDER_SITE_OTHER): Admitting: Nurse Practitioner

## 2023-08-29 ENCOUNTER — Ambulatory Visit (INDEPENDENT_AMBULATORY_CARE_PROVIDER_SITE_OTHER): Admitting: Nurse Practitioner

## 2023-08-29 ENCOUNTER — Encounter (INDEPENDENT_AMBULATORY_CARE_PROVIDER_SITE_OTHER): Payer: Self-pay | Admitting: Nurse Practitioner

## 2023-08-29 VITALS — BP 151/90 | HR 78 | Resp 16

## 2023-08-29 DIAGNOSIS — E782 Mixed hyperlipidemia: Secondary | ICD-10-CM

## 2023-08-29 DIAGNOSIS — Z89511 Acquired absence of right leg below knee: Secondary | ICD-10-CM

## 2023-08-29 DIAGNOSIS — I10 Essential (primary) hypertension: Secondary | ICD-10-CM | POA: Diagnosis not present

## 2023-08-29 MED ORDER — CYCLOBENZAPRINE HCL 10 MG PO TABS
10.0000 mg | ORAL_TABLET | Freq: Three times a day (TID) | ORAL | 0 refills | Status: DC | PRN
Start: 2023-08-29 — End: 2023-10-17

## 2023-08-29 MED ORDER — GABAPENTIN 300 MG PO CAPS: 300.0000 mg | ORAL_CAPSULE | Freq: Every day | ORAL | 3 refills | Status: AC

## 2023-08-29 MED ORDER — OXYCODONE-ACETAMINOPHEN 5-325 MG PO TABS
1.0000 | ORAL_TABLET | ORAL | 0 refills | Status: DC | PRN
Start: 2023-08-29 — End: 2023-11-28

## 2023-09-01 NOTE — Progress Notes (Signed)
 Subjective:    Patient ID: Yvonne [name], female    DOB: Mar 09, 1956, 68 y.o.   MRN: [id number] Chief Complaint  Patient presents with   Follow-up    4 week follow up    The patient presents today for evaluation of her right below-knee amputation site.  She notes that she has been having significant pain more so about the knee area.  And evaluation appears that the knee is beginning to develop a contracture.  Her wound is scabbed over in 2 places but otherwise healing well.    Review of Systems  Musculoskeletal:  Positive for myalgias.  All other systems reviewed and are negative.      Objective:   Physical Exam Vitals reviewed.  HENT:     Head: Normocephalic.  Cardiovascular:     Rate and Rhythm: Normal rate.  Pulmonary:     Effort: Pulmonary effort is normal.  Musculoskeletal:     Right Lower Extremity: Right leg is amputated below knee.  Skin:    General: Skin is warm and dry.  Neurological:     Mental Status: She is alert and oriented to person, place, and time.  Psychiatric:        Mood and Affect: Mood normal.        Behavior: Behavior normal.        Thought Content: Thought content normal.        Judgment: Judgment normal.     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)    Bilateral carotid artery stenosis    Dysrhythmia    Hyperlipidemia    Hypertension    Ischemic leg    Peripheral vascular disease (HCC)    PSVT (paroxysmal supraventricular tachycardia) (HCC)    Syncope     Social History   Socioeconomic History   Marital status: Divorced    Spouse name: Not on file   Number of children: 2   Years of education: Not on file   Highest education level: Not on file  Occupational History   Not on file  Tobacco Use   Smoking status: Former     Current packs/day: 0.00    Types: Cigarettes    Quit date: 10/27/2021    Years since quitting: 1.8   Smokeless tobacco: Never  Vaping Use   Vaping status: Never Used  Substance and Sexual Activity   Alcohol use: Not Currently    Comment: rarely   Drug use: Never   Sexual activity: Not on file  Other Topics Concern   Not on file  Social History Narrative   Lives by herself now.    Social Drivers of Corporate investment banker Strain: Not on file  Food Insecurity: No Food Insecurity (05/09/2023)   Hunger Vital Sign    Worried About Running Out of Food in the Last Year: Never true    Ran Out of Food in the Last Year: Never true  Recent Concern: Food Insecurity - Food Insecurity Present (02/18/2023)   Hunger Vital Sign    Worried About Running Out of Food in the Last Year: Sometimes true    Ran Out of Food in the Last Year: Never true  Transportation Needs: No Transportation Needs (05/09/2023)   PRAPARE - Transportation  Lack of Transportation (Medical): No    Lack of Transportation (Non-Medical): No  Physical Activity: Not on file  Stress: Not on file  Social Connections: Not on file  Intimate Partner Violence: Not At Risk (05/09/2023)   Humiliation, Afraid, Rape, and Kick questionnaire    Fear of Current or Ex-Partner: No    Emotionally Abused: No    Physically Abused: No    Sexually Abused: No    Past Surgical History:  Procedure Laterality Date   ABDOMINAL HYSTERECTOMY     AMPUTATION Right 05/09/2023   Procedure: AMPUTATION BELOW KNEE;  Surgeon: Annice Needy, MD;  Location: [facility];  Service: Vascular;  Laterality: Right;   LOWER EXTREMITY ANGIOGRAPHY Right 10/02/2021   Procedure: Lower Extremity Angiography;  Surgeon: Annice Needy, MD;  Location: [facility];  Service: Cardiovascular;  Laterality: Right;   LOWER EXTREMITY ANGIOGRAPHY Left 10/09/2021   Procedure: Lower Extremity Angiography;  Surgeon: Annice Needy, MD;  Location: [facility];   Service: Cardiovascular;  Laterality: Left;   LOWER EXTREMITY ANGIOGRAPHY Right 10/27/2021   Procedure: Lower Extremity Angiography;  Surgeon: Annice Needy, MD;  Location: [facility];  Service: Cardiovascular;  Laterality: Right;   LOWER EXTREMITY ANGIOGRAPHY Right 02/15/2022   Procedure: Lower Extremity Angiography;  Surgeon: Annice Needy, MD;  Location: [facility];  Service: Cardiovascular;  Laterality: Right;   LOWER EXTREMITY ANGIOGRAPHY Right 02/28/2022   Procedure: Lower Extremity Angiography;  Surgeon: Annice Needy, MD;  Location: [facility];  Service: Cardiovascular;  Laterality: Right;   LOWER EXTREMITY ANGIOGRAPHY Right 02/18/2023   Procedure: Lower Extremity Angiography;  Surgeon: Annice Needy, MD;  Location: [facility];  Service: Cardiovascular;  Laterality: Right;   LOWER EXTREMITY ANGIOGRAPHY Right 02/19/2023   Procedure: Lower Extremity Angiography;  Surgeon: Annice Needy, MD;  Location: [facility];  Service: Cardiovascular;  Laterality: Right;   LOWER EXTREMITY INTERVENTION Right 10/27/2021   Procedure: LOWER EXTREMITY INTERVENTION;  Surgeon: Annice Needy, MD;  Location: [facility];  Service: Cardiovascular;  Laterality: Right;   LOWER EXTREMITY INTERVENTION Right 02/15/2022   Procedure: LOWER EXTREMITY INTERVENTION;  Surgeon: Annice Needy, MD;  Location: [facility];  Service: Cardiovascular;  Laterality: Right;   PARTIAL HYSTERECTOMY     PERIPHERAL VASCULAR THROMBECTOMY Right 03/01/2022   Procedure: PERIPHERAL VASCULAR THROMBECTOMY;  Surgeon: Annice Needy, MD;  Location: [facility];  Service: Cardiovascular;  Laterality: Right;    History reviewed. No pertinent family history.  No Known Allergies     Latest Ref Rng & Units 05/11/2023    4:07 AM 05/10/2023    5:17 AM 05/09/2023   12:04 PM  CBC  WBC 4.0 - 10.5 K/uL 9.7  10.6  6.8   Hemoglobin 12.0 - 15.0 g/dL 9.8  9.7  14.7   Hematocrit 36.0  - 46.0 % 30.2  29.5  35.4   Platelets 150 - 400 K/uL 298  299  374       CMP     Component Value Date/Time   NA 136 05/11/2023 0407   K 3.4 (L) 05/11/2023 0407   CL 104 05/11/2023 0407   CO2 24 05/11/2023 0407   GLUCOSE 111 (H) 05/11/2023 0407   BUN 8 05/11/2023 0407   CREATININE 0.51 05/11/2023 0407   CALCIUM 8.8 (L) 05/11/2023 0407   PROT 7.3 02/28/2022 1929   ALBUMIN 3.2 (L) 02/28/2022 1929  AST 27 02/28/2022 1929   ALT 23 02/28/2022 1929   ALKPHOS 134 (H) 02/28/2022 1929   BILITOT 0.7 02/28/2022 1929   GFRNONAA >60 05/11/2023 0407     No results found.     Assessment & Plan:   1. Hx of BKA, right (HCC) (Primary) The patient's pain I suspect is more related to a developing contracture.  Will send her in some muscle relaxers to see if this helps somewhat as well as renew her pain medication.  I am also going to try to obtain home health PT for her to see if we can help her contracture somewhat.  We had an extensive discussion about contractures and what this would mean for her going forward.  If we are unable to correct this, it is likely we may need to revise to an above-knee amputation. - Ambulatory referral to Home Health  2. Hypertension, unspecified type Continue antihypertensive medications as already ordered, these medications have been reviewed and there are no changes at this time.  3. Hyperlipidemia, mixed Continue statin as ordered and reviewed, no changes at this time   Current Outpatient Medications on File Prior to Visit  Medication Sig Dispense Refill   amLODipine (NORVASC) 10 MG tablet Take 1 tablet by mouth once daily 30 tablet 0   aspirin EC 81 MG tablet Take 81 mg by mouth daily. Swallow whole.     atorvastatin (LIPITOR) 20 MG tablet Take 20 mg by mouth daily.     cholecalciferol (VITAMIN D3) 25 MCG (1000 UNIT) tablet Take 1,000 Units by mouth daily.     diphenhydramine-acetaminophen (TYLENOL PM) 25-500 MG TABS tablet Take 1 tablet by mouth at  bedtime as needed.     metoprolol tartrate (LOPRESSOR) 25 MG tablet Take 1 tablet (25 mg total) by mouth 2 (two) times daily. 60 tablet 3   rivaroxaban (XARELTO) 20 MG TABS tablet Take 1 tablet (20 mg total) by mouth daily. 30 tablet 3   No current facility-administered medications on file prior to visit.    There are no Patient Instructions on file for this visit. No follow-ups on file.   Georgiana Spinner, NP

## 2023-09-11 ENCOUNTER — Telehealth (INDEPENDENT_AMBULATORY_CARE_PROVIDER_SITE_OTHER): Payer: Self-pay

## 2023-09-11 NOTE — Telephone Encounter (Signed)
 Requesting orders for physical therapy has been giving [facility] home health. Patient was notified that someone with [facility] will be contacting.

## 2023-09-16 ENCOUNTER — Other Ambulatory Visit: Payer: Self-pay | Admitting: Family Medicine

## 2023-09-16 DIAGNOSIS — Z1231 Encounter for screening mammogram for malignant neoplasm of breast: Secondary | ICD-10-CM

## 2023-09-25 ENCOUNTER — Telehealth (INDEPENDENT_AMBULATORY_CARE_PROVIDER_SITE_OTHER): Payer: Self-pay | Admitting: Nurse Practitioner

## 2023-09-25 ENCOUNTER — Telehealth (INDEPENDENT_AMBULATORY_CARE_PROVIDER_SITE_OTHER): Payer: Self-pay

## 2023-09-25 NOTE — Telephone Encounter (Signed)
 Verbal orders given and made [facility] aware we would get in touch with [facility] for shrinker she states understanding

## 2023-09-25 NOTE — Telephone Encounter (Signed)
 We can push it out a few weeks since she is working with PT so we can see how helpful it is

## 2023-09-25 NOTE — Telephone Encounter (Signed)
 Patient called stating that she just finished physical therapy today. Wants to know if she still needs to see FB tomorrow (09/26/23) or wait to see FB. Please advise.

## 2023-09-25 NOTE — Telephone Encounter (Signed)
 That is fine.  We can work on Hydrologist

## 2023-09-26 ENCOUNTER — Ambulatory Visit (INDEPENDENT_AMBULATORY_CARE_PROVIDER_SITE_OTHER): Admitting: Nurse Practitioner

## 2023-10-01 ENCOUNTER — Other Ambulatory Visit (INDEPENDENT_AMBULATORY_CARE_PROVIDER_SITE_OTHER): Payer: Self-pay | Admitting: Nurse Practitioner

## 2023-10-03 NOTE — Telephone Encounter (Signed)
 Can we call and see if this has been helping her?

## 2023-10-17 ENCOUNTER — Encounter (INDEPENDENT_AMBULATORY_CARE_PROVIDER_SITE_OTHER): Payer: Self-pay | Admitting: Nurse Practitioner

## 2023-10-17 ENCOUNTER — Ambulatory Visit (INDEPENDENT_AMBULATORY_CARE_PROVIDER_SITE_OTHER): Admitting: Nurse Practitioner

## 2023-10-17 VITALS — BP 168/74 | HR 81 | Resp 16

## 2023-10-17 DIAGNOSIS — I10 Essential (primary) hypertension: Secondary | ICD-10-CM | POA: Diagnosis not present

## 2023-10-17 DIAGNOSIS — Z89511 Acquired absence of right leg below knee: Secondary | ICD-10-CM

## 2023-10-17 DIAGNOSIS — E782 Mixed hyperlipidemia: Secondary | ICD-10-CM

## 2023-10-21 ENCOUNTER — Encounter (INDEPENDENT_AMBULATORY_CARE_PROVIDER_SITE_OTHER): Payer: Self-pay | Admitting: Nurse Practitioner

## 2023-10-21 NOTE — Progress Notes (Signed)
 Subjective:    Patient ID: Samaa Ueda, female    DOB: May 28, 1956, 68 y.o.   MRN: [id number] Chief Complaint  Patient presents with   Follow-up    4 week follow up    The patient returns today for follow-up evaluation of her right below-knee amputation site.  She has been working with physical therapy and her degree of motion has improved.  She notes that with working with physical therapy her pain has also improved.  She she also has some scabbing on her residual limb but that is also improving as well.  She notes that the muscle relaxers have been helpful for her as well.    Review of Systems  Musculoskeletal:  Positive for arthralgias.  Skin:  Positive for wound.  All other systems reviewed and are negative.      Objective:    Physical Exam Vitals reviewed.  HENT:     Head: Normocephalic.  Cardiovascular:     Rate and Rhythm: Normal rate.     Pulses: Normal pulses.  Pulmonary:     Effort: Pulmonary effort is normal.  Skin:    General: Skin is warm and dry.  Neurological:     Mental Status: She is alert and oriented to person, place, and time.  Psychiatric:        Mood and Affect: Mood normal.        Behavior: Behavior normal.        Thought Content: Thought content normal.        Judgment: Judgment normal.     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)    Bilateral carotid artery stenosis    Dysrhythmia    Hyperlipidemia    Hypertension    Ischemic leg    Peripheral vascular disease (HCC)    PSVT (paroxysmal supraventricular tachycardia) (HCC)    Syncope     Social History   Socioeconomic History   Marital status: Divorced    Spouse name: Not on file   Number of children: 2   Years of education: Not on file   Highest education level: Not on  file  Occupational History   Not on file  Tobacco Use   Smoking status: Former    Current packs/day: 0.00    Types: Cigarettes    Quit date: 10/27/2021    Years since quitting: 1.9   Smokeless tobacco: Never  Vaping Use   Vaping status: Never Used  Substance and Sexual Activity   Alcohol use: Not Currently    Comment: rarely   Drug use: Never   Sexual activity: Not on file  Other Topics Concern   Not on file  Social History Narrative   Lives by herself now.    Social Drivers of Corporate investment banker Strain: Not on file  Food Insecurity: No Food Insecurity (05/09/2023)   Hunger Vital Sign    Worried About Running Out of Food in the Last Year: Never true    Ran Out of Food in the Last Year: Never true  Recent Concern: Food Insecurity - Food Insecurity Present (02/18/2023)   Hunger Vital Sign    Worried About Running Out of Food in the Last Year: Sometimes true    Ran Out of Food in the  Last Year: Never true  Transportation Needs: No Transportation Needs (05/09/2023)   PRAPARE - Administrator, Civil Service (Medical): No    Lack of Transportation (Non-Medical): No  Physical Activity: Not on file  Stress: Not on file  Social Connections: Not on file  Intimate Partner Violence: Not At Risk (05/09/2023)   Humiliation, Afraid, Rape, and Kick questionnaire    Fear of Current or Ex-Partner: No    Emotionally Abused: No    Physically Abused: No    Sexually Abused: No    Past Surgical History:  Procedure Laterality Date   ABDOMINAL HYSTERECTOMY     AMPUTATION Right 05/09/2023   Procedure: AMPUTATION BELOW KNEE;  Surgeon: Celso College, MD;  Location: [facility];  Service: Vascular;  Laterality: Right;   LOWER EXTREMITY ANGIOGRAPHY Right 10/02/2021   Procedure: Lower Extremity Angiography;  Surgeon: Celso College, MD;  Location: [facility];  Service: Cardiovascular;  Laterality: Right;   LOWER EXTREMITY ANGIOGRAPHY Left 10/09/2021   Procedure: Lower  Extremity Angiography;  Surgeon: Celso College, MD;  Location: [facility];  Service: Cardiovascular;  Laterality: Left;   LOWER EXTREMITY ANGIOGRAPHY Right 10/27/2021   Procedure: Lower Extremity Angiography;  Surgeon: Celso College, MD;  Location: [facility];  Service: Cardiovascular;  Laterality: Right;   LOWER EXTREMITY ANGIOGRAPHY Right 02/15/2022   Procedure: Lower Extremity Angiography;  Surgeon: Celso College, MD;  Location: [facility];  Service: Cardiovascular;  Laterality: Right;   LOWER EXTREMITY ANGIOGRAPHY Right 02/28/2022   Procedure: Lower Extremity Angiography;  Surgeon: Celso College, MD;  Location: [facility];  Service: Cardiovascular;  Laterality: Right;   LOWER EXTREMITY ANGIOGRAPHY Right 02/18/2023   Procedure: Lower Extremity Angiography;  Surgeon: Celso College, MD;  Location: [facility];  Service: Cardiovascular;  Laterality: Right;   LOWER EXTREMITY ANGIOGRAPHY Right 02/19/2023   Procedure: Lower Extremity Angiography;  Surgeon: Celso College, MD;  Location: [facility];  Service: Cardiovascular;  Laterality: Right;   LOWER EXTREMITY INTERVENTION Right 10/27/2021   Procedure: LOWER EXTREMITY INTERVENTION;  Surgeon: Celso College, MD;  Location: [facility];  Service: Cardiovascular;  Laterality: Right;   LOWER EXTREMITY INTERVENTION Right 02/15/2022   Procedure: LOWER EXTREMITY INTERVENTION;  Surgeon: Celso College, MD;  Location: [facility];  Service: Cardiovascular;  Laterality: Right;   PARTIAL HYSTERECTOMY     PERIPHERAL VASCULAR THROMBECTOMY Right 03/01/2022   Procedure: PERIPHERAL VASCULAR THROMBECTOMY;  Surgeon: Celso College, MD;  Location: [facility];  Service: Cardiovascular;  Laterality: Right;    History reviewed. No pertinent family history.  No Known Allergies     Latest Ref Rng & Units 05/11/2023    4:07 AM 05/10/2023    5:17 AM 05/09/2023   12:04 PM  CBC  WBC 4.0 - 10.5  K/uL 9.7  10.6  6.8   Hemoglobin 12.0 - 15.0 g/dL 9.8  9.7  16.1   Hematocrit 36.0 - 46.0 % 30.2  29.5  35.4   Platelets 150 - 400 K/uL 298  299  374        CMP     Component Value Date/Time   NA 136 05/11/2023 0407   K 3.4 (L) 05/11/2023 0407   CL 104 05/11/2023 0407   CO2 24 05/11/2023 0407   GLUCOSE 111 (H) 05/11/2023 0407   BUN 8 05/11/2023 0407   CREATININE 0.51 05/11/2023 0407  CALCIUM  8.8 (L) 05/11/2023 0407   PROT 7.3 02/28/2022 1929   ALBUMIN 3.2 (L) 02/28/2022 1929   AST 27 02/28/2022 1929   ALT 23 02/28/2022 1929   ALKPHOS 134 (H) 02/28/2022 1929   BILITOT 0.7 02/28/2022 1929   GFRNONAA >60 05/11/2023 0407     No results found.     Assessment & Plan:   1. Hx of BKA, right (HCC) (Primary) The patient has been making progress with physical therapy and her pain is improving as the contracture lessens.  Her wound itself is very nearly healed just some scabbing at the distal portion.  Will have her return in approximately 6 weeks but in the interim we will also send a referral to [place] [facility].  2. Benign essential HTN Continue antihypertensive medications as already ordered, these medications have been reviewed and there are no changes at this time.  3. Hyperlipidemia, mixed Continue statin as ordered and reviewed, no changes at this time   Current Outpatient Medications on File Prior to Visit  Medication Sig Dispense Refill   amLODipine  (NORVASC ) 10 MG tablet Take 1 tablet by mouth once daily 30 tablet 0   aspirin  EC 81 MG tablet Take 81 mg by mouth daily. Swallow whole.     atorvastatin  (LIPITOR) 20 MG tablet Take 20 mg by mouth daily.     cholecalciferol  (VITAMIN D3) 25 MCG (1000 UNIT) tablet Take 1,000 Units by mouth daily.     diphenhydramine -acetaminophen  (TYLENOL  PM) 25-500 MG TABS tablet Take 1 tablet by mouth at bedtime as needed.     gabapentin  (NEURONTIN ) 300 MG capsule Take 1 capsule (300 mg total) by mouth at bedtime. 30 capsule 3   metoprolol   tartrate (LOPRESSOR ) 25 MG tablet Take 1 tablet (25 mg total) by mouth 2 (two) times daily. 60 tablet 3   oxyCODONE -acetaminophen  (PERCOCET/ROXICET) 5-325 MG tablet Take 1 tablet by mouth every 4 (four) hours as needed for severe pain (pain score 7-10). 30 tablet 0   rivaroxaban  (XARELTO ) 20 MG TABS tablet Take 1 tablet (20 mg total) by mouth daily. 30 tablet 3   cyclobenzaprine  (FLEXERIL ) 10 MG tablet Take 1 tablet by mouth three times daily as needed for muscle spasm 30 tablet 3   No current facility-administered medications on file prior to visit.    There are no Patient Instructions on file for this visit. No follow-ups on file.   Rakiyah Esch E Deone Omahoney, NP

## 2023-11-08 DIAGNOSIS — Z87891 Personal history of nicotine dependence: Secondary | ICD-10-CM | POA: Diagnosis not present

## 2023-11-08 DIAGNOSIS — I70223 Atherosclerosis of native arteries of extremities with rest pain, bilateral legs: Secondary | ICD-10-CM | POA: Diagnosis not present

## 2023-11-08 DIAGNOSIS — Z7901 Long term (current) use of anticoagulants: Secondary | ICD-10-CM | POA: Diagnosis not present

## 2023-11-08 DIAGNOSIS — T879 Unspecified complications of amputation stump: Secondary | ICD-10-CM | POA: Diagnosis not present

## 2023-11-08 DIAGNOSIS — G547 Phantom limb syndrome without pain: Secondary | ICD-10-CM | POA: Diagnosis not present

## 2023-11-08 DIAGNOSIS — D6869 Other thrombophilia: Secondary | ICD-10-CM | POA: Diagnosis not present

## 2023-11-08 DIAGNOSIS — I82509 Chronic embolism and thrombosis of unspecified deep veins of unspecified lower extremity: Secondary | ICD-10-CM | POA: Diagnosis not present

## 2023-11-08 DIAGNOSIS — I4891 Unspecified atrial fibrillation: Secondary | ICD-10-CM | POA: Diagnosis not present

## 2023-11-08 DIAGNOSIS — G629 Polyneuropathy, unspecified: Secondary | ICD-10-CM | POA: Diagnosis not present

## 2023-11-08 DIAGNOSIS — I129 Hypertensive chronic kidney disease with stage 1 through stage 4 chronic kidney disease, or unspecified chronic kidney disease: Secondary | ICD-10-CM | POA: Diagnosis not present

## 2023-11-08 DIAGNOSIS — Z5982 Transportation insecurity: Secondary | ICD-10-CM | POA: Diagnosis not present

## 2023-11-08 DIAGNOSIS — E785 Hyperlipidemia, unspecified: Secondary | ICD-10-CM | POA: Diagnosis not present

## 2023-11-19 ENCOUNTER — Other Ambulatory Visit (INDEPENDENT_AMBULATORY_CARE_PROVIDER_SITE_OTHER): Payer: Self-pay | Admitting: Nurse Practitioner

## 2023-11-19 ENCOUNTER — Telehealth (INDEPENDENT_AMBULATORY_CARE_PROVIDER_SITE_OTHER): Payer: Self-pay

## 2023-11-19 DIAGNOSIS — Z89511 Acquired absence of right leg below knee: Secondary | ICD-10-CM

## 2023-11-19 NOTE — Telephone Encounter (Signed)
 Sent!

## 2023-11-19 NOTE — Telephone Encounter (Signed)
 Yvonne [name] with [facility] physical therapy left  message informing that the patient is being d/c  from physical therapy. Patient is doing well with range of motion with the right knee. [facility] was requesting for the patient to be referred to outpatient physical therapy to continue strengthen range in motion. Please Advise

## 2023-11-20 NOTE — Telephone Encounter (Signed)
 Reach out to the physical therapist and notified that outpatient referral has been sent. I attempted to contact the patient but was not able to leave voicemail.

## 2023-11-27 ENCOUNTER — Telehealth (INDEPENDENT_AMBULATORY_CARE_PROVIDER_SITE_OTHER): Payer: Self-pay

## 2023-11-27 ENCOUNTER — Encounter (INDEPENDENT_AMBULATORY_CARE_PROVIDER_SITE_OTHER): Payer: Self-pay | Admitting: Nurse Practitioner

## 2023-11-27 ENCOUNTER — Ambulatory Visit (INDEPENDENT_AMBULATORY_CARE_PROVIDER_SITE_OTHER): Admitting: Nurse Practitioner

## 2023-11-27 VITALS — BP 121/74 | HR 75

## 2023-11-27 DIAGNOSIS — Z89511 Acquired absence of right leg below knee: Secondary | ICD-10-CM

## 2023-11-27 DIAGNOSIS — I10 Essential (primary) hypertension: Secondary | ICD-10-CM

## 2023-11-27 DIAGNOSIS — I70222 Atherosclerosis of native arteries of extremities with rest pain, left leg: Secondary | ICD-10-CM

## 2023-11-27 DIAGNOSIS — F17200 Nicotine dependence, unspecified, uncomplicated: Secondary | ICD-10-CM

## 2023-11-27 NOTE — Telephone Encounter (Signed)
 Patient left a message requesting got pain medication refill. Please Advise

## 2023-11-27 NOTE — Telephone Encounter (Signed)
 Oxycodone refill

## 2023-11-27 NOTE — Progress Notes (Signed)
 Subjective:    Patient ID: Yvonne [name], female    DOB: [birth year], 68 y.o.   MRN: [id number] Chief Complaint  Patient presents with   fu 6 weeks no studies    Patient returns for follow-up evaluation of her right below-knee amputation.  The wound is improving.  She had been doing physical therapy at home which greatly improved her range of motion in her knee.  We have placed a referral for outpatient physical therapy however they have not yet contacted the patient.  She has been contacted by [facility] and she will be meeting with them soon to begin with her steps for prosthetic.  She endorses having some worsening claudication symptoms in her left as well as what sounds to be the beginning of mild rest pain symptoms.  This is similar to the symptoms that she began having her right lower extremity as well.  She has not had ABIs in several months but her previous ABIs were notably decreased with a left ABI of 0.65 with a TBI 0.37.  She also had notable plaque throughout the left lower extremity with monophasic waveforms beginning at the proximal SFA.  Currently she does not have any open wounds or ulcerations.  At that time although focuses on her right leg and trying to obtain limb salvage but unfortunately this ultimately ended with an amputation.    Review of Systems  Musculoskeletal:  Positive for gait problem.  Skin:  Positive for wound.  All other systems reviewed and are negative.      Objective:   Physical Exam Vitals reviewed.  Cardiovascular:     Rate and Rhythm: Normal rate.  Pulmonary:     Effort: Pulmonary effort is normal.  Musculoskeletal:     Right Lower Extremity: Right leg is amputated below knee.  Skin:    General: Skin is warm and dry.  Neurological:     Mental Status: She is alert and oriented to person, place, and time.  Psychiatric:        Mood and Affect: Mood normal.        Behavior: Behavior normal.        Thought Content: Thought content normal.         Judgment: Judgment normal.     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)    Bilateral carotid artery stenosis    Dysrhythmia    Hyperlipidemia    Hypertension    Ischemic leg    Peripheral vascular disease (HCC)    PSVT (paroxysmal supraventricular tachycardia) (HCC)    Syncope     Social History   Socioeconomic History   Marital status: Divorced    Spouse name: Not on file   Number of children: 2   Years of education: Not on file   Highest education level: Not on file  Occupational History   Not on file  Tobacco Use   Smoking status: Former    Current packs/day: 0.00    Types: Cigarettes    Quit date: 10/27/2021    Years since quitting: 2.0   Smokeless tobacco: Never  Vaping Use   Vaping status: Never Used  Substance and Sexual Activity   Alcohol use: Not Currently    Comment: rarely   Drug use: Never   Sexual activity: Not  on file  Other Topics Concern   Not on file  Social History Narrative   Lives by herself now.    Social Drivers of Corporate investment banker Strain: Not on file  Food Insecurity: No Food Insecurity (05/09/2023)   Hunger Vital Sign    Worried About Running Out of Food in the Last Year: Never true    Ran Out of Food in the Last Year: Never true  Recent Concern: Food Insecurity - Food Insecurity Present (02/18/2023)   Hunger Vital Sign    Worried About Running Out of Food in the Last Year: Sometimes true    Ran Out of Food in the Last Year: Never true  Transportation Needs: No Transportation Needs (05/09/2023)   PRAPARE - Administrator, Civil Service (Medical): No    Lack of Transportation (Non-Medical): No  Physical Activity: Not on file  Stress: Not on file  Social Connections: Not on file  Intimate Partner Violence: Not At  Risk (05/09/2023)   Humiliation, Afraid, Rape, and Kick questionnaire    Fear of Current or Ex-Partner: No    Emotionally Abused: No    Physically Abused: No    Sexually Abused: No    Past Surgical History:  Procedure Laterality Date   ABDOMINAL HYSTERECTOMY     AMPUTATION Right 05/09/2023   Procedure: AMPUTATION BELOW KNEE;  Surgeon: Marea Selinda RAMAN, MD;  Location: [facility];  Service: Vascular;  Laterality: Right;   LOWER EXTREMITY ANGIOGRAPHY Right 10/02/2021   Procedure: Lower Extremity Angiography;  Surgeon: Marea Selinda RAMAN, MD;  Location: [facility];  Service: Cardiovascular;  Laterality: Right;   LOWER EXTREMITY ANGIOGRAPHY Left 10/09/2021   Procedure: Lower Extremity Angiography;  Surgeon: Marea Selinda RAMAN, MD;  Location: [facility];  Service: Cardiovascular;  Laterality: Left;   LOWER EXTREMITY ANGIOGRAPHY Right 10/27/2021   Procedure: Lower Extremity Angiography;  Surgeon: Marea Selinda RAMAN, MD;  Location: [facility];  Service: Cardiovascular;  Laterality: Right;   LOWER EXTREMITY ANGIOGRAPHY Right 02/15/2022   Procedure: Lower Extremity Angiography;  Surgeon: Marea Selinda RAMAN, MD;  Location: [facility];  Service: Cardiovascular;  Laterality: Right;   LOWER EXTREMITY ANGIOGRAPHY Right 02/28/2022   Procedure: Lower Extremity Angiography;  Surgeon: Marea Selinda RAMAN, MD;  Location: [facility];  Service: Cardiovascular;  Laterality: Right;   LOWER EXTREMITY ANGIOGRAPHY Right 02/18/2023   Procedure: Lower Extremity Angiography;  Surgeon: Marea Selinda RAMAN, MD;  Location: [facility];  Service: Cardiovascular;  Laterality: Right;   LOWER EXTREMITY ANGIOGRAPHY Right 02/19/2023   Procedure: Lower Extremity Angiography;  Surgeon: Marea Selinda RAMAN, MD;  Location: [facility];  Service: Cardiovascular;  Laterality: Right;   LOWER EXTREMITY INTERVENTION Right 10/27/2021   Procedure: LOWER EXTREMITY INTERVENTION;  Surgeon: Marea Selinda RAMAN, MD;  Location: [facility];  Service: Cardiovascular;  Laterality: Right;   LOWER EXTREMITY INTERVENTION Right 02/15/2022   Procedure: LOWER EXTREMITY INTERVENTION;  Surgeon: Marea Selinda RAMAN, MD;  Location: [facility];  Service: Cardiovascular;  Laterality: Right;   PARTIAL HYSTERECTOMY     PERIPHERAL VASCULAR THROMBECTOMY Right 03/01/2022   Procedure: PERIPHERAL VASCULAR THROMBECTOMY;  Surgeon: Marea Selinda RAMAN, MD;  Location: [facility];  Service: Cardiovascular;  Laterality: Right;    History reviewed. No pertinent family history.  No Known Allergies     Latest Ref Rng & Units 05/11/2023    4:07 AM 05/10/2023  5:17 AM 05/09/2023   12:04 PM  CBC  WBC 4.0 - 10.5 K/uL 9.7  10.6  6.8   Hemoglobin 12.0 - 15.0 g/dL 9.8  9.7  88.6   Hematocrit 36.0 - 46.0 % 30.2  29.5  35.4   Platelets 150 - 400 K/uL 298  299  374       CMP     Component Value Date/Time   NA 136 05/11/2023 0407   K 3.4 (L) 05/11/2023 0407   CL 104 05/11/2023 0407   CO2 24 05/11/2023 0407   GLUCOSE 111 (H) 05/11/2023 0407   BUN 8 05/11/2023 0407   CREATININE 0.51 05/11/2023 0407   CALCIUM  8.8 (L) 05/11/2023 0407   PROT 7.3 02/28/2022 1929   ALBUMIN 3.2 (L) 02/28/2022 1929   AST 27 02/28/2022 1929   ALT 23 02/28/2022 1929   ALKPHOS 134 (H) 02/28/2022 1929   BILITOT 0.7 02/28/2022 1929   GFRNONAA >60 05/11/2023 0407     No results found.     Assessment & Plan:   1. Atherosclerosis of native artery of left lower extremity with rest pain (HCC) (Primary) Recommend:  The patient has evidence of severe atherosclerotic changes of both lower extremities with rest pain that is associated with preulcerative changes and impending tissue loss of the l foot.  This represents a limb threatening ischemia and places the patient at the risk for left limb loss.  Patient should undergo angiography of the left lower extremity with the hope for intervention for limb salvage.  The risks and benefits as well as the  alternative therapies was discussed in detail with the patient.  All questions were answered.  Patient agrees to proceed with left lower extremity angiography.  The patient will follow up with me in the office after the procedure.      2. Hx of BKA, right (HCC) Patient's wound is improving on her right below-knee amputation.  She has an upcoming evaluation with [facility].  Her mobility is also improved.  3. Benign essential HTN Continue antihypertensive medications as already ordered, these medications have been reviewed and there are no changes at this time.  4. Smoker Smoking cessation was discussed, 3-10 minutes spent on this topic specifically   Current Outpatient Medications on File Prior to Visit  Medication Sig Dispense Refill   amLODipine  (NORVASC ) 10 MG tablet Take 1 tablet by mouth once daily 30 tablet 0   aspirin  EC 81 MG tablet Take 81 mg by mouth daily. Swallow whole.     atorvastatin  (LIPITOR) 20 MG tablet Take 20 mg by mouth daily.     cholecalciferol  (VITAMIN D3) 25 MCG (1000 UNIT) tablet Take 1,000 Units by mouth daily.     cyclobenzaprine  (FLEXERIL ) 10 MG tablet Take 1 tablet by mouth three times daily as needed for muscle spasm 30 tablet 3   diphenhydramine -acetaminophen  (TYLENOL  PM) 25-500 MG TABS tablet Take 1 tablet by mouth at bedtime as needed.     gabapentin  (NEURONTIN ) 300 MG capsule Take 1 capsule (300 mg total) by mouth at bedtime. 30 capsule 3   metoprolol  tartrate (LOPRESSOR ) 25 MG tablet Take 1 tablet (25 mg total) by mouth 2 (two) times daily. 60 tablet 3   oxyCODONE -acetaminophen  (PERCOCET/ROXICET) 5-325 MG tablet Take 1 tablet by mouth every 4 (four) hours as needed for severe pain (pain score 7-10). 30 tablet 0   rivaroxaban  (XARELTO ) 20 MG TABS tablet Take 1 tablet (20 mg total) by mouth daily. 30 tablet 3  No current facility-administered medications on file prior to visit.    There are no Patient Instructions on file for this visit. No  follow-ups on file.   Tinlee Navarrette E Ketty Bitton, NP

## 2023-11-27 NOTE — Telephone Encounter (Signed)
 Is she wanting gabapentin  or just pain medication?

## 2023-11-28 ENCOUNTER — Other Ambulatory Visit (INDEPENDENT_AMBULATORY_CARE_PROVIDER_SITE_OTHER): Payer: Self-pay | Admitting: Nurse Practitioner

## 2023-11-28 MED ORDER — OXYCODONE-ACETAMINOPHEN 5-325 MG PO TABS
1.0000 | ORAL_TABLET | ORAL | 0 refills | Status: DC | PRN
Start: 2023-11-28 — End: 2024-02-05

## 2023-11-28 NOTE — Telephone Encounter (Signed)
 sent

## 2023-12-03 ENCOUNTER — Other Ambulatory Visit (INDEPENDENT_AMBULATORY_CARE_PROVIDER_SITE_OTHER): Payer: Self-pay | Admitting: Nurse Practitioner

## 2024-02-03 ENCOUNTER — Ambulatory Visit
Admission: RE | Admit: 2024-02-03 | Discharge: 2024-02-03 | Disposition: A | Discharge status: Home / Self Care | Source: Ambulatory Visit | Attending: Family Medicine | Admitting: Family Medicine

## 2024-02-03 DIAGNOSIS — Z1231 Encounter for screening mammogram for malignant neoplasm of breast: Secondary | ICD-10-CM | POA: Insufficient documentation

## 2024-02-05 ENCOUNTER — Other Ambulatory Visit (INDEPENDENT_AMBULATORY_CARE_PROVIDER_SITE_OTHER): Payer: Self-pay | Admitting: Nurse Practitioner

## 2024-02-05 ENCOUNTER — Telehealth (INDEPENDENT_AMBULATORY_CARE_PROVIDER_SITE_OTHER): Payer: Self-pay

## 2024-02-05 MED ORDER — OXYCODONE-ACETAMINOPHEN 5-325 MG PO TABS: 1.0000 | ORAL_TABLET | ORAL | 0 refills | Status: DC | PRN

## 2024-02-05 NOTE — Telephone Encounter (Signed)
 Patient left a message requesting for pain medication refill to help relieve phantom pain. Please Advise

## 2024-02-06 NOTE — Telephone Encounter (Signed)
 Reach out to the patient but was not able to leave a voicemail.

## 2024-02-06 NOTE — Telephone Encounter (Signed)
 sent

## 2024-03-27 ENCOUNTER — Telehealth (INDEPENDENT_AMBULATORY_CARE_PROVIDER_SITE_OTHER): Payer: Self-pay

## 2024-03-27 ENCOUNTER — Other Ambulatory Visit (INDEPENDENT_AMBULATORY_CARE_PROVIDER_SITE_OTHER): Payer: Self-pay | Admitting: Nurse Practitioner

## 2024-03-27 MED ORDER — OXYCODONE-ACETAMINOPHEN 5-325 MG PO TABS: 1.0000 | ORAL_TABLET | ORAL | 0 refills | Status: DC | PRN

## 2024-03-27 NOTE — Telephone Encounter (Signed)
 Pt advised.

## 2024-03-27 NOTE — Telephone Encounter (Signed)
 Patient called into nurse triage line requesting refill on her Percocet. Please advise

## 2024-03-27 NOTE — Telephone Encounter (Signed)
 done

## 2024-04-16 ENCOUNTER — Other Ambulatory Visit (INDEPENDENT_AMBULATORY_CARE_PROVIDER_SITE_OTHER): Payer: Self-pay | Admitting: Vascular Surgery

## 2024-04-22 ENCOUNTER — Encounter: Payer: Self-pay | Admitting: *Deleted

## 2024-05-11 ENCOUNTER — Encounter: Admission: RE | Payer: Self-pay | Source: Home / Self Care

## 2024-05-11 ENCOUNTER — Ambulatory Visit: Admission: RE | Admit: 2024-05-11

## 2024-05-11 HISTORY — DX: Tubulo-interstitial nephritis, not specified as acute or chronic: N12

## 2024-05-11 HISTORY — DX: Palpitations: R00.2

## 2024-05-11 SURGERY — COLONOSCOPY
Anesthesia: General

## 2024-05-13 ENCOUNTER — Telehealth (INDEPENDENT_AMBULATORY_CARE_PROVIDER_SITE_OTHER): Payer: Self-pay

## 2024-05-13 NOTE — Telephone Encounter (Signed)
 Patient reach out to triage nurse stating that she is having pain 7 out 10 at the amputation site. Patient is requesting for pain medication refill. Patient did declined triage with the nurse. I did reach out to the patient to receive more information. Please Advise

## 2024-05-14 NOTE — Telephone Encounter (Signed)
 Patient stated that she has been having pain for past 3-4 days with the right stump. She is requesting for pain medication for pain relief.

## 2024-05-14 NOTE — Telephone Encounter (Signed)
 Let's see what sort of pain she is having and how long it has been ongoing

## 2024-05-15 ENCOUNTER — Other Ambulatory Visit (INDEPENDENT_AMBULATORY_CARE_PROVIDER_SITE_OTHER): Payer: Self-pay | Admitting: Nurse Practitioner

## 2024-05-15 MED ORDER — OXYCODONE-ACETAMINOPHEN 5-325 MG PO TABS: 1.0000 | ORAL_TABLET | Freq: Three times a day (TID) | ORAL | 0 refills | Status: AC | PRN

## 2024-05-15 NOTE — Telephone Encounter (Signed)
 Patient was notified with medical recommendations and verbalized that she will contact the office next month when her insurance changes to scheduled appt.

## 2024-05-15 NOTE — Telephone Encounter (Signed)
 I sent in pain medication but I note that she has cancelled all of her previous follow ups.  I will not send any more pain medication until she's been seen in f/u

## 2024-05-29 ENCOUNTER — Other Ambulatory Visit (INDEPENDENT_AMBULATORY_CARE_PROVIDER_SITE_OTHER): Payer: Self-pay | Admitting: Nurse Practitioner

## 2024-06-29 ENCOUNTER — Telehealth (INDEPENDENT_AMBULATORY_CARE_PROVIDER_SITE_OTHER): Payer: Self-pay | Admitting: Nurse Practitioner

## 2024-06-29 NOTE — Telephone Encounter (Signed)
 Incoming call from [place], [place] @ [phone], female stated he was calling from patient's insurance and patient is unable to schedule appointment at [facility] until next month otherwise she will be responsible to pay 20% coinsurance. Advised female that we were waiting for pt to call back to make appointment.

## 2024-06-29 NOTE — Telephone Encounter (Signed)
 Patient LVM for [facility] to make fu appointment. Tried to call pt back x2 no answer and no voicemail to leave message
